# Patient Record
Sex: Male | Born: 1955 | Race: White | Hispanic: No | Marital: Married | Smoking: Former smoker
Health system: Southern US, Community
[De-identification: ages and names within clinical notes are randomized; demographics above are authoritative.]

## PROBLEM LIST (undated history)

## (undated) DIAGNOSIS — M7042 Prepatellar bursitis, left knee: Secondary | ICD-10-CM

## (undated) DIAGNOSIS — C801 Malignant (primary) neoplasm, unspecified: Secondary | ICD-10-CM

## (undated) HISTORY — PX: URETHRAL STRICTURE DILATATION: SHX477

## (undated) HISTORY — DX: Prepatellar bursitis, left knee: M70.42

---

## 1988-05-18 HISTORY — PX: INGUINAL HERNIA REPAIR: SHX194

## 2010-05-18 HISTORY — PX: OTHER SURGICAL HISTORY: SHX169

## 2011-02-12 LAB — HM COLONOSCOPY

## 2013-04-23 ENCOUNTER — Inpatient Hospital Stay: Payer: Self-pay | Admitting: Surgery

## 2013-04-23 LAB — COMPREHENSIVE METABOLIC PANEL
Albumin: 3.8 g/dL (ref 3.4–5.0)
Anion Gap: 10 (ref 7–16)
BUN: 18 mg/dL (ref 7–18)
Bilirubin,Total: 0.6 mg/dL (ref 0.2–1.0)
Co2: 22 mmol/L (ref 21–32)
EGFR (Non-African Amer.): >60
Glucose: 120 mg/dL — ABNORMAL HIGH (ref 65–99)
Osmolality: 277 (ref 275–301)
Potassium: 3.8 mmol/L (ref 3.5–5.1)
Total Protein: 7.5 g/dL (ref 6.4–8.2)

## 2013-04-23 LAB — CBC
HCT: 37.4 % — ABNORMAL LOW (ref 40.0–52.0)
HGB: 13.1 g/dL (ref 13.0–18.0)
MCH: 33.5 pg (ref 26.0–34.0)
RBC: 3.9 10*6/uL — ABNORMAL LOW (ref 4.40–5.90)
RDW: 12.6 % (ref 11.5–14.5)
WBC: 8 10*3/uL (ref 3.8–10.6)

## 2013-04-23 LAB — PRO B NATRIURETIC PEPTIDE: B-Type Natriuretic Peptide: 136 pg/mL — ABNORMAL HIGH (ref 0–125)

## 2013-05-03 ENCOUNTER — Ambulatory Visit: Payer: Self-pay | Admitting: Surgery

## 2014-09-07 NOTE — H&P (Signed)
PATIENT NAME:  James [name], James [name] MR#:  [id number] DATE OF BIRTH:  [birth year]  DATE OF ADMISSION:  04/23/2013  CHIEF COMPLAINT: A chest pain.   HISTORY OF PRESENT ILLNESS: This is a patient with a history of chest pain. He woke up with it on Thursday morning, three days ago. He was perfectly fine the day before. Had been moving some sheetrock the day before, but he does that typically and had no history of trauma on the work site that day before. He has had shortness of breath and especially right pain in the chest.   A chest tube was placed by Dr. Jasmine December Emergency Room. I was asked to see seven admit the patient for continued care. He has a well expanded lung on subsequent x-rays.   PAST MEDICAL HISTORY: None.   PAST SURGICAL HISTORY: Inguinal hernia repair with recurrence and laparoscopic approach and then in a urethral stricture repair.   ALLERGIES: None.   MEDICATIONS: Aspirin daily.   FAMILY HISTORY: Noncontributory.   SOCIAL HISTORY: The patient smokes on occasion. He drinks on occasion and works as a Geographical information systems officer, Animal nutritionist.   REVIEW OF SYSTEMS: A 10 system review is performed and negative with the exception of that mentioned in the history of present illness.   PHYSICAL EXAMINATION: GENERAL: Healthy, somewhat uncomfortable-appearing male patient. He has an obvious right chest tube in place.  VITAL SIGNS: Temperature of 97.7. Pulse of 80, respirations 18, blood pressure 149/94 97% room air sat. Pain scale of zero, is recorded, but he states that he is having considerably more pain than that.   HEENT: No scleral icterus.  NECK: No palpable neck nodes.  CHEST: Clear to auscultation. Bilateral breath sounds are noted.  CARDIAC: Regular rate and rhythm.  ABDOMEN: Soft and nontender.  EXTREMITIES: Without edema.  NEUROLOGIC: Grossly intact.  INTEGUMENT: No jaundice. A right chest tube is present with no obvious air leak.   Chest x-rays are reviewed  showing pigtail catheter on the right side, in good position with good expansion of the lung following obvious right pneumothorax. Electrolytes are within normal limits as is the hemogram.   ASSESSMENT AND PLAN: This is a patient with a right pneumothorax. He has no other medical problems. He is re-expanded with pigtail catheter placed by Dr. Jasmine December. He will be admitted to the hospital and kept on -20 cm of suction and an a chest x-ray will be ordered for the morning at which time his chest tube will likely be able to be removed and he will be discharged, at least anticipated discharge. This was discussed with the patient and his wife. They understood and agreed to proceed.     ____________________________ Jerrol Banana Burt Knack, MD rec:cc D: 04/23/2013 16:10:42 ET T: 04/23/2013 18:33:38 ET JOB#: [id number]  cc: Jerrol Banana. Burt Knack, MD, <Dictator> Florene Glen MD ELECTRONICALLY SIGNED 04/27/2013 12:39

## 2015-07-31 ENCOUNTER — Ambulatory Visit (INDEPENDENT_AMBULATORY_CARE_PROVIDER_SITE_OTHER): Payer: BC Managed Care – PPO | Admitting: Family Medicine

## 2015-07-31 ENCOUNTER — Encounter: Payer: Self-pay | Admitting: Family Medicine

## 2015-07-31 VITALS — BP 131/87 | HR 74 | Temp 96.9°F | Ht 67.7 in | Wt 157.0 lb

## 2015-07-31 DIAGNOSIS — D229 Melanocytic nevi, unspecified: Secondary | ICD-10-CM

## 2015-07-31 DIAGNOSIS — Z1159 Encounter for screening for other viral diseases: Secondary | ICD-10-CM

## 2015-07-31 DIAGNOSIS — Z20820 Contact with and (suspected) exposure to varicella: Secondary | ICD-10-CM

## 2015-07-31 DIAGNOSIS — Z0000 Encounter for general adult medical examination without abnormal findings: Secondary | ICD-10-CM | POA: Diagnosis not present

## 2015-07-31 LAB — UA/M W/RFLX CULTURE, ROUTINE
BILIRUBIN UA: NEGATIVE
GLUCOSE, UA: NEGATIVE
Ketones, UA: NEGATIVE
Leukocytes, UA: NEGATIVE
Nitrite, UA: NEGATIVE
PH UA: 6.5 (ref 5.0–7.5)
PROTEIN UA: NEGATIVE
RBC UA: NEGATIVE
SPEC GRAV UA: 1.02 (ref 1.005–1.030)
UUROB: 0.2 mg/dL (ref 0.2–1.0)

## 2015-07-31 NOTE — Progress Notes (Signed)
BP 131/87 mmHg  Pulse 74  Temp(Src) 96.9 F (36.1 C)  Ht 5' 7.7" (1.72 m)  Wt 157 lb (71.215 kg)  BMI 24.07 kg/m2  SpO2 98%   Subjective:    Patient ID: James [name], male    DOB: 1956-01-31, 60 y.o.   MRN: [id number]  HPI: James [name] is a 60 y.o. male presenting on 07/31/2015 for comprehensive medical examination. Current medical complaints include:  SKIN LESION- Has had a mole on the side of his scrotum for year, but it has been cracking and bleeding in the last month or so and giving a bad smell. Duration: 30 years Location: side of his scrotum Painful: yes Itching: no Onset: gradual Context: cracking and bleeding Associated signs and symptoms: none History of skin cancer: no History of precancerous skin lesions: no Family history of skin cancer: no  He currently lives with: Interim Problems from his last visit: no  Depression Screen done today and results listed below:  Depression screen [facility] 2/9 07/31/2015 07/31/2015  Decreased Interest 0 0  Down, Depressed, Hopeless 0 0  PHQ - 2 Score 0 0    The patient does not have a history of falls. I did not complete a risk assessment for falls. A plan of care for falls was not documented.   Past Medical History:  History reviewed. No pertinent past medical history.  Surgical History:  Past Surgical History  Procedure Laterality Date  . Hernia repair      X 2  . Urethral stricture dilatation      Medications:  No current outpatient prescriptions on file prior to visit.   No current facility-administered medications on file prior to visit.    Allergies:  No Known Allergies  Social History:  Social History   Social History  . Marital Status: Married    Spouse Name: N/A  . Number of Children: N/A  . Years of Education: N/A   Occupational History  . Not on file.   Social History Main Topics  . Smoking status: Former Smoker    Quit date: 03/18/2012  . Smokeless tobacco: Never Used  . Alcohol Use: Yes   Comment: Socially  . Drug Use: Yes    Special: Marijuana  . Sexual Activity: Not Currently   Other Topics Concern  . Not on file   Social History Narrative   History  Smoking status  . Former Smoker  . Quit date: 03/18/2012  Smokeless tobacco  . Never Used   History  Alcohol Use  . Yes    Comment: Socially    Family History:  History reviewed. No pertinent family history.  Past medical history, surgical history, medications, allergies, family history and social history reviewed with patient today and changes made to appropriate areas of the chart.   Review of Systems  Constitutional: Negative.   HENT: Negative.   Eyes: Negative.   Respiratory: Negative.   Cardiovascular: Negative.   Gastrointestinal: Negative.   Genitourinary: Negative.        Slightly weaker stream  Musculoskeletal: Negative.   Skin: Negative.   Neurological: Negative.   Endo/Heme/Allergies: Negative for environmental allergies and polydipsia. Bruises/bleeds easily.  Psychiatric/Behavioral: Negative.     All other ROS negative except what is listed above and in the HPI.      Objective:    BP 131/87 mmHg  Pulse 74  Temp(Src) 96.9 F (36.1 C)  Ht 5' 7.7" (1.72 m)  Wt 157 lb (71.215 kg)  BMI 24.07 kg/m2  SpO2  98%  Wt Readings from Last 3 Encounters:  07/31/15 157 lb (71.215 kg)  07/30/15 160 lb (72.576 kg)    Physical Exam  Constitutional: He is oriented to person, place, and time. He appears well-developed and well-nourished. No distress.  HENT:  Head: Normocephalic and atraumatic.  Right Ear: Hearing, tympanic membrane, external ear and ear canal normal.  Left Ear: Hearing, tympanic membrane, external ear and ear canal normal.  Nose: Nose normal.  Mouth/Throat: Uvula is midline, oropharynx is clear and moist and mucous membranes are normal. No oropharyngeal exudate.  Eyes: Conjunctivae, EOM and lids are normal. Pupils are equal, round, and reactive to light. Right eye exhibits no  discharge. Left eye exhibits no discharge. No scleral icterus.  Neck: Normal range of motion. Neck supple. No JVD present. No tracheal deviation present. No thyromegaly present.  Cardiovascular: Normal rate, regular rhythm, normal heart sounds and intact distal pulses.  Exam reveals no gallop and no friction rub.   No murmur heard. Pulmonary/Chest: Effort normal and breath sounds normal. No stridor. No respiratory distress. He has no wheezes. He has no rales. He exhibits no tenderness.  Abdominal: Soft. Bowel sounds are normal. He exhibits no distension and no mass. There is no tenderness. There is no rebound and no guarding. Hernia confirmed negative in the right inguinal area and confirmed negative in the left inguinal area.  Genitourinary: Testes normal and penis normal. Uncircumcised. No penile tenderness.  Rectal exam deferred  Musculoskeletal: Normal range of motion. He exhibits no edema or tenderness.  Lymphadenopathy:    He has no cervical adenopathy.       Right: No inguinal adenopathy present.       Left: No inguinal adenopathy present.  Neurological: He is alert and oriented to person, place, and time. He has normal reflexes. He displays normal reflexes. No cranial nerve deficit. He exhibits normal muscle tone. Coordination normal.  Skin: Skin is warm, dry and intact. No rash noted. He is not diaphoretic. No erythema. No pallor.     Psychiatric: He has a normal mood and affect. His speech is normal and behavior is normal. Judgment and thought content normal. Cognition and memory are normal.  Nursing note and vitals reviewed.   Results for orders placed or performed in visit on 07/31/15  HM COLONOSCOPY  Result Value Ref Range   HM Colonoscopy Repeat in 10 years- [place]:   Problem List Items Addressed This Visit    None    Visit Diagnoses    Routine general medical examination at a health care facility    -  Primary    Refuses vaccines.  Screening labs checked today. Continue diet and exercise. Continue to monitor.     Relevant Orders    CBC with Differential/Platelet    Comprehensive metabolic panel    Lipid Panel w/o Chol/HDL Ratio    PSA    TSH    UA/M w/rflx Culture, Routine    Varicella zoster antibody, IgG    Need for hepatitis C screening test        Screening labs checked today. Await results.    Relevant Orders    Hepatitis C Antibody    Exposure to varicella        Not sure if he ever had it. Will check titre and vaccinate if needed.     Relevant Orders    Varicella zoster antibody, IgG    Nevus  Large and starting to crack and bleed. Wants removal on the R side of his scrotum- referral to dermatology made today.    Relevant Orders    Ambulatory referral to Dermatology        LABORATORY TESTING:  Health maintenance labs ordered today as discussed above.   The natural history of prostate cancer and ongoing controversy regarding screening and potential treatment outcomes of prostate cancer has been discussed with the patient. The meaning of a false positive PSA and a false negative PSA has been discussed. He indicates understanding of the limitations of this screening test and wishes to proceed with screening PSA testing.   IMMUNIZATIONS:   - Tdap: Tetanus vaccination status reviewed: Refused. - Influenza: Refused - Pneumovax: Refused - Prevnar: Not applicable - Zostavax vaccine: Refused  SCREENING: - Colonoscopy: Up to date  Discussed with patient purpose of the colonoscopy is to detect colon cancer at curable precancerous or early stages   PATIENT COUNSELING:    Sexuality: Discussed sexually transmitted diseases, partner selection, use of condoms, avoidance of unintended pregnancy  and contraceptive alternatives.   Advised to avoid cigarette smoking.  I discussed with the patient that most people either abstain from alcohol or drink within safe limits (<=14/week and <=4 drinks/occasion  for males, <=7/weeks and <= 3 drinks/occasion for females) and that the risk for alcohol disorders and other health effects rises proportionally with the number of drinks per week and how often a drinker exceeds daily limits.  Discussed cessation/primary prevention of drug use and availability of treatment for abuse.   Diet: Encouraged to adjust caloric intake to maintain  or achieve ideal body weight, to reduce intake of dietary saturated fat and total fat, to limit sodium intake by avoiding high sodium foods and not adding table salt, and to maintain adequate dietary potassium and calcium preferably from fresh fruits, vegetables, and low-fat dairy products.    stressed the importance of regular exercise  Injury prevention: Discussed safety belts, safety helmets, smoke detector, smoking near bedding or upholstery.   Dental health: Discussed importance of regular tooth brushing, flossing, and dental visits.   Follow up plan: NEXT PREVENTATIVE PHYSICAL DUE IN 1 YEAR. Return in about 1 year (around 07/30/2016) for Physical.

## 2015-07-31 NOTE — Patient Instructions (Signed)

## 2015-08-01 ENCOUNTER — Encounter: Payer: Self-pay | Admitting: Family Medicine

## 2015-08-01 ENCOUNTER — Telehealth: Payer: Self-pay | Admitting: Family Medicine

## 2015-08-01 DIAGNOSIS — Z125 Encounter for screening for malignant neoplasm of prostate: Secondary | ICD-10-CM

## 2015-08-01 LAB — CBC WITH DIFFERENTIAL/PLATELET
Basophils Absolute: 0 10*3/uL (ref 0.0–0.2)
Basos: 0 %
EOS (ABSOLUTE): 0.3 10*3/uL (ref 0.0–0.4)
Eos: 5 %
Hematocrit: 37.9 % (ref 37.5–51.0)
Hemoglobin: 12.9 g/dL (ref 12.6–17.7)
Immature Grans (Abs): 0 10*3/uL (ref 0.0–0.1)
Immature Granulocytes: 0 %
Lymphocytes Absolute: 1.3 10*3/uL (ref 0.7–3.1)
Lymphs: 23 %
MCH: 32.8 pg (ref 26.6–33.0)
MCHC: 34 g/dL (ref 31.5–35.7)
MCV: 96 fL (ref 79–97)
Monocytes Absolute: 0.5 10*3/uL (ref 0.1–0.9)
Monocytes: 8 %
Neutrophils Absolute: 3.8 10*3/uL (ref 1.4–7.0)
Neutrophils: 64 %
Platelets: 256 10*3/uL (ref 150–379)
RBC: 3.93 x10E6/uL — ABNORMAL LOW (ref 4.14–5.80)
RDW: 13.6 % (ref 12.3–15.4)
WBC: 5.9 10*3/uL (ref 3.4–10.8)

## 2015-08-01 LAB — VARICELLA ZOSTER ANTIBODY, IGG

## 2015-08-01 LAB — COMPREHENSIVE METABOLIC PANEL
ALBUMIN: 4.3 g/dL (ref 3.6–4.8)
ALK PHOS: 62 IU/L (ref 39–117)
ALT: 12 IU/L (ref 0–44)
AST: 16 IU/L (ref 0–40)
Albumin/Globulin Ratio: 1.9 (ref 1.2–2.2)
BILIRUBIN TOTAL: 0.5 mg/dL (ref 0.0–1.2)
BUN/Creatinine Ratio: 14 (ref 10–22)
BUN: 15 mg/dL (ref 8–27)
CHLORIDE: 103 mmol/L (ref 96–106)
CO2: 24 mmol/L (ref 18–29)
CREATININE: 1.06 mg/dL (ref 0.76–1.27)
Calcium: 9.9 mg/dL (ref 8.6–10.2)
GFR calc Af Amer: 87 mL/min/{1.73_m2} (ref >59)
GFR calc non Af Amer: 75 mL/min/{1.73_m2} (ref >59)
GLUCOSE: 86 mg/dL (ref 65–99)
Globulin, Total: 2.3 g/dL (ref 1.5–4.5)
Potassium: 5.2 mmol/L (ref 3.5–5.2)
Sodium: 141 mmol/L (ref 134–144)
Total Protein: 6.6 g/dL (ref 6.0–8.5)

## 2015-08-01 LAB — LIPID PANEL W/O CHOL/HDL RATIO
Cholesterol, Total: 212 mg/dL — ABNORMAL HIGH (ref 100–199)
HDL: 61 mg/dL
LDL Calculated: 134 mg/dL — ABNORMAL HIGH (ref 0–99)
Triglycerides: 86 mg/dL (ref 0–149)
VLDL Cholesterol Cal: 17 mg/dL (ref 5–40)

## 2015-08-01 LAB — PSA: Prostate Specific Ag, Serum: 2.3 ng/mL (ref 0.0–4.0)

## 2015-08-01 LAB — TSH: TSH: 2.57 u[IU]/mL (ref 0.450–4.500)

## 2015-08-01 LAB — HEPATITIS C ANTIBODY: Hep C Virus Ab: <0.1 s/co ratio (ref 0.0–0.9)

## 2015-08-01 NOTE — Telephone Encounter (Signed)
Called to give James [name] the results of his blood work. Everything looks good, but his prostate marker went from 1.5 to 2.3 over the course of a year. It's still in the normal range, but because it jumped up so much I'd like to check it in 6 months rather and 12. He doesn't need an appointment with me, just a lab visit. He also HAS had chicken pox, so if he does want the shingles vaccine, he can get it, but he doesn't need to be vaccinated against chicken pox. Everything else looks good and I'm sending the results to him in a letter. OK to give him this message if he calls.

## 2015-08-02 NOTE — Telephone Encounter (Signed)
Appointment scheduled, patient notified.

## 2015-08-02 NOTE — Telephone Encounter (Signed)
Pt called in and would a call back regarding his chicken pox vaccine.

## 2015-08-02 NOTE — Telephone Encounter (Signed)
Please call patient

## 2015-08-02 NOTE — Telephone Encounter (Signed)
Called and spoke to patient. Will have follow up PSA in 6 months. Please book appointment and let patient know when it will be. Thanks!

## 2015-11-26 ENCOUNTER — Ambulatory Visit (INDEPENDENT_AMBULATORY_CARE_PROVIDER_SITE_OTHER): Payer: BC Managed Care – PPO | Admitting: Family Medicine

## 2015-11-26 ENCOUNTER — Encounter: Payer: Self-pay | Admitting: Family Medicine

## 2015-11-26 VITALS — BP 137/87 | HR 68 | Temp 98.0°F | Wt 155.0 lb

## 2015-11-26 DIAGNOSIS — T8579XA Infection and inflammatory reaction due to other internal prosthetic devices, implants and grafts, initial encounter: Secondary | ICD-10-CM | POA: Diagnosis not present

## 2015-11-26 DIAGNOSIS — IMO0001 Reserved for inherently not codable concepts without codable children: Secondary | ICD-10-CM

## 2015-11-26 NOTE — Progress Notes (Signed)
BP 137/87 mmHg  Pulse 68  Temp(Src) 98 F (36.7 C)  Wt 155 lb (70.308 kg)  SpO2 98%   Subjective:    Patient ID: James [name], male    DOB: [birth year], 60 y.o.   MRN: [id number]  HPI: James [name] is a 60 y.o. male  Chief Complaint  Patient presents with  . low abdominal pain    Patient states that he is having pain in his lower abdomen where he had his hernia surgery which has a mesh in place. This has had to be repaired alreay, he is unsure if there is another problem with the mesh   Lower Abdominal Pain- had laproscopic surgery in the 1990s, this was due to a failed previous hernia and he had it repaired Duration: couple of weeks Location: Left groin Painful: yes Discomfort: yes Bulge: no Quality:  stretching, sharp and aching Onset: sudden 2-3 weeks ago Severity: 6/10 when he moves a certain way Context: fluctuating Aggravating factors: movements  Relevant past medical, surgical, family and social history reviewed and updated as indicated. Interim medical history since our last visit reviewed. Allergies and medications reviewed and updated.  Review of Systems  Constitutional: Negative.   Respiratory: Negative.   Cardiovascular: Negative.   Gastrointestinal: Positive for abdominal pain. Negative for nausea, vomiting, diarrhea, constipation, blood in stool, abdominal distention, anal bleeding and rectal pain.  Genitourinary: Negative for dysuria, urgency, frequency, hematuria, flank pain, decreased urine volume, discharge, penile swelling, scrotal swelling, enuresis, difficulty urinating, genital sores, penile pain and testicular pain.    Per HPI unless specifically indicated above     Objective:    BP 137/87 mmHg  Pulse 68  Temp(Src) 98 F (36.7 C)  Wt 155 lb (70.308 kg)  SpO2 98%  Wt Readings from Last 3 Encounters:  11/26/15 155 lb (70.308 kg)  07/31/15 157 lb (71.215 kg)  07/30/15 160 lb (72.576 kg)    Physical Exam  Constitutional: He is oriented to  person, place, and time. He appears well-developed and well-nourished. No distress.  HENT:  Head: Normocephalic and atraumatic.  Right Ear: Hearing normal.  Left Ear: Hearing normal.  Nose: Nose normal.  Eyes: Conjunctivae and lids are normal. Right eye exhibits no discharge. Left eye exhibits no discharge. No scleral icterus.  Pulmonary/Chest: Effort normal. No respiratory distress.  Abdominal: Hernia confirmed negative in the right inguinal area and confirmed negative in the left inguinal area.  Genitourinary: Testes normal and penis normal. Cremasteric reflex is present. Right testis shows no mass, no swelling and no tenderness. Right testis is descended. Cremasteric reflex is not absent on the right side. Left testis shows no mass, no swelling and no tenderness. Left testis is descended. Cremasteric reflex is not absent on the left side. Uncircumcised.  Musculoskeletal: Normal range of motion.  Neurological: He is alert and oriented to person, place, and time.  Skin: Skin is warm, dry and intact. No rash noted. No erythema. No pallor.  Psychiatric: He has a normal mood and affect. His speech is normal and behavior is normal. Judgment and thought content normal. Cognition and memory are normal.  Nursing note and vitals reviewed.   Results for orders placed or performed in visit on 07/31/15  CBC with Differential/Platelet  Result Value Ref Range   WBC 5.9 3.4 - 10.8 x10E3/uL   RBC 3.93 (L) 4.14 - 5.80 x10E6/uL   Hemoglobin 12.9 12.6 - 17.7 g/dL   Hematocrit 37.9 37.5 - 51.0 %   MCV 96 79 - 97  fL   MCH 32.8 26.6 - 33.0 pg   MCHC 34.0 31.5 - 35.7 g/dL   RDW 13.6 12.3 - 15.4 %   Platelets 256 150 - 379 x10E3/uL   Neutrophils 64 %   Lymphs 23 %   Monocytes 8 %   Eos 5 %   Basos 0 %   Neutrophils Absolute 3.8 1.4 - 7.0 x10E3/uL   Lymphocytes Absolute 1.3 0.7 - 3.1 x10E3/uL   Monocytes Absolute 0.5 0.1 - 0.9 x10E3/uL   EOS (ABSOLUTE) 0.3 0.0 - 0.4 x10E3/uL   Basophils Absolute 0.0  0.0 - 0.2 x10E3/uL   Immature Granulocytes 0 %   Immature Grans (Abs) 0.0 0.0 - 0.1 x10E3/uL  Comprehensive metabolic panel  Result Value Ref Range   Glucose 86 65 - 99 mg/dL   BUN 15 8 - 27 mg/dL   Creatinine, Ser 1.06 0.76 - 1.27 mg/dL   GFR calc non Af Amer 75 >59 mL/min/1.73   GFR calc Af Amer 87 >59 mL/min/1.73   BUN/Creatinine Ratio 14 10 - 22   Sodium 141 134 - 144 mmol/L   Potassium 5.2 3.5 - 5.2 mmol/L   Chloride 103 96 - 106 mmol/L   CO2 24 18 - 29 mmol/L   Calcium 9.9 8.6 - 10.2 mg/dL   Total Protein 6.6 6.0 - 8.5 g/dL   Albumin 4.3 3.6 - 4.8 g/dL   Globulin, Total 2.3 1.5 - 4.5 g/dL   Albumin/Globulin Ratio 1.9 1.2 - 2.2   Bilirubin Total 0.5 0.0 - 1.2 mg/dL   Alkaline Phosphatase 62 39 - 117 IU/L   AST 16 0 - 40 IU/L   ALT 12 0 - 44 IU/L  Lipid Panel w/o Chol/HDL Ratio  Result Value Ref Range   Cholesterol, Total 212 (H) 100 - 199 mg/dL   Triglycerides 86 0 - 149 mg/dL   HDL 61 >39 mg/dL   VLDL Cholesterol Cal 17 5 - 40 mg/dL   LDL Calculated 134 (H) 0 - 99 mg/dL  PSA  Result Value Ref Range   Prostate Specific Ag, Serum 2.3 0.0 - 4.0 ng/mL  TSH  Result Value Ref Range   TSH 2.570 0.450 - 4.500 uIU/mL  UA/M w/rflx Culture, Routine  Result Value Ref Range   Specific Gravity, UA 1.020 1.005 - 1.030   pH, UA 6.5 5.0 - 7.5   Color, UA Yellow Yellow   Appearance Ur Clear Clear   Leukocytes, UA Negative Negative   Protein, UA Negative Negative/Trace   Glucose, UA Negative Negative   Ketones, UA Negative Negative   RBC, UA Negative Negative   Bilirubin, UA Negative Negative   Urobilinogen, Ur 0.2 0.2 - 1.0 mg/dL   Nitrite, UA Negative Negative  Hepatitis C Antibody  Result Value Ref Range   Hep C Virus Ab CANCELED s/co ratio  Varicella zoster antibody, IgG  Result Value Ref Range   Varicella zoster IgG CANCELED index  Hepatitis C antibody  Result Value Ref Range   Hep C Virus Ab <0.1 0.0 - 0.9 s/co ratio  Varicella zoster antibody, IgG  Result Value  Ref Range   Varicella zoster IgG >4000 Immune >165 index  HM COLONOSCOPY  Result Value Ref Range   HM Colonoscopy Repeat in 10 years- [facility]       Assessment & Plan:   Problem List Items Addressed This Visit    None    Visit Diagnoses    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 No sign of hernia. Will get him back in to see the surgeon. Call with any concerns.     Relevant Orders    Ambulatory referral to General Surgery        Follow up plan: Return 3-4 months.

## 2015-12-18 ENCOUNTER — Ambulatory Visit: Payer: BC Managed Care – PPO | Admitting: Family Medicine

## 2016-01-14 ENCOUNTER — Other Ambulatory Visit
Admission: RE | Admit: 2016-01-14 | Discharge: 2016-01-14 | Disposition: A | Discharge status: Home / Self Care | Payer: BC Managed Care – PPO | Source: Ambulatory Visit | Attending: Surgery | Admitting: Surgery

## 2016-01-14 ENCOUNTER — Ambulatory Visit (INDEPENDENT_AMBULATORY_CARE_PROVIDER_SITE_OTHER): Payer: BC Managed Care – PPO | Admitting: Surgery

## 2016-01-14 ENCOUNTER — Encounter: Payer: Self-pay | Admitting: Surgery

## 2016-01-14 VITALS — BP 145/88 | HR 68 | Temp 98.5°F | Ht 67.0 in | Wt 155.0 lb

## 2016-01-14 DIAGNOSIS — R1031 Right lower quadrant pain: Secondary | ICD-10-CM

## 2016-01-14 DIAGNOSIS — K4091 Unilateral inguinal hernia, without obstruction or gangrene, recurrent: Secondary | ICD-10-CM

## 2016-01-14 DIAGNOSIS — R1030 Lower abdominal pain, unspecified: Secondary | ICD-10-CM | POA: Diagnosis present

## 2016-01-14 LAB — CREATININE, SERUM: Creatinine, Ser: 1.04 mg/dL (ref 0.61–1.24)

## 2016-01-14 LAB — BUN: BUN: 19 mg/dL (ref 6–20)

## 2016-01-14 NOTE — Patient Instructions (Signed)
We have scheduled a CT scan on 01/28/16 '@9'$ :00 am. [address] Lorina Rabon [place] Please see your follow up appointment listed below.  We need you to go to the lab today. Please call our office if you have questions or concerns.

## 2016-01-14 NOTE — Progress Notes (Signed)
Surgical Consultation  01/14/2016  James [name] is an 60 y.o. male.   CC this patient with bilateral groin pain starting 6 months ago  HPI: This patient had bilateral laparoscopic hernias repaired back in the 90s at [facility] and had a prompt recurrence and he thinks that recurrence was on the left side requiring an open procedure mesh was utilized. Its to both sides stating that he has had pain for 6 months worsen with difficult bowel movements he is not frequently constipated he has had a colonoscopy within the last year and has no other problems no nausea vomiting fevers or chills and no weight loss.  History reviewed. No pertinent past medical history.  Past Surgical History:  Procedure Laterality Date  . collapsed lung  2012  . HERNIA REPAIR     X 2 with mesh  . INGUINAL HERNIA REPAIR  1990  . URETHRAL STRICTURE DILATATION      History reviewed. No pertinent family history.  Social History:  reports that he quit smoking about 3 years ago. He has never used smokeless tobacco. He reports that he drinks alcohol. He reports that he uses drugs, including Marijuana.  Allergies: No Known Allergies  Medications reviewed.   Review of Systems:   Review of Systems  Constitutional: Negative.   HENT: Negative.   Eyes: Negative.   Respiratory: Negative.   Cardiovascular: Negative.   Gastrointestinal: Positive for abdominal pain. Negative for blood in stool, constipation, diarrhea, heartburn, melena, nausea and vomiting.  Genitourinary: Negative for dysuria and urgency.  Musculoskeletal: Negative.   Skin: Negative.   Neurological: Negative.   Endo/Heme/Allergies: Negative.   Psychiatric/Behavioral: Negative.      Physical Exam:  BP (!) 145/88 (BP Location: Right Arm, Patient Position: Sitting, Cuff Size: Normal)   Pulse 68   Temp 98.5 F (36.9 C) (Oral)   Ht '5\' 7"'$  (1.702 m)   Wt 155 lb (70.3 kg)   BMI 24.28 kg/m   Physical Exam  Constitutional: He is oriented to  person, place, and time and well-developed, well-nourished, and in no distress. No distress.  HENT:  Head: Normocephalic and atraumatic.  Eyes: Right eye exhibits no discharge. Left eye exhibits no discharge. No scleral icterus.  Neck: Normal range of motion.  Cardiovascular: Normal rate, regular rhythm and normal heart sounds.   Pulmonary/Chest: Effort normal and breath sounds normal. No respiratory distress. He has no wheezes. He has no rales.  Abdominal: Soft. He exhibits no distension. There is no tenderness. There is no rebound and no guarding.  Genitourinary: Penis normal. No discharge found.  Genitourinary Comments: Patient examined standing supine and with Valsalva. There is a right-sided groin scar and laparoscopy scars this suggests that the right side was a recurrence that was repaired at [facility]. He clearly has a left side recurrence but not so clearly on the right side that has had 2 surgeries. Her normal  Musculoskeletal: Normal range of motion. He exhibits no edema or tenderness.  Lymphadenopathy:    He has no cervical adenopathy.  Neurological: He is alert and oriented to person, place, and time.  Skin: Skin is warm and dry. No rash noted. He is not diaphoretic. No erythema.  Psychiatric: Mood and affect normal.  Vitals reviewed.     No results found for this or any previous visit (from the past 48 hour(s)). No results found.  Assessment/Plan:  This patient is at a complex surgical history including laparoscopic bilateral inguinal hernias repaired followed by a prompt recurrence that  was repaired open. He thought it was on the left but there is clearly an open scar on the right.  By exam he has a clear and obvious recurrence on the left side and not so clear and obvious on the right but I suspect he does have a recurrence on the right as well.  My plan would be for a CT scan to assess the right side but he will require surgery and will likely require a transabdominal  laparoscopic repair. This will be discussed with him in a later date once we have the CT scan.  Florene Glen, MD, FACS

## 2016-01-16 ENCOUNTER — Telehealth: Payer: Self-pay

## 2016-01-16 NOTE — Telephone Encounter (Signed)
Notified patient of appointments listed below. Patient verbalized understanding.  Ct scan- 01/28/16 @ 9:00 am [facility]  [address]. Lorina Rabon [place]  Dr.Cooper on 02/17/16 @ 10:45 am

## 2016-01-22 ENCOUNTER — Ambulatory Visit: Payer: Self-pay

## 2016-01-28 ENCOUNTER — Ambulatory Visit
Admission: RE | Admit: 2016-01-28 | Discharge: 2016-01-28 | Disposition: A | Discharge status: Home / Self Care | Payer: BC Managed Care – PPO | Source: Ambulatory Visit | Attending: Surgery | Admitting: Surgery

## 2016-01-28 ENCOUNTER — Telehealth: Payer: Self-pay

## 2016-01-28 ENCOUNTER — Ambulatory Visit: Payer: Self-pay | Admitting: Surgery

## 2016-01-28 DIAGNOSIS — R1030 Lower abdominal pain, unspecified: Secondary | ICD-10-CM | POA: Insufficient documentation

## 2016-01-28 DIAGNOSIS — Q6589 Other specified congenital deformities of hip: Secondary | ICD-10-CM | POA: Diagnosis not present

## 2016-01-28 DIAGNOSIS — R1031 Right lower quadrant pain: Secondary | ICD-10-CM

## 2016-01-28 DIAGNOSIS — K4090 Unilateral inguinal hernia, without obstruction or gangrene, not specified as recurrent: Secondary | ICD-10-CM | POA: Diagnosis not present

## 2016-01-28 DIAGNOSIS — K5730 Diverticulosis of large intestine without perforation or abscess without bleeding: Secondary | ICD-10-CM | POA: Insufficient documentation

## 2016-01-28 DIAGNOSIS — I77811 Abdominal aortic ectasia: Secondary | ICD-10-CM | POA: Insufficient documentation

## 2016-01-28 DIAGNOSIS — N281 Cyst of kidney, acquired: Secondary | ICD-10-CM | POA: Insufficient documentation

## 2016-01-28 MED ORDER — IOPAMIDOL (ISOVUE-300) INJECTION 61%
100.0000 mL | Freq: Once | INTRAVENOUS | Status: AC | PRN
  Administered 2016-01-28: 100 mL via INTRAVENOUS

## 2016-01-28 NOTE — Telephone Encounter (Signed)
Spoke to patients wife at this time, her name is listed with Hippa and provided results of CT scan. She was asked for patient to keep follow up appointment with DR.Cooper on 02/17/16 @ 11:15 am.

## 2016-02-06 ENCOUNTER — Other Ambulatory Visit: Payer: Self-pay

## 2016-02-11 ENCOUNTER — Other Ambulatory Visit: Payer: Self-pay

## 2016-02-17 ENCOUNTER — Ambulatory Visit: Payer: Self-pay | Admitting: Surgery

## 2016-02-17 ENCOUNTER — Encounter: Payer: Self-pay | Admitting: Surgery

## 2016-02-17 ENCOUNTER — Ambulatory Visit (INDEPENDENT_AMBULATORY_CARE_PROVIDER_SITE_OTHER): Payer: BC Managed Care – PPO | Admitting: Surgery

## 2016-02-17 VITALS — BP 139/81 | HR 67 | Temp 97.7°F | Ht 67.0 in | Wt 158.0 lb

## 2016-02-17 DIAGNOSIS — K4091 Unilateral inguinal hernia, without obstruction or gangrene, recurrent: Secondary | ICD-10-CM

## 2016-02-17 NOTE — Patient Instructions (Signed)
Please call our office if you have questions or concerns.   

## 2016-02-17 NOTE — Progress Notes (Signed)
Outpatient Surgical Follow Up  02/17/2016  James [name] is an 60 y.o. male.   CC: Groin pain  HPI: This patient status post bilateral inguinal hernia repairs via laparoscopic approach probably preperitoneal. He also had a recurrence on the left which was repaired via an open procedure with mesh. This was performed elsewhere. He describes right groin pain. He has had a CT scan which shows bilateral recurrent inguinal hernias left greater than right.  No past medical history on file.  Past Surgical History:  Procedure Laterality Date  . collapsed lung  2012  . HERNIA REPAIR     X 2 with mesh  . INGUINAL HERNIA REPAIR  1990  . URETHRAL STRICTURE DILATATION      No family history on file.  Social History:  reports that he quit smoking about 3 years ago. He has never used smokeless tobacco. He reports that he drinks alcohol. He reports that he uses drugs, including Marijuana.  Allergies: No Known Allergies  Medications reviewed.   Review of Systems:   Review of Systems  Constitutional: Negative for chills, fever and weight loss.  HENT: Negative.   Eyes: Negative.   Respiratory: Negative.   Cardiovascular: Negative.   Gastrointestinal: Negative for abdominal pain, diarrhea, heartburn, nausea and vomiting.  Genitourinary: Negative.   Musculoskeletal: Negative.   Skin: Negative.   Neurological: Negative.   Endo/Heme/Allergies: Negative.   Psychiatric/Behavioral: Negative.      Physical Exam:  There were no vitals taken for this visit.  Physical Exam  Constitutional: He is oriented to person, place, and time and well-developed, well-nourished, and in no distress. No distress.  HENT:  Head: Normocephalic and atraumatic.  Eyes: Pupils are equal, round, and reactive to light. Right eye exhibits no discharge. Left eye exhibits no discharge. No scleral icterus.  Neck: Normal range of motion.  Cardiovascular: Normal rate, regular rhythm and normal heart sounds.    Pulmonary/Chest: Effort normal and breath sounds normal. No respiratory distress. He has no wheezes. He has no rales.  Abdominal: Soft. He exhibits no distension. There is no tenderness. There is no rebound.  There is a scar in the right groin suggesting the location of prior recurrent right inguinal hernia repair  Genitourinary: Penis normal. No discharge found.  Genitourinary Comments: Patient examined standing supine and with Valsalva He has bilateral inguinal hernia recurrences. There is a scar in the right groin suggesting prior recurrent inguinal hernia repair via open technique. Testicles are normal  Musculoskeletal: Normal range of motion. He exhibits no edema or tenderness.  Lymphadenopathy:    He has no cervical adenopathy.  Neurological: He is alert and oriented to person, place, and time.  Skin: Skin is warm and dry. No rash noted. He is not diaphoretic. No erythema.  Psychiatric: Mood and affect normal.  Vitals reviewed.     No results found for this or any previous visit (from the past 48 hour(s)). No results found.  Assessment/Plan:  This a patient status post laparoscopic preperitoneal repair of bilateral hernias and he has bilateral recurrences. The surgery was performed elsewhere. He had a recurrence on the left which was repaired via an open procedure. But on my exam his scar is actually on the right therefore I believe he had her to recurrences on the right as opposed to the left. Mesh was utilized. He now has right groin pain and thinks he has another recurrence. A CT scan was obtained and is been personally reviewed showing bilateral recurrences. Clinically the patient has  bilateral recurrent inguinal hernias.  I discussed with him the rationale for offering laparoscopic transabdominal repair of both inguinal hernias. He wishes to wait he states he has a lot going on with work and finances and would like to consider scheduling this after the first of the year. I  reminded him of some urgencies associated with incarcerated hernias and that he could return to see [place] at any time.  Florene Glen, MD, FACS

## 2016-03-06 ENCOUNTER — Encounter: Payer: Self-pay | Admitting: Emergency Medicine

## 2016-03-06 ENCOUNTER — Emergency Department: Payer: BC Managed Care – PPO

## 2016-03-06 ENCOUNTER — Emergency Department
Admission: EM | Admit: 2016-03-06 | Discharge: 2016-03-06 | Disposition: A | Discharge status: Home / Self Care | Payer: BC Managed Care – PPO | Attending: Emergency Medicine | Admitting: Emergency Medicine

## 2016-03-06 DIAGNOSIS — Y9389 Activity, other specified: Secondary | ICD-10-CM | POA: Insufficient documentation

## 2016-03-06 DIAGNOSIS — Z23 Encounter for immunization: Secondary | ICD-10-CM | POA: Diagnosis not present

## 2016-03-06 DIAGNOSIS — Y9289 Other specified places as the place of occurrence of the external cause: Secondary | ICD-10-CM | POA: Diagnosis not present

## 2016-03-06 DIAGNOSIS — Y990 Civilian activity done for income or pay: Secondary | ICD-10-CM | POA: Diagnosis not present

## 2016-03-06 DIAGNOSIS — W270XXA Contact with workbench tool, initial encounter: Secondary | ICD-10-CM | POA: Insufficient documentation

## 2016-03-06 DIAGNOSIS — Z87891 Personal history of nicotine dependence: Secondary | ICD-10-CM | POA: Diagnosis not present

## 2016-03-06 DIAGNOSIS — Z7982 Long term (current) use of aspirin: Secondary | ICD-10-CM | POA: Diagnosis not present

## 2016-03-06 DIAGNOSIS — S61211A Laceration without foreign body of left index finger without damage to nail, initial encounter: Secondary | ICD-10-CM | POA: Diagnosis present

## 2016-03-06 DIAGNOSIS — Z79899 Other long term (current) drug therapy: Secondary | ICD-10-CM | POA: Diagnosis not present

## 2016-03-06 MED ORDER — TETANUS-DIPHTH-ACELL PERTUSSIS 5-2.5-18.5 LF-MCG/0.5 IM SUSP
0.5000 mL | Freq: Once | INTRAMUSCULAR | Status: AC
  Administered 2016-03-06: 0.5 mL via INTRAMUSCULAR
  Filled 2016-03-06: qty 0.5

## 2016-03-06 MED ORDER — HYDROCODONE-ACETAMINOPHEN 5-325 MG PO TABS: 1.0000 | ORAL_TABLET | Freq: Four times a day (QID) | ORAL | 0 refills | Status: DC | PRN

## 2016-03-06 MED ORDER — CLINDAMYCIN HCL 300 MG PO CAPS: 300.0000 mg | ORAL_CAPSULE | Freq: Three times a day (TID) | ORAL | 0 refills | Status: DC

## 2016-03-06 NOTE — ED Triage Notes (Addendum)
Pt with laceration to left first finger. Pad of finger is missing. Happened using a skill saw. New dressing applied in triage.

## 2016-03-06 NOTE — ED Provider Notes (Signed)
[facility] Emergency Department Provider Note  ____________________________________________  Time seen: Approximately 12:21 PM  I have reviewed the triage vital signs and the nursing notes.   HISTORY  Chief Complaint Laceration    HPI James [name] is a 60 y.o. male, NAD, presents to the emergency room with 3 hour history of laceration to left index finger. 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 States that the "meat" was taken off the pad of his index finger. Had pain at the initial onset of a laceration which has dwindled. Denies numbness, weakness, tingling. Has had no fevers or chills. Has been able to bend the finger since the incident. He is uncertain of last tetanus vaccination but believes it has been within the last 10 years.     History reviewed. No pertinent past medical history.  There are no active problems to display for this patient.   Past Surgical History:  Procedure Laterality Date  . collapsed lung  2012  . HERNIA REPAIR     X 2 with mesh  . INGUINAL HERNIA REPAIR  1990  . URETHRAL STRICTURE DILATATION      Prior to Admission medications   Medication Sig Start Date End Date Taking? Authorizing Provider  aspirin 81 MG tablet Take 162 mg by mouth daily.    Historical Provider, MD  clindamycin (CLEOCIN) 300 MG capsule Take 1 capsule (300 mg total) by mouth 3 (three) times daily. 03/06/16   Nhan Qualley L Khayden Herzberg, PA-C  HYDROcodone-acetaminophen (NORCO) 5-325 MG tablet Take 1 tablet by mouth every 6 (six) hours as needed for severe pain. 03/06/16   Damico Partin L Jeovany Huitron, PA-C  Multiple Vitamin (MULTIVITAMIN) tablet Take 1 tablet by mouth daily.    Historical Provider, MD    Allergies Review of patient's allergies indicates no known allergies.  No family history on file.  Social History Social History  Substance Use Topics  . Smoking status:  Former Smoker    Quit date: 03/18/2012  . Smokeless tobacco: Never Used  . Alcohol use Yes     Comment: Socially     Review of Systems  Constitutional: No fever/chills Musculoskeletal:Positive pain in left index finger at laceration site. No decrease in ROM. No hand or wrist pain.  Skin: Positive laceration to the pad of the left index finger. No rash, redness, swelling Neurological: Negative for numbness, weakness, tingling.  ____________________________________________   PHYSICAL EXAM:  VITAL SIGNS: ED Triage Vitals  Enc Vitals Group     BP 03/06/16 1149 139/77     Pulse Rate 03/06/16 1149 68     Resp 03/06/16 1149 18     Temp 03/06/16 1149 98 F (36.7 C)     Temp Source 03/06/16 1149 Oral     SpO2 03/06/16 1149 98 %     Weight 03/06/16 1149 160 lb (72.6 kg)     Height 03/06/16 1149 '5\' 10"'$  (1.778 m)     Head Circumference --      Peak Flow --      Pain Score 03/06/16 1152 0     Pain Loc --      Pain Edu? --      Excl. in [place]? --      Constitutional: Alert and oriented. Well appearing and in no acute distress. Eyes: Conjunctivae are normal. Head: Atraumatic.  Cardiovascular:  Good peripheral circulation with 2+ pulses in the left  upper extremity. Capillary refills brisk in all distal left hand. Respiratory: Normal respiratory effort without tachypnea or retractions.  Musculoskeletal: FROM of the left index finger without pain or difficulty.  Neurologic:  Normal speech and language. No gross focal neurologic deficits are appreciated.  Skin:  Soft tissue defect laceration to the pad on the left index finger with active bleeding.  Psychiatric: Mood and affect are normal. Speech and behavior are normal. Patient exhibits appropriate insight and judgement.   ____________________________________________   LABS  None ____________________________________________  EKG  None ____________________________________________  RADIOLOGY I, Wagoner, personally viewed  and evaluated these images (plain radiographs) as part of my medical decision making, as well as reviewing the written report by the radiologist.  Dg Finger Index Left  Result Date: 03/06/2016 CLINICAL DATA:  Cut with circular saw earlier today EXAM: LEFT SECOND FINGER 2+V COMPARISON:  None. FINDINGS: Frontal, oblique, and lateral views were obtained. There is soft tissue injury to the distal aspect of the second digit with overlying bandage in this area. There is no acute fracture or dislocation. No radiopaque foreign body beyond the bandage. 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 IMPRESSION: Areas of osteoarthritic change. Soft tissue injury to the distal second digit with overlying bandage. No other radiopaque foreign body. No fracture or dislocation. Electronically Signed   By: Lowella Grip III M.D.   On: 03/06/2016 13:13    ____________________________________________    PROCEDURES  Procedure(s) performed: None   .Marland KitchenLaceration Repair Date/Time: 03/06/2016 1:32 PM Performed by: Natale Milch L Authorized by: Braxton Feathers   Consent:    Consent obtained:  Verbal   Consent given by:  Patient   Risks discussed:  Infection, need for additional repair, nerve damage, pain, poor cosmetic result, poor wound healing and vascular damage   Alternatives discussed:  No treatment Anesthesia (see MAR for exact dosages):    Anesthesia method:  None Laceration details:    Location:  Finger   Finger location:  L index finger   Length (cm):  2 (soft tissue defect) Repair type:    Repair type:  Simple Pre-procedure details:    Preparation:  Imaging obtained to evaluate for foreign bodies Exploration:    Hemostasis achieved with:  Direct pressure   Wound exploration: wound explored through full range of motion and entire depth of wound probed and visualized     Wound extent: fascia violated and vascular damage     Wound extent: no foreign  bodies/material noted, no muscle damage noted, no nerve damage noted, no tendon damage noted and no underlying fracture noted     Contaminated: no   Treatment:    Area cleansed with:  Saline   Amount of cleaning:  Standard   Foreign body removal: No foreign bodies.   Skin repair:    Repair method:  Tissue adhesive Post-procedure details:    Dressing:  Non-adherent dressing, splint for protection and bulky dressing   Patient tolerance of procedure:  Tolerated well, no immediate complications  .Splint Application Date/Time: 24/58/0998 2:18 PM Performed by: Natale Milch L Authorized by: Braxton Feathers   Consent:    Consent obtained:  Verbal   Consent given by:  Patient   Risks discussed:  Discoloration, numbness and pain   Alternatives discussed:  No treatment Pre-procedure details:    Sensation:  Normal   Skin color:  Normal Procedure details:    Laterality:  Left   Location:  Finger  Finger:  L index finger   Strapping: no     Cast type:  Finger   Splint type:  Finger   Supplies:  Aluminum splint Post-procedure details:    Pain:  Improved   Sensation:  Normal   Skin color:  Normal   Patient tolerance of procedure:  Tolerated well, no immediate complications     Medications  Tdap (BOOSTRIX) injection 0.5 mL (0.5 mLs Intramuscular Given 03/06/16 1307)     ____________________________________________   INITIAL IMPRESSION / ASSESSMENT AND PLAN / ED COURSE  Pertinent labs & imaging results that were available during my care of the patient were reviewed by me and considered in my medical decision making (see chart for details).  Clinical Course  Comment By Time  Hemostasis was achieved with application of pressure and raising the finger above heart level x 15 minutes. Tissue adhesive may now be applied to the laceration. Braxton Feathers, PA-C 10/20 1245  I spoke with Dr. Sabra Heck, on call for orthopedics, to discuss the patient's history, physical, imaging results. He  is in agreement with plan to cleanse the wound, obtain hemostasis, apply tissue adhesive, apply finger splint and the patient can follow up with Dr. Sabra Heck in his office next week.  Braxton Feathers, PA-C 10/20 1337    Patient's diagnosis is consistent with Laceration of left index finger without foreign body and without damage to nail and need for tetanus booster. Patient will be discharged home with prescriptions for clindamycin and Norco to take as directed. Patient's tetanus vaccine was updated today. Patient is to follow up with Dr. Sabra Heck in orthopedics next week for further evaluation and treatment. Patient is given ED precautions to return to the ED for any worsening or new symptoms.    ____________________________________________  FINAL CLINICAL IMPRESSION(S) / ED DIAGNOSES  Final diagnoses:  Laceration of left index finger w/o foreign body w/o damage to nail, initial encounter  Need for tetanus booster      NEW MEDICATIONS STARTED DURING THIS VISIT:  Discharge Medication List as of 03/06/2016  2:19 PM    START taking these medications   Details  clindamycin (CLEOCIN) 300 MG capsule Take 1 capsule (300 mg total) by mouth 3 (three) times daily., Starting Fri 03/06/2016, Print    HYDROcodone-acetaminophen (NORCO) 5-325 MG tablet Take 1 tablet by mouth every 6 (six) hours as needed for severe pain., Starting Fri 03/06/2016, Print            Judithe Modest Moca, PA-C 03/06/16 1445    Delman Kitten, MD 03/06/16 1635

## 2016-03-12 ENCOUNTER — Encounter: Payer: Self-pay | Admitting: Family Medicine

## 2016-03-12 ENCOUNTER — Ambulatory Visit (INDEPENDENT_AMBULATORY_CARE_PROVIDER_SITE_OTHER): Payer: BC Managed Care – PPO | Admitting: Family Medicine

## 2016-03-12 DIAGNOSIS — K4021 Bilateral inguinal hernia, without obstruction or gangrene, recurrent: Secondary | ICD-10-CM | POA: Diagnosis not present

## 2016-03-12 DIAGNOSIS — K4020 Bilateral inguinal hernia, without obstruction or gangrene, not specified as recurrent: Secondary | ICD-10-CM | POA: Insufficient documentation

## 2016-03-12 NOTE — Progress Notes (Signed)
BP 124/81 (BP Location: Left Arm, Patient Position: Sitting, Cuff Size: Normal)   Pulse 75   Temp 97.8 F (36.6 C)   Wt 157 lb (71.2 kg)   SpO2 100%   BMI 22.53 kg/m    Subjective:    Patient ID: James [name], male    DOB: March 25, 1956, 60 y.o.   MRN: [id number]  HPI: James [name] is a 60 y.o. male  Chief Complaint  Patient presents with  . Hernia   He is doing well today with no complaints. He does have bilateral hernias and is going to have surgery some time in December in order to have them fixed. Cut his finger pretty bad about a week ago and following up with Dr. Ammie Ferrier office today. He notes that he is not sure if he will need surgical clearance for his hernia repair. He has had surgery several times in the past. He has never had a problem with anesthesia. No one in his family has had a problem with anesthesia. He denies any issues with his breathing or CP. He is really feeling well. No other concerns or complaints at this time.   Relevant past medical, surgical, family and social history reviewed and updated as indicated. Interim medical history since our last visit reviewed. Allergies and medications reviewed and updated.  Review of Systems  Constitutional: Negative.   Respiratory: Negative.   Cardiovascular: Negative.   Psychiatric/Behavioral: Negative.     Per HPI unless specifically indicated above     Objective:    BP 124/81 (BP Location: Left Arm, Patient Position: Sitting, Cuff Size: Normal)   Pulse 75   Temp 97.8 F (36.6 C)   Wt 157 lb (71.2 kg)   SpO2 100%   BMI 22.53 kg/m   Wt Readings from Last 3 Encounters:  03/12/16 157 lb (71.2 kg)  03/06/16 160 lb (72.6 kg)  02/17/16 158 lb (71.7 kg)    Physical Exam  Constitutional: He is oriented to person, place, and time. He appears well-developed and well-nourished. No distress.  HENT:  Head: Normocephalic and atraumatic.  Right Ear: Hearing and external ear normal.  Left Ear: Hearing and external ear  normal.  Nose: Nose normal.  Mouth/Throat: Oropharynx is clear and moist. No oropharyngeal exudate.  Eyes: Conjunctivae, EOM and lids are normal. Pupils are equal, round, and reactive to light. Right eye exhibits no discharge. Left eye exhibits no discharge. No scleral icterus.  Neck: Normal range of motion. Neck supple. No JVD present. No tracheal deviation present. No thyromegaly present.  Cardiovascular: Normal rate, regular rhythm, normal heart sounds and intact distal pulses.  Exam reveals no gallop and no friction rub.   No murmur heard. Pulmonary/Chest: Effort normal and breath sounds normal. No stridor. No respiratory distress. He has no wheezes. He has no rales. He exhibits no tenderness.  Musculoskeletal: Normal range of motion.  Lymphadenopathy:    He has no cervical adenopathy.  Neurological: He is alert and oriented to person, place, and time.  Skin: Skin is warm, dry and intact. No rash noted. He is not diaphoretic. No erythema. No pallor.  Psychiatric: He has a normal mood and affect. His speech is normal and behavior is normal. Judgment and thought content normal. Cognition and memory are normal.  Nursing note and vitals reviewed.   Results for orders placed or performed during the hospital encounter of 01/14/16  BUN  Result Value Ref Range   BUN 19 6 - 20 mg/dL  Creatinine, serum  Result  Value Ref Range   Creatinine, Ser 1.04 0.61 - 1.24 mg/dL   GFR calc non Af Amer >60 >60 mL/min   GFR calc Af Amer >60 >60 mL/min      Assessment & Plan:   Problem List Items Addressed This Visit      Other   Bilateral inguinal hernia    Will find out if he needs clearance for surgery. This should not be an issues pending labs and EKG given no previous problems or issues within his family.        Other Visit Diagnoses   None.      Follow up plan: Return in about 6 months (around 09/10/2016) for Physical.

## 2016-03-12 NOTE — Assessment & Plan Note (Signed)
Will find out if he needs clearance for surgery. This should not be an issues pending labs and EKG given no previous problems or issues within his family.

## 2016-07-31 ENCOUNTER — Encounter: Payer: BC Managed Care – PPO | Admitting: Family Medicine

## 2016-08-10 ENCOUNTER — Encounter: Payer: Self-pay | Admitting: Family Medicine

## 2016-08-10 ENCOUNTER — Ambulatory Visit (INDEPENDENT_AMBULATORY_CARE_PROVIDER_SITE_OTHER): Payer: BC Managed Care – PPO | Admitting: Family Medicine

## 2016-08-10 VITALS — BP 124/62 | HR 72 | Temp 97.8°F | Resp 17 | Ht 68.2 in | Wt 153.0 lb

## 2016-08-10 DIAGNOSIS — Z0000 Encounter for general adult medical examination without abnormal findings: Secondary | ICD-10-CM | POA: Diagnosis not present

## 2016-08-10 DIAGNOSIS — Z125 Encounter for screening for malignant neoplasm of prostate: Secondary | ICD-10-CM | POA: Diagnosis not present

## 2016-08-10 LAB — UA/M W/RFLX CULTURE, ROUTINE
BILIRUBIN UA: NEGATIVE
GLUCOSE, UA: NEGATIVE
Ketones, UA: NEGATIVE
LEUKOCYTES UA: NEGATIVE
NITRITE UA: NEGATIVE
PROTEIN UA: NEGATIVE
RBC UA: NEGATIVE
SPEC GRAV UA: 1.025 (ref 1.005–1.030)
UUROB: 0.2 mg/dL (ref 0.2–1.0)
pH, UA: 5.5 (ref 5.0–7.5)

## 2016-08-10 LAB — MICROSCOPIC EXAMINATION
Bacteria, UA: NONE SEEN
RBC, UA: NONE SEEN /hpf (ref 0–?)
WBC, UA: NONE SEEN /hpf (ref 0–?)

## 2016-08-10 NOTE — Progress Notes (Signed)
BP 124/62   Pulse 72   Temp 97.8 F (36.6 C) (Oral)   Resp 17   Ht 5' 8.2" (1.732 m)   Wt 153 lb (69.4 kg)   SpO2 97%   BMI 23.13 kg/m    Subjective:    Patient ID: James [name], male    DOB: May 26, 1955, 61 y.o.   MRN: [id number]  HPI: James [name] is a 61 y.o. male presenting on 08/10/2016 for comprehensive medical examination. Current medical complaints include:none  He currently lives with: wife Interim Problems from his last visit: no  Depression Screen done today and results listed below:  Depression screen [facility] 2/9 08/10/2016 07/31/2015 07/31/2015  Decreased Interest 0 0 0  Down, Depressed, Hopeless 0 0 0  PHQ - 2 Score 0 0 0    Past Medical History:  No past medical history on file.  Surgical History:  Past Surgical History:  Procedure Laterality Date  . collapsed lung  2012  . HERNIA REPAIR     X 2 with mesh  . INGUINAL HERNIA REPAIR  1990  . URETHRAL STRICTURE DILATATION      Medications:  Current Outpatient Prescriptions on File Prior to Visit  Medication Sig  . aspirin 81 MG tablet Take 162 mg by mouth daily.  . Multiple Vitamin (MULTIVITAMIN) tablet Take 1 tablet by mouth daily.   No current facility-administered medications on file prior to visit.     Allergies:  No Known Allergies  Social History:  Social History   Social History  . Marital status: Married    Spouse name: N/A  . Number of children: N/A  . Years of education: N/A   Occupational History  . Not on file.   Social History Main Topics  . Smoking status: Former Smoker    Quit date: 03/18/2012  . Smokeless tobacco: Never Used  . Alcohol use Yes     Comment: Socially  . Drug use: Yes    Types: Marijuana  . Sexual activity: Not Currently   Other Topics Concern  . Not on file   Social History Narrative  . No narrative on file   History  Smoking Status  . Former Smoker  . Quit date: 03/18/2012  Smokeless Tobacco  . Never Used   History  Alcohol Use  . Yes   Comment: Socially    Family History:  No family history on file.  Past medical history, surgical history, medications, allergies, family history and social history reviewed with patient today and changes made to appropriate areas of the chart.   Review of Systems  Constitutional: Negative.   HENT: Negative.   Eyes: Negative.   Respiratory: Negative.   Cardiovascular: Negative.   Gastrointestinal: Negative.   Genitourinary: Negative.   Musculoskeletal: Negative.   Skin: Negative.   Neurological: Negative.   Endo/Heme/Allergies: Negative.   Psychiatric/Behavioral: Negative.     All other ROS negative except what is listed above and in the HPI.      Objective:    BP 124/62   Pulse 72   Temp 97.8 F (36.6 C) (Oral)   Resp 17   Ht 5' 8.2" (1.732 m)   Wt 153 lb (69.4 kg)   SpO2 97%   BMI 23.13 kg/m   Wt Readings from Last 3 Encounters:  08/10/16 153 lb (69.4 kg)  03/12/16 157 lb (71.2 kg)  03/06/16 160 lb (72.6 kg)    Physical Exam  Constitutional: He is oriented to person, place, and time. He appears  well-developed and well-nourished. No distress.  HENT:  Head: Normocephalic and atraumatic.  Right Ear: Hearing, tympanic membrane, external ear and ear canal normal.  Left Ear: Hearing, tympanic membrane, external ear and ear canal normal.  Nose: Nose normal.  Mouth/Throat: Uvula is midline, oropharynx is clear and moist and mucous membranes are normal. No oropharyngeal exudate.  Eyes: Conjunctivae, EOM and lids are normal. Pupils are equal, round, and reactive to light. Right eye exhibits no discharge. Left eye exhibits no discharge. No scleral icterus.  Neck: Normal range of motion. Neck supple. No JVD present. No tracheal deviation present. No thyromegaly present.  Cardiovascular: Normal rate, regular rhythm, normal heart sounds and intact distal pulses.  Exam reveals no gallop and no friction rub.   No murmur heard. Pulmonary/Chest: Effort normal and breath sounds  normal. No stridor. No respiratory distress. He has no wheezes. He has no rales. He exhibits no tenderness.  Abdominal: Soft. Bowel sounds are normal. He exhibits no distension and no mass. There is no tenderness. There is no rebound and no guarding. Hernia confirmed negative in the right inguinal area and confirmed negative in the left inguinal area.  Genitourinary: Testes normal and penis normal. Cremasteric reflex is present. Right testis shows no mass, no swelling and no tenderness. Right testis is descended. Cremasteric reflex is not absent on the right side. Left testis shows no mass, no swelling and no tenderness. Left testis is descended. Cremasteric reflex is not absent on the left side. Uncircumcised. No penile tenderness.  Genitourinary Comments: Prostate exam deferred at patient's request  Musculoskeletal: Normal range of motion. He exhibits no edema, tenderness or deformity.  Lymphadenopathy:    He has no cervical adenopathy.  Neurological: He is alert and oriented to person, place, and time. He has normal reflexes. He displays normal reflexes. No cranial nerve deficit. He exhibits normal muscle tone. Coordination normal.  Skin: Skin is warm, dry and intact. No rash noted. He is not diaphoretic. No erythema. No pallor.  Psychiatric: He has a normal mood and affect. His speech is normal and behavior is normal. Judgment and thought content normal. Cognition and memory are normal.  Nursing note and vitals reviewed.   Results for orders placed or performed during the hospital encounter of 01/14/16  BUN  Result Value Ref Range   BUN 19 6 - 20 mg/dL  Creatinine, serum  Result Value Ref Range   Creatinine, Ser 1.04 0.61 - 1.24 mg/dL   GFR calc non Af Amer >60 >60 mL/min   GFR calc Af Amer >60 >60 mL/min      Assessment & Plan:   Problem List Items Addressed This Visit    None    Visit Diagnoses    Routine general medical examination at a health care facility    -  Primary   Up to  date on vaccines. Screening labs checked today. Colonoscopy up to date. Continue diet and exercise. Call with any concerns.    Relevant Orders   CBC with Differential/Platelet   Comprehensive metabolic panel   Lipid Panel w/o Chol/HDL Ratio   PSA   TSH   UA/M w/rflx Culture, Routine   Screening for prostate cancer       Labs checked today. Await results.    Relevant Orders   PSA       Discussed aspirin prophylaxis for myocardial infarction prevention and decision was made to continue ASA  LABORATORY TESTING:  Health maintenance labs ordered today as discussed above.   The  natural history of prostate cancer and ongoing controversy regarding screening and potential treatment outcomes of prostate cancer has been discussed with the patient. The meaning of a false positive PSA and a false negative PSA has been discussed. He indicates understanding of the limitations of this screening test and wishes to proceed with screening PSA testing.   IMMUNIZATIONS:   - Tdap: Tetanus vaccination status reviewed: last tetanus booster within 10 years. - Influenza: Refused - Pneumovax: Refused  SCREENING: - Colonoscopy: Up to date  Discussed with patient purpose of the colonoscopy is to detect colon cancer at curable precancerous or early stages   PATIENT COUNSELING:    Sexuality: Discussed sexually transmitted diseases, partner selection, use of condoms, avoidance of unintended pregnancy  and contraceptive alternatives.   Advised to avoid cigarette smoking.  I discussed with the patient that most people either abstain from alcohol or drink within safe limits (<=14/week and <=4 drinks/occasion for males, <=7/weeks and <= 3 drinks/occasion for females) and that the risk for alcohol disorders and other health effects rises proportionally with the number of drinks per week and how often a drinker exceeds daily limits.  Discussed cessation/primary prevention of drug use and availability of treatment  for abuse.   Diet: Encouraged to adjust caloric intake to maintain  or achieve ideal body weight, to reduce intake of dietary saturated fat and total fat, to limit sodium intake by avoiding high sodium foods and not adding table salt, and to maintain adequate dietary potassium and calcium preferably from fresh fruits, vegetables, and low-fat dairy products.    stressed the importance of regular exercise  Injury prevention: Discussed safety belts, safety helmets, smoke detector, smoking near bedding or upholstery.   Dental health: Discussed importance of regular tooth brushing, flossing, and dental visits.   Follow up plan: NEXT PREVENTATIVE PHYSICAL DUE IN 1 YEAR. Return in about 1 year (around 08/10/2017) for Physical.

## 2016-08-10 NOTE — Patient Instructions (Addendum)
 Health Maintenance, Male A healthy lifestyle and preventive care is important for your health and wellness. Ask your health care provider about what schedule of regular examinations is right for you. What should I know about weight and diet?  Eat a Healthy Diet  Eat plenty of vegetables, fruits, whole grains, low-fat dairy products, and lean protein.  Do not eat a lot of foods high in solid fats, added sugars, or salt. Maintain a Healthy Weight  Regular exercise can help you achieve or maintain a healthy weight. You should:  Do at least 150 minutes of exercise each week. The exercise should increase your heart rate and make you sweat (moderate-intensity exercise).  Do strength-training exercises at least twice a week. Watch Your Levels of Cholesterol and Blood Lipids  Have your blood tested for lipids and cholesterol every 5 years starting at 63 years of age. If you are at high risk for heart disease, you should start having your blood tested when you are 63 years old. You may need to have your cholesterol levels checked more often if:  Your lipid or cholesterol levels are high.  You are older than 63 years of age.  You are at high risk for heart disease. What should I know about cancer screening? Many types of cancers can be detected early and may often be prevented. Lung Cancer  You should be screened every year for lung cancer if:  You are a current smoker who has smoked for at least 30 years.  You are a former smoker who has quit within the past 15 years.  Talk to your health care provider about your screening options, when you should start screening, and how often you should be screened. Colorectal Cancer  Routine colorectal cancer screening usually begins at 63 years of age and should be repeated every 5-10 years until you are 63 years old. You may need to be screened more often if early forms of precancerous polyps or small growths are found. Your health care provider  may recommend screening at an earlier age if you have risk factors for colon cancer.  Your health care provider may recommend using home test kits to check for hidden blood in the stool.  A small camera at the end of a tube can be used to examine your colon (sigmoidoscopy or colonoscopy). This checks for the earliest forms of colorectal cancer. Prostate and Testicular Cancer  Depending on your age and overall health, your health care provider may do certain tests to screen for prostate and testicular cancer.  Talk to your health care provider about any symptoms or concerns you have about testicular or prostate cancer. Skin Cancer  Check your skin from head to toe regularly.  Tell your health care provider about any new moles or changes in moles, especially if:  There is a change in a mole's size, shape, or color.  You have a mole that is larger than a pencil eraser.  Always use sunscreen. Apply sunscreen liberally and repeat throughout the day.  Protect yourself by wearing long sleeves, pants, a wide-brimmed hat, and sunglasses when outside. What should I know about heart disease, diabetes, and high blood pressure?  If you are 18-39 years of age, have your blood pressure checked every 3-5 years. If you are 40 years of age or older, have your blood pressure checked every year. You should have your blood pressure measured twice-once when you are at a hospital or clinic, and once when you are not at   a hospital or clinic. Record the average of the two measurements. To check your blood pressure when you are not at a hospital or clinic, you can use:  An automated blood pressure machine at a pharmacy.  A home blood pressure monitor.  Talk to your health care provider about your target blood pressure.  If you are between 45-79 years old, ask your health care provider if you should take aspirin to prevent heart disease.  Have regular diabetes screenings by checking your fasting blood sugar  level.  If you are at a normal weight and have a low risk for diabetes, have this test once every three years after the age of 45.  If you are overweight and have a high risk for diabetes, consider being tested at a younger age or more often.  A one-time screening for abdominal aortic aneurysm (AAA) by ultrasound is recommended for men aged 65-75 years who are current or former smokers. What should I know about preventing infection? Hepatitis B  If you have a higher risk for hepatitis B, you should be screened for this virus. Talk with your health care provider to find out if you are at risk for hepatitis B infection. Hepatitis C  Blood testing is recommended for:  Everyone born from 1945 through 1965.  Anyone with known risk factors for hepatitis C. Sexually Transmitted Diseases (STDs)  You should be screened each year for STDs including gonorrhea and chlamydia if:  You are sexually active and are younger than 63 years of age.  You are older than 63 years of age and your health care provider tells you that you are at risk for this type of infection.  Your sexual activity has changed since you were last screened and you are at an increased risk for chlamydia or gonorrhea. Ask your health care provider if you are at risk.  Talk with your health care provider about whether you are at high risk of being infected with HIV. Your health care provider may recommend a prescription medicine to help prevent HIV infection. What else can I do?  Schedule regular health, dental, and eye exams.  Stay current with your vaccines (immunizations).  Do not use any tobacco products, such as cigarettes, chewing tobacco, and e-cigarettes. If you need help quitting, ask your health care provider.  Limit alcohol intake to no more than 2 drinks per day. One drink equals 12 ounces of beer, 5 ounces of wine, or 1 ounces of hard liquor.  Do not use street drugs.  Do not share needles.  Ask your health  care provider for help if you need support or information about quitting drugs.  Tell your health care provider if you often feel depressed.  Tell your health care provider if you have ever been abused or do not feel safe at home. This information is not intended to replace advice given to you by your health care provider. Make sure you discuss any questions you have with your health care provider. Document Released: 10/31/2007 Document Revised: 01/01/2016 Document Reviewed: 02/05/2015 Elsevier Interactive Patient Education  2017 [employer]  

## 2016-08-11 ENCOUNTER — Encounter: Payer: Self-pay | Admitting: Family Medicine

## 2016-08-11 LAB — COMPREHENSIVE METABOLIC PANEL
A/G RATIO: 1.8 (ref 1.2–2.2)
ALT: 17 IU/L (ref 0–44)
AST: 20 IU/L (ref 0–40)
Albumin: 4.1 g/dL (ref 3.6–4.8)
Alkaline Phosphatase: 67 IU/L (ref 39–117)
BILIRUBIN TOTAL: 0.5 mg/dL (ref 0.0–1.2)
BUN/Creatinine Ratio: 17 (ref 10–24)
BUN: 18 mg/dL (ref 8–27)
CALCIUM: 9.5 mg/dL (ref 8.6–10.2)
CHLORIDE: 102 mmol/L (ref 96–106)
CO2: 26 mmol/L (ref 18–29)
Creatinine, Ser: 1.03 mg/dL (ref 0.76–1.27)
GFR calc Af Amer: 90 mL/min/{1.73_m2} (ref >59)
GFR calc non Af Amer: 77 mL/min/{1.73_m2} (ref >59)
GLUCOSE: 80 mg/dL (ref 65–99)
Globulin, Total: 2.3 g/dL (ref 1.5–4.5)
POTASSIUM: 4.6 mmol/L (ref 3.5–5.2)
Sodium: 141 mmol/L (ref 134–144)
TOTAL PROTEIN: 6.4 g/dL (ref 6.0–8.5)

## 2016-08-11 LAB — CBC WITH DIFFERENTIAL/PLATELET
BASOS ABS: 0 10*3/uL (ref 0.0–0.2)
BASOS: 1 %
EOS (ABSOLUTE): 0.4 10*3/uL (ref 0.0–0.4)
Eos: 7 %
Hematocrit: 36.8 % — ABNORMAL LOW (ref 37.5–51.0)
Hemoglobin: 13.1 g/dL (ref 13.0–17.7)
Immature Grans (Abs): 0 10*3/uL (ref 0.0–0.1)
Immature Granulocytes: 0 %
Lymphocytes Absolute: 1.3 10*3/uL (ref 0.7–3.1)
Lymphs: 23 %
MCH: 33.9 pg — ABNORMAL HIGH (ref 26.6–33.0)
MCHC: 35.6 g/dL (ref 31.5–35.7)
MCV: 95 fL (ref 79–97)
MONOS ABS: 0.4 10*3/uL (ref 0.1–0.9)
Monocytes: 7 %
Neutrophils Absolute: 3.5 10*3/uL (ref 1.4–7.0)
Neutrophils: 62 %
PLATELETS: 257 10*3/uL (ref 150–379)
RBC: 3.87 x10E6/uL — ABNORMAL LOW (ref 4.14–5.80)
RDW: 13.5 % (ref 12.3–15.4)
WBC: 5.6 10*3/uL (ref 3.4–10.8)

## 2016-08-11 LAB — PSA: Prostate Specific Ag, Serum: 2 ng/mL (ref 0.0–4.0)

## 2016-08-11 LAB — LIPID PANEL W/O CHOL/HDL RATIO
Cholesterol, Total: 226 mg/dL — ABNORMAL HIGH (ref 100–199)
HDL: 59 mg/dL (ref >39)
LDL Calculated: 140 mg/dL — ABNORMAL HIGH (ref 0–99)
TRIGLYCERIDES: 136 mg/dL (ref 0–149)
VLDL CHOLESTEROL CAL: 27 mg/dL (ref 5–40)

## 2016-08-11 LAB — TSH: TSH: 2.44 u[IU]/mL (ref 0.450–4.500)

## 2017-02-09 ENCOUNTER — Ambulatory Visit (INDEPENDENT_AMBULATORY_CARE_PROVIDER_SITE_OTHER): Payer: BC Managed Care – PPO | Admitting: Orthopaedic Surgery

## 2017-02-09 ENCOUNTER — Ambulatory Visit (INDEPENDENT_AMBULATORY_CARE_PROVIDER_SITE_OTHER): Payer: BC Managed Care – PPO

## 2017-02-09 DIAGNOSIS — M1611 Unilateral primary osteoarthritis, right hip: Secondary | ICD-10-CM

## 2017-02-09 DIAGNOSIS — M5442 Lumbago with sciatica, left side: Secondary | ICD-10-CM

## 2017-02-09 DIAGNOSIS — M25551 Pain in right hip: Secondary | ICD-10-CM | POA: Diagnosis not present

## 2017-02-09 DIAGNOSIS — G8929 Other chronic pain: Secondary | ICD-10-CM

## 2017-02-09 NOTE — Progress Notes (Signed)
Office Visit Note   Patient: James [name]           Date of Birth: [birth year]           MRN: [id number] Visit Date: 02/09/2017              Requested by: Valerie Roys, DO [place], [place] [address] PCP: Valerie Roys, DO   Assessment & Plan: Visit Diagnoses:  1. Pain in right hip   2. Chronic right-sided low back pain with left-sided sciatica   3. Unilateral primary osteoarthritis, right hip     Plan: Due to the severity of his arthritis now this is affected his activities daily living detrimentally as well as his quality of life and his mobility he does wish proceed with a total hip arthroplasty and I agree with this. This pain is been worsening for years now. We cannot even place an injection in the hip joint due to the fact that measures. He is already very thin individual is been working activity modification as well as using assistive device when he can and a home exercise program for strengthening. We had a long and thorough discussion about hip replacement surgery showing him his x-rays and a hip model expander in detail with the surgery involves including his intraoperative and postoperative course and the risk and benefits. He would like to have the surgery done sometime in November or December timeframe. All questions concerns were answered and addressed.  Follow-Up Instructions: Return for 2 weeks post-op.   Orders:  Orders Placed This Encounter  Procedures  . XR HIP UNILAT W OR W/O PELVIS 1V RIGHT  . XR Lumbar Spine 2-3 Views   No orders of the defined types were placed in this encounter.     Procedures: No procedures performed   Clinical Data: No additional findings.   Subjective: No chief complaint on file. The patient is very pleasant 61 year old woman comes in with right hip pain is been getting worse for over 5 years now. He denies any groin pain at first but when I told him he says noticed deathly significant groin pain. Radius all around the  hip down to his knee but does not go down to his foot. He also some low back pain in the right side is a pretty constant ache at this point wakes him up at night. He takes ibuprofen on occasion. His pain no can be 10 out of 10 at times and is detrimentally affecting his activities daily living as well as his job. His detrimentally affected his quality of life and his mobility. He walks with a significant limp as well.  HPI  Review of Systems He currently denies any chest pain, short of breath, fever, chills, nausea, vomiting.  Objective: Vital Signs: There were no vitals taken for this visit.  Physical Exam He is alert or 3 in no acute distress but he walks with a significant Trendelenburg gait. Ortho Exam On examination his right hip has essentially almost no motion with interaction rotation. He has severe pain in the groin with this is rating to the knee but his knee exam is normal right side. He has tight hamstrings and mentation of flexion extension of lumbar spine but does cause pain in his low back. His motor and sensory exam from his knee down to his foot and ankle are entirely normal. His left hip exam is normal. Specialty Comments:  No specialty comments available.  Imaging: Xr Hip Unilat W  Or W/o Pelvis 1v Right  Result Date: 02/09/2017 An AP pelvis and a lateral of his right hip shows profound end-stage arthritis. There is complete loss of the joint space. There severe and significant periarticular osteophytes. There is flattening of the femoral head. The femoral head is almost sublux. There is significant sclerotic changes on the femoral head and acetabulum as well as cystic changes.  Xr Lumbar Spine 2-3 Views  Result Date: 02/09/2017 2 views of the lumbar spine show no acute findings. There is degenerative disc disease at several levels between L3-L4, L4-L5, and L5-S1.    PMFS History: Patient Active Problem List   Diagnosis Date Noted  . Unilateral primary  osteoarthritis, right hip 02/09/2017  . Chronic right-sided low back pain with left-sided sciatica 02/09/2017  . Pain in right hip 02/09/2017   No past medical history on file.  No family history on file.  Past Surgical History:  Procedure Laterality Date  . collapsed lung  2012  . HERNIA REPAIR     X 2 with mesh  . INGUINAL HERNIA REPAIR  1990  . URETHRAL STRICTURE DILATATION     Social History   Occupational History  . Not on file.   Social History Main Topics  . Smoking status: Former Smoker    Quit date: 03/18/2012  . Smokeless tobacco: Never Used  . Alcohol use Yes     Comment: Socially  . Drug use: Yes    Types: Marijuana  . Sexual activity: Not Currently

## 2017-03-22 ENCOUNTER — Other Ambulatory Visit (INDEPENDENT_AMBULATORY_CARE_PROVIDER_SITE_OTHER): Payer: Self-pay

## 2017-03-29 NOTE — Progress Notes (Signed)
Please place orders in Epic as patient is being scheduled for a pre-op appointment! Thank you! 

## 2017-03-30 ENCOUNTER — Other Ambulatory Visit (INDEPENDENT_AMBULATORY_CARE_PROVIDER_SITE_OTHER): Payer: Self-pay | Admitting: Physician Assistant

## 2017-04-05 ENCOUNTER — Other Ambulatory Visit (HOSPITAL_COMMUNITY): Payer: Self-pay | Admitting: Emergency Medicine

## 2017-04-05 NOTE — Patient Instructions (Signed)
James [name]  04/05/2017   Your procedure is scheduled on: 04-09-17  Report to [facility] Main  Entrance     Report to admitting at 5:15AM   Call this number if you have problems the morning of surgery  [phone]   Remember: ONLY 1 PERSON MAY GO WITH YOU TO SHORT STAY TO GET  READY MORNING OF YOUR SURGERY.  Do not eat food or drink liquids :After Midnight.     Take these medicines the morning of surgery with A SIP OF WATER: NONE                                You may not have any metal on your body including hair pins and              piercings  Do not wear jewelry, make-up, lotions, powders or perfumes, deodorant                  Men may shave face and neck.   Do not bring valuables to the hospital. [place].  Contacts, dentures or bridgework may not be worn into surgery.  Leave suitcase in the car. After surgery it may be brought to your room.                  Please read over the following fact sheets you were given: _____________________________________________________________________             [facility] - Preparing for Surgery Before surgery, you can play an important role.  Because skin is not sterile, your skin needs to be as free of germs as possible.  You can reduce the number of germs on your skin by washing with CHG (chlorahexidine gluconate) soap before surgery.  CHG is an antiseptic cleaner which kills germs and bonds with the skin to continue killing germs even after washing. Please DO NOT use if you have an allergy to CHG or antibacterial soaps.  If your skin becomes reddened/irritated stop using the CHG and inform your nurse when you arrive at Short Stay. Do not shave (including legs and underarms) for at least 48 hours prior to the first CHG shower.  You may shave your face/neck. Please follow these instructions carefully:  1.  Shower with CHG Soap the night before surgery and  the  morning of Surgery.  2.  If you choose to wash your hair, wash your hair first as usual with your  normal  shampoo.  3.  After you shampoo, rinse your hair and body thoroughly to remove the  shampoo.                           4.  Use CHG as you would any other liquid soap.  You can apply chg directly  to the skin and wash                       Gently with a scrungie or clean washcloth.  5.  Apply the CHG Soap to your body ONLY FROM THE NECK DOWN.   Do not use on face/ open  Wound or open sores. Avoid contact with eyes, ears mouth and genitals (private parts).                       Wash face,  Genitals (private parts) with your normal soap.             6.  Wash thoroughly, paying special attention to the area where your surgery  will be performed.  7.  Thoroughly rinse your body with warm water from the neck down.  8.  DO NOT shower/wash with your normal soap after using and rinsing off  the CHG Soap.                9.  Pat yourself dry with a clean towel.            10.  Wear clean pajamas.            11.  Place clean sheets on your bed the night of your first shower and do not  sleep with pets. Day of Surgery : Do not apply any lotions/deodorants the morning of surgery.  Please wear clean clothes to the hospital/surgery center.  FAILURE TO FOLLOW THESE INSTRUCTIONS MAY RESULT IN THE CANCELLATION OF YOUR SURGERY PATIENT SIGNATURE_________________________________  NURSE SIGNATURE__________________________________  ________________________________________________________________________   James [name]  An incentive spirometer is a tool that can help keep your lungs clear and active. This tool measures how well you are filling your lungs with each breath. Taking long deep breaths may help reverse or decrease the chance of developing breathing (pulmonary) problems (especially infection) following:  A long period of time when you are unable to move or be  active. BEFORE THE PROCEDURE   If the spirometer includes an indicator to show your best effort, your nurse or respiratory therapist will set it to a desired goal.  If possible, sit up straight or lean slightly forward. Try not to slouch.  Hold the incentive spirometer in an upright position. INSTRUCTIONS FOR USE  1. Sit on the edge of your bed if possible, or sit up as far as you can in bed or on a chair. 2. Hold the incentive spirometer in an upright position. 3. Breathe out normally. 4. Place the mouthpiece in your mouth and seal your lips tightly around it. 5. Breathe in slowly and as deeply as possible, raising the piston or the ball toward the top of the column. 6. Hold your breath for 3-5 seconds or for as long as possible. Allow the piston or ball to fall to the bottom of the column. 7. Remove the mouthpiece from your mouth and breathe out normally. 8. Rest for a few seconds and repeat Steps 1 through 7 at least 10 times every 1-2 hours when you are awake. Take your time and take a few normal breaths between deep breaths. 9. The spirometer may include an indicator to show your best effort. Use the indicator as a goal to work toward during each repetition. 10. After each set of 10 deep breaths, practice coughing to be sure your lungs are clear. If you have an incision (the cut made at the time of surgery), support your incision when coughing by placing a pillow or rolled up towels firmly against it. Once you are able to get out of bed, walk around indoors and cough well. You may stop using the incentive spirometer when instructed by your caregiver.  RISKS AND COMPLICATIONS  Take your time so you do not get  dizzy or light-headed.  If you are in pain, you may need to take or ask for pain medication before doing incentive spirometry. It is harder to take a deep breath if you are having pain. AFTER USE  Rest and breathe slowly and easily.  It can be helpful to keep track of a log of  your progress. Your caregiver can provide you with a simple table to help with this. If you are using the spirometer at home, follow these instructions: [place] IF:   You are having difficultly using the spirometer.  You have trouble using the spirometer as often as instructed.  Your pain medication is not giving enough relief while using the spirometer.  You develop fever of 100.5 F (38.1 C) or higher. SEEK IMMEDIATE MEDICAL CARE IF:   You cough up bloody sputum that had not been present before.  You develop fever of 102 F (38.9 C) or greater.  You develop worsening pain at or near the incision site. MAKE SURE YOU:   Understand these instructions.  Will watch your condition.  Will get help right away if you are not doing well or get worse. Document Released: 09/14/2006 Document Revised: 07/27/2011 Document Reviewed: 11/15/2006 [facility] Patient Information 2014 [place], [place].   ________________________________________________________________________

## 2017-04-06 ENCOUNTER — Encounter (HOSPITAL_COMMUNITY): Payer: Self-pay

## 2017-04-06 ENCOUNTER — Other Ambulatory Visit: Payer: Self-pay

## 2017-04-06 ENCOUNTER — Encounter (HOSPITAL_COMMUNITY)
Admission: RE | Admit: 2017-04-06 | Discharge: 2017-04-06 | Disposition: A | Discharge status: Home / Self Care | Payer: BC Managed Care – PPO | Source: Ambulatory Visit | Attending: Orthopaedic Surgery | Admitting: Orthopaedic Surgery

## 2017-04-06 LAB — BASIC METABOLIC PANEL
ANION GAP: 7 (ref 5–15)
BUN: 15 mg/dL (ref 6–20)
CALCIUM: 9.4 mg/dL (ref 8.9–10.3)
CHLORIDE: 106 mmol/L (ref 101–111)
CO2: 28 mmol/L (ref 22–32)
Creatinine, Ser: 0.92 mg/dL (ref 0.61–1.24)
GFR calc non Af Amer: >60 mL/min (ref >60)
Glucose, Bld: 85 mg/dL (ref 65–99)
POTASSIUM: 4.1 mmol/L (ref 3.5–5.1)
Sodium: 141 mmol/L (ref 135–145)

## 2017-04-06 LAB — CBC
HEMATOCRIT: 35.8 % — AB (ref 39.0–52.0)
HEMOGLOBIN: 12.2 g/dL — AB (ref 13.0–17.0)
MCH: 33.2 pg (ref 26.0–34.0)
MCHC: 34.1 g/dL (ref 30.0–36.0)
MCV: 97.3 fL (ref 78.0–100.0)
Platelets: 232 10*3/uL (ref 150–400)
RBC: 3.68 MIL/uL — AB (ref 4.22–5.81)
RDW: 12.8 % (ref 11.5–15.5)
WBC: 7.5 10*3/uL (ref 4.0–10.5)

## 2017-04-06 LAB — SURGICAL PCR SCREEN
MRSA, PCR: NEGATIVE
Staphylococcus aureus: POSITIVE — AB

## 2017-04-06 LAB — ABO/RH: ABO/RH(D): O NEG

## 2017-04-07 NOTE — Anesthesia Preprocedure Evaluation (Addendum)
Anesthesia Evaluation  Patient identified by MRN, date of birth, ID band Patient awake    Reviewed: Allergy & Precautions, NPO status , Patient's Chart, lab work & pertinent test results  History of Anesthesia Complications Negative for: history of anesthetic complications  Airway Mallampati: II  TM Distance: >3 FB Neck ROM: Full    Dental no notable dental hx. (+) Dental Advisory Given   Pulmonary neg pulmonary ROS, former smoker,    Pulmonary exam normal        Cardiovascular negative cardio ROS Normal cardiovascular exam     Neuro/Psych negative neurological ROS     GI/Hepatic negative GI ROS, Neg liver ROS,   Endo/Other  negative endocrine ROS  Renal/GU negative Renal ROS     Musculoskeletal negative musculoskeletal ROS (+)   Abdominal   Peds  Hematology negative hematology ROS (+)   Anesthesia Other Findings Day of surgery medications reviewed with the patient.  Reproductive/Obstetrics                           Anesthesia Physical Anesthesia Plan  ASA: I  Anesthesia Plan: MAC and Spinal   Post-op Pain Management:    Induction:   PONV Risk Score and Plan: 2 and Ondansetron and Propofol infusion  Airway Management Planned: Natural Airway  Additional Equipment:   Intra-op Plan:   Post-operative Plan:   Informed Consent: I have reviewed the patients History and Physical, chart, labs and discussed the procedure including the risks, benefits and alternatives for the proposed anesthesia with the patient or authorized representative who has indicated his/her understanding and acceptance.   Dental advisory given  Plan Discussed with: CRNA, Anesthesiologist and Surgeon  Anesthesia Plan Comments:        Anesthesia Quick Evaluation

## 2017-04-09 ENCOUNTER — Encounter (HOSPITAL_COMMUNITY): Payer: Self-pay

## 2017-04-09 ENCOUNTER — Inpatient Hospital Stay (HOSPITAL_COMMUNITY): Payer: BC Managed Care – PPO

## 2017-04-09 ENCOUNTER — Inpatient Hospital Stay (HOSPITAL_COMMUNITY): Payer: BC Managed Care – PPO | Admitting: Anesthesiology

## 2017-04-09 ENCOUNTER — Encounter (HOSPITAL_COMMUNITY)
Admission: RE | Disposition: A | Discharge status: Home / Self Care | Payer: Self-pay | Source: Ambulatory Visit | Attending: Orthopaedic Surgery

## 2017-04-09 ENCOUNTER — Other Ambulatory Visit: Payer: Self-pay

## 2017-04-09 ENCOUNTER — Inpatient Hospital Stay (HOSPITAL_COMMUNITY)
Admission: RE | Admit: 2017-04-09 | Discharge: 2017-04-11 | DRG: 470 | Disposition: A | Discharge status: Home / Self Care | Payer: BC Managed Care – PPO | Source: Ambulatory Visit | Attending: Orthopaedic Surgery | Admitting: Orthopaedic Surgery

## 2017-04-09 DIAGNOSIS — M5442 Lumbago with sciatica, left side: Secondary | ICD-10-CM | POA: Diagnosis present

## 2017-04-09 DIAGNOSIS — Z87891 Personal history of nicotine dependence: Secondary | ICD-10-CM | POA: Diagnosis not present

## 2017-04-09 DIAGNOSIS — Z419 Encounter for procedure for purposes other than remedying health state, unspecified: Secondary | ICD-10-CM

## 2017-04-09 DIAGNOSIS — M1611 Unilateral primary osteoarthritis, right hip: Secondary | ICD-10-CM | POA: Diagnosis present

## 2017-04-09 DIAGNOSIS — Z79899 Other long term (current) drug therapy: Secondary | ICD-10-CM | POA: Diagnosis not present

## 2017-04-09 DIAGNOSIS — Z96641 Presence of right artificial hip joint: Secondary | ICD-10-CM

## 2017-04-09 HISTORY — PX: TOTAL HIP ARTHROPLASTY: SHX124

## 2017-04-09 LAB — TYPE AND SCREEN
ABO/RH(D): O NEG
Antibody Screen: NEGATIVE

## 2017-04-09 SURGERY — ARTHROPLASTY, HIP, TOTAL, ANTERIOR APPROACH
Anesthesia: Monitor Anesthesia Care | Site: Hip | Laterality: Right

## 2017-04-09 MED ORDER — SODIUM CHLORIDE 0.9 % IV SOLN
INTRAVENOUS | Status: DC
  Administered 2017-04-09 – 2017-04-10 (×2): via INTRAVENOUS

## 2017-04-09 MED ORDER — SODIUM CHLORIDE 0.9 % IR SOLN
Status: DC | PRN
  Administered 2017-04-09: 1000 mL

## 2017-04-09 MED ORDER — PROPOFOL 10 MG/ML IV BOLUS
INTRAVENOUS | Status: AC
  Filled 2017-04-09: qty 60

## 2017-04-09 MED ORDER — ONDANSETRON HCL 4 MG PO TABS: 4.0000 mg | ORAL_TABLET | Freq: Four times a day (QID) | ORAL | Status: DC | PRN

## 2017-04-09 MED ORDER — FENTANYL CITRATE (PF) 100 MCG/2ML IJ SOLN
INTRAMUSCULAR | Status: AC
  Filled 2017-04-09: qty 2

## 2017-04-09 MED ORDER — ADULT MULTIVITAMIN W/MINERALS CH
1.0000 | ORAL_TABLET | Freq: Every day | ORAL | Status: DC
  Administered 2017-04-10 – 2017-04-11 (×2): 1 via ORAL
  Filled 2017-04-09 (×3): qty 1

## 2017-04-09 MED ORDER — PROPOFOL 500 MG/50ML IV EMUL
INTRAVENOUS | Status: DC | PRN
  Administered 2017-04-09: 100 ug/kg/min via INTRAVENOUS

## 2017-04-09 MED ORDER — ONDANSETRON HCL 4 MG/2ML IJ SOLN
INTRAMUSCULAR | Status: DC | PRN
  Administered 2017-04-09: 4 mg via INTRAVENOUS

## 2017-04-09 MED ORDER — CEFAZOLIN SODIUM-DEXTROSE 2-4 GM/100ML-% IV SOLN
2.0000 g | INTRAVENOUS | Status: AC
  Administered 2017-04-09: 2 g via INTRAVENOUS

## 2017-04-09 MED ORDER — POVIDONE-IODINE 10 % EX SOLN
CUTANEOUS | Status: DC | PRN
Start: 2017-04-09 — End: 2017-04-09
  Administered 2017-04-09: 2 via TOPICAL
  Filled 2017-04-09: qty 118

## 2017-04-09 MED ORDER — STERILE WATER FOR IRRIGATION IR SOLN
Status: DC | PRN
  Administered 2017-04-09: 2000 mL

## 2017-04-09 MED ORDER — MIDAZOLAM HCL 5 MG/5ML IJ SOLN
INTRAMUSCULAR | Status: DC | PRN
  Administered 2017-04-09: 2 mg via INTRAVENOUS

## 2017-04-09 MED ORDER — OXYCODONE HCL 5 MG PO TABS
10.0000 mg | ORAL_TABLET | ORAL | Status: DC | PRN
  Administered 2017-04-09 – 2017-04-11 (×6): 10 mg via ORAL
  Filled 2017-04-09 (×6): qty 2

## 2017-04-09 MED ORDER — BUPIVACAINE HCL (PF) 0.75 % IJ SOLN
INTRAMUSCULAR | Status: DC | PRN
  Administered 2017-04-09: 2 mL via INTRATHECAL

## 2017-04-09 MED ORDER — ALUM & MAG HYDROXIDE-SIMETH 200-200-20 MG/5ML PO SUSP: 30.0000 mL | ORAL | Status: DC | PRN

## 2017-04-09 MED ORDER — LACTATED RINGERS IV SOLN
INTRAVENOUS | Status: DC
  Administered 2017-04-09 (×2): via INTRAVENOUS
  Administered 2017-04-09: 1000 mL via INTRAVENOUS

## 2017-04-09 MED ORDER — HYDROMORPHONE HCL 1 MG/ML IJ SOLN
1.0000 mg | INTRAMUSCULAR | Status: DC | PRN
  Administered 2017-04-09 – 2017-04-10 (×5): 1 mg via INTRAVENOUS
  Filled 2017-04-09 (×5): qty 1

## 2017-04-09 MED ORDER — TRANEXAMIC ACID 1000 MG/10ML IV SOLN
1000.0000 mg | INTRAVENOUS | Status: AC
  Administered 2017-04-09: 1000 mg via INTRAVENOUS
  Filled 2017-04-09: qty 1100

## 2017-04-09 MED ORDER — METOCLOPRAMIDE HCL 5 MG PO TABS: 5.0000 mg | ORAL_TABLET | Freq: Three times a day (TID) | ORAL | Status: DC | PRN

## 2017-04-09 MED ORDER — EPHEDRINE SULFATE-NACL 50-0.9 MG/10ML-% IV SOSY
PREFILLED_SYRINGE | INTRAVENOUS | Status: DC | PRN
  Administered 2017-04-09: 80 mg via INTRAVENOUS

## 2017-04-09 MED ORDER — PROMETHAZINE HCL 25 MG/ML IJ SOLN: 6.2500 mg | INTRAMUSCULAR | Status: DC | PRN

## 2017-04-09 MED ORDER — METOCLOPRAMIDE HCL 5 MG/ML IJ SOLN
5.0000 mg | Freq: Three times a day (TID) | INTRAMUSCULAR | Status: DC | PRN
  Administered 2017-04-09: 10 mg via INTRAVENOUS
  Filled 2017-04-09: qty 2

## 2017-04-09 MED ORDER — ACETAMINOPHEN 325 MG PO TABS
650.0000 mg | ORAL_TABLET | ORAL | Status: DC | PRN
  Administered 2017-04-10 – 2017-04-11 (×2): 650 mg via ORAL
  Filled 2017-04-09 (×2): qty 2

## 2017-04-09 MED ORDER — PHENYLEPHRINE 40 MCG/ML (10ML) SYRINGE FOR IV PUSH (FOR BLOOD PRESSURE SUPPORT)
PREFILLED_SYRINGE | INTRAVENOUS | Status: DC | PRN
  Administered 2017-04-09 (×3): 80 ug via INTRAVENOUS

## 2017-04-09 MED ORDER — ZOLPIDEM TARTRATE 5 MG PO TABS: 5.0000 mg | ORAL_TABLET | Freq: Every evening | ORAL | Status: DC | PRN

## 2017-04-09 MED ORDER — DOCUSATE SODIUM 100 MG PO CAPS
100.0000 mg | ORAL_CAPSULE | Freq: Two times a day (BID) | ORAL | Status: DC
  Administered 2017-04-09 – 2017-04-11 (×4): 100 mg via ORAL
  Filled 2017-04-09 (×4): qty 1

## 2017-04-09 MED ORDER — DIPHENHYDRAMINE HCL 12.5 MG/5ML PO ELIX: 12.5000 mg | ORAL_SOLUTION | ORAL | Status: DC | PRN

## 2017-04-09 MED ORDER — PHENOL 1.4 % MT LIQD: 1.0000 | OROMUCOSAL | Status: DC | PRN

## 2017-04-09 MED ORDER — ONDANSETRON HCL 4 MG/2ML IJ SOLN
INTRAMUSCULAR | Status: AC
  Filled 2017-04-09: qty 2

## 2017-04-09 MED ORDER — CEFAZOLIN SODIUM-DEXTROSE 1-4 GM/50ML-% IV SOLN
1.0000 g | Freq: Four times a day (QID) | INTRAVENOUS | Status: AC
  Administered 2017-04-09 (×2): 1 g via INTRAVENOUS
  Filled 2017-04-09 (×2): qty 50

## 2017-04-09 MED ORDER — MIDAZOLAM HCL 2 MG/2ML IJ SOLN
INTRAMUSCULAR | Status: AC
  Filled 2017-04-09: qty 2

## 2017-04-09 MED ORDER — METHOCARBAMOL 1000 MG/10ML IJ SOLN
500.0000 mg | Freq: Four times a day (QID) | INTRAVENOUS | Status: DC | PRN
  Administered 2017-04-09: 500 mg via INTRAVENOUS
  Filled 2017-04-09: qty 550

## 2017-04-09 MED ORDER — ACETAMINOPHEN 650 MG RE SUPP: 650.0000 mg | RECTAL | Status: DC | PRN

## 2017-04-09 MED ORDER — FENTANYL CITRATE (PF) 100 MCG/2ML IJ SOLN: 25.0000 ug | INTRAMUSCULAR | Status: DC | PRN

## 2017-04-09 MED ORDER — PHENYLEPHRINE 40 MCG/ML (10ML) SYRINGE FOR IV PUSH (FOR BLOOD PRESSURE SUPPORT)
PREFILLED_SYRINGE | INTRAVENOUS | Status: AC
  Filled 2017-04-09: qty 10

## 2017-04-09 MED ORDER — HYDROCODONE-ACETAMINOPHEN 5-325 MG PO TABS
1.0000 | ORAL_TABLET | ORAL | Status: DC | PRN
Start: 2017-04-09 — End: 2017-04-11
  Administered 2017-04-09 – 2017-04-10 (×4): 2 via ORAL
  Filled 2017-04-09 (×5): qty 2

## 2017-04-09 MED ORDER — PROMETHAZINE HCL 25 MG/ML IJ SOLN
12.5000 mg | Freq: Four times a day (QID) | INTRAMUSCULAR | Status: DC | PRN
  Administered 2017-04-09: 25 mg via INTRAVENOUS
  Filled 2017-04-09: qty 1

## 2017-04-09 MED ORDER — CHLORHEXIDINE GLUCONATE 4 % EX LIQD: 60.0000 mL | Freq: Once | CUTANEOUS | Status: DC

## 2017-04-09 MED ORDER — ONDANSETRON HCL 4 MG/2ML IJ SOLN
4.0000 mg | Freq: Four times a day (QID) | INTRAMUSCULAR | Status: DC | PRN
  Administered 2017-04-09: 12:00:00 4 mg via INTRAVENOUS
  Filled 2017-04-09: qty 2

## 2017-04-09 MED ORDER — METHOCARBAMOL 500 MG PO TABS
500.0000 mg | ORAL_TABLET | Freq: Four times a day (QID) | ORAL | Status: DC | PRN
  Administered 2017-04-09 – 2017-04-10 (×3): 500 mg via ORAL
  Filled 2017-04-09 (×3): qty 1

## 2017-04-09 MED ORDER — MENTHOL 3 MG MT LOZG: 1.0000 | LOZENGE | OROMUCOSAL | Status: DC | PRN

## 2017-04-09 MED ORDER — 0.9 % SODIUM CHLORIDE (POUR BTL) OPTIME
TOPICAL | Status: DC | PRN
Start: 2017-04-09 — End: 2017-04-09
  Administered 2017-04-09: 1000 mL

## 2017-04-09 MED ORDER — PROPOFOL 10 MG/ML IV BOLUS
INTRAVENOUS | Status: DC | PRN
  Administered 2017-04-09: 40 mg via INTRAVENOUS
  Administered 2017-04-09: 30 mg via INTRAVENOUS

## 2017-04-09 MED ORDER — ASPIRIN EC 325 MG PO TBEC
325.0000 mg | DELAYED_RELEASE_TABLET | Freq: Every day | ORAL | Status: DC
  Administered 2017-04-10 – 2017-04-11 (×2): 325 mg via ORAL
  Filled 2017-04-09 (×2): qty 1

## 2017-04-09 MED ORDER — FENTANYL CITRATE (PF) 100 MCG/2ML IJ SOLN
INTRAMUSCULAR | Status: DC | PRN
  Administered 2017-04-09 (×2): 50 ug via INTRAVENOUS

## 2017-04-09 MED ORDER — CEFAZOLIN SODIUM-DEXTROSE 2-4 GM/100ML-% IV SOLN
INTRAVENOUS | Status: AC
  Filled 2017-04-09: qty 100

## 2017-04-09 MED ORDER — TAMSULOSIN HCL 0.4 MG PO CAPS
0.4000 mg | ORAL_CAPSULE | Freq: Every day | ORAL | Status: DC
  Administered 2017-04-09 – 2017-04-11 (×3): 0.4 mg via ORAL
  Filled 2017-04-09 (×3): qty 1

## 2017-04-09 SURGICAL SUPPLY — 43 items
BAG ZIPLOCK 12X15 (MISCELLANEOUS) ×2 IMPLANT
BENZOIN TINCTURE PRP APPL 2/3 (GAUZE/BANDAGES/DRESSINGS) ×2 IMPLANT
BLADE SAW SGTL 18X1.27X75 (BLADE) ×2 IMPLANT
CAPT HIP TOTAL 2 ×2 IMPLANT
CATH COUDE 5CC RIBBED (CATHETERS) IMPLANT
CATH RIBBED COUDE 5CC (CATHETERS)
CELLS DAT CNTRL 66122 CELL SVR (MISCELLANEOUS) ×1 IMPLANT
COVER PERINEAL POST (MISCELLANEOUS) ×2 IMPLANT
COVER SURGICAL LIGHT HANDLE (MISCELLANEOUS) ×2 IMPLANT
DRAPE STERI IOBAN 125X83 (DRAPES) ×2 IMPLANT
DRAPE U-SHAPE 47X51 STRL (DRAPES) ×4 IMPLANT
DRESSING AQUACEL AG SP 3.5X10 (GAUZE/BANDAGES/DRESSINGS) ×1 IMPLANT
DRSG AQUACEL AG ADV 3.5X10 (GAUZE/BANDAGES/DRESSINGS) ×2 IMPLANT
DRSG AQUACEL AG SP 3.5X10 (GAUZE/BANDAGES/DRESSINGS) ×2
DURAPREP 26ML APPLICATOR (WOUND CARE) ×2 IMPLANT
ELECT REM PT RETURN 15FT ADLT (MISCELLANEOUS) ×2 IMPLANT
GAUZE XEROFORM 1X8 LF (GAUZE/BANDAGES/DRESSINGS) IMPLANT
GLOVE BIO SURGEON STRL SZ7.5 (GLOVE) ×2 IMPLANT
GLOVE BIOGEL PI IND STRL 7.0 (GLOVE) ×2 IMPLANT
GLOVE BIOGEL PI IND STRL 7.5 (GLOVE) ×3 IMPLANT
GLOVE BIOGEL PI IND STRL 8 (GLOVE) ×3 IMPLANT
GLOVE BIOGEL PI INDICATOR 7.0 (GLOVE) ×2
GLOVE BIOGEL PI INDICATOR 7.5 (GLOVE) ×3
GLOVE BIOGEL PI INDICATOR 8 (GLOVE) ×3
GLOVE ECLIPSE 8.0 STRL XLNG CF (GLOVE) ×2 IMPLANT
GLOVE SURG SS PI 7.5 STRL IVOR (GLOVE) ×2 IMPLANT
GOWN SPEC L3 XXLG W/TWL (GOWN DISPOSABLE) ×2 IMPLANT
GOWN STRL REUS W/TWL XL LVL3 (GOWN DISPOSABLE) ×8 IMPLANT
HANDPIECE INTERPULSE COAX TIP (DISPOSABLE) ×1
HOLDER FOLEY CATH W/STRAP (MISCELLANEOUS) ×2 IMPLANT
PACK ANTERIOR HIP CUSTOM (KITS) ×2 IMPLANT
RTRCTR WOUND ALEXIS 18CM MED (MISCELLANEOUS) ×2
SET HNDPC FAN SPRY TIP SCT (DISPOSABLE) ×1 IMPLANT
STAPLER VISISTAT 35W (STAPLE) IMPLANT
STRIP CLOSURE SKIN 1/2X4 (GAUZE/BANDAGES/DRESSINGS) ×2 IMPLANT
SUT ETHIBOND NAB CT1 #1 30IN (SUTURE) ×2 IMPLANT
SUT MNCRL AB 4-0 PS2 18 (SUTURE) IMPLANT
SUT VIC AB 0 CT1 36 (SUTURE) ×2 IMPLANT
SUT VIC AB 1 CT1 36 (SUTURE) ×2 IMPLANT
SUT VIC AB 2-0 CT1 27 (SUTURE) ×2
SUT VIC AB 2-0 CT1 TAPERPNT 27 (SUTURE) ×2 IMPLANT
TRAY FOLEY W/METER SILVER 16FR (SET/KITS/TRAYS/PACK) IMPLANT
YANKAUER SUCT BULB TIP 10FT TU (MISCELLANEOUS) ×2 IMPLANT

## 2017-04-09 NOTE — Brief Op Note (Signed)
04/09/2017  8:36 AM  PATIENT:  James [name]  61 y.o. male  PRE-OPERATIVE DIAGNOSIS:  severe osteoarthritis right hip  POST-OPERATIVE DIAGNOSIS:  severe osteoarthritis right hip  PROCEDURE:  Procedure(s): RIGHT TOTAL HIP ARTHROPLASTY ANTERIOR APPROACH (Right)  SURGEON:  Surgeon(s) and Role:    Mcarthur Rossetti, MD - Primary  PHYSICIAN ASSISTANT: Benita Stabile, PA-C  ANESTHESIA:   spinal  EBL:  500 mL   COUNTS:  YES  DICTATION: .Other Dictation: Dictation Number [id number]  PLAN OF CARE: Admit to inpatient   PATIENT DISPOSITION:  PACU - hemodynamically stable.   Delay start of Pharmacological VTE agent (>24hrs) due to surgical blood loss or risk of bleeding: no

## 2017-04-09 NOTE — Transfer of Care (Signed)
Immediate Anesthesia Transfer of Care Note  Patient: James [name]  Procedure(s) Performed: RIGHT TOTAL HIP ARTHROPLASTY ANTERIOR APPROACH (Right Hip)  Patient Location: PACU  Anesthesia Type:Spinal  Level of Consciousness: awake, alert , oriented and patient cooperative  Airway & Oxygen Therapy: Patient Spontanous Breathing and Patient connected to face mask  Post-op Assessment: Report given to RN and Post -op Vital signs reviewed and stable  Post vital signs: Reviewed and stable  Last Vitals: There were no vitals filed for this visit.  Last Pain:  Vitals:   04/09/17 0551  PainSc: 3       Patients Stated Pain Goal: 3 (35/67/01 4103)  Complications: No apparent anesthesia complications

## 2017-04-09 NOTE — H&P (Signed)
TOTAL HIP ADMISSION H&P  Patient is admitted for right total hip arthroplasty.  Subjective:  Chief Complaint: right hip pain  HPI: James [name], 61 y.o. male, has a history of pain and functional disability in the right hip(s) due to arthritis and patient has failed non-surgical conservative treatments for greater than 12 weeks to include NSAID's and/or analgesics, corticosteriod injections, flexibility and strengthening excercises, use of assistive devices and activity modification.  Onset of symptoms was gradual starting 5 years ago with gradually worsening course since that time.The patient noted no past surgery on the right hip(s).  Patient currently rates pain in the right hip at 10 out of 10 with activity. Patient has night pain, worsening of pain with activity and weight bearing, trendelenberg gait, pain that interfers with activities of daily living and pain with passive range of motion. Patient has evidence of subchondral cysts, subchondral sclerosis, periarticular osteophytes and joint space narrowing by imaging studies. This condition presents safety issues increasing the risk of falls.  There is no current active infection.  Patient Active Problem List   Diagnosis Date Noted  . Unilateral primary osteoarthritis, right hip 02/09/2017  . Chronic right-sided low back pain with left-sided sciatica 02/09/2017  . Pain in right hip 02/09/2017   History reviewed. No pertinent past medical history.  Past Surgical History:  Procedure Laterality Date  . collapsed lung  2012   "had to reinflate" " i carried large piece of sheet rock up stairs by myself "   . [place]   x2 , second was with mesh   . URETHRAL STRICTURE DILATATION      Current Facility-Administered Medications  Medication Dose Route Frequency Provider Last Rate Last Dose  . ceFAZolin (ANCEF) 2-4 GM/100ML-% IVPB           . ceFAZolin (ANCEF) IVPB 2g/100 mL premix  2 g Intravenous On Call to OR Pete Pelt, PA-C      . chlorhexidine (HIBICLENS) 4 % liquid 4 application  60 mL Topical Once Pete Pelt, PA-C      . lactated ringers infusion   Intravenous Continuous Duane Boston, MD 100 mL/hr at 04/09/17 0610 1,000 mL at 04/09/17 0610  . povidone-iodine (BETADINE) 10 % external solution   Topical PRN Mcarthur Rossetti, MD   2 application at 60/63/01 [phone]  . tranexamic acid (CYKLOKAPRON) 1,000 mg in sodium chloride 0.9 % 100 mL IVPB  1,000 mg Intravenous To OR Pete Pelt, PA-C       No Known Allergies  Social History   Tobacco Use  . Smoking status: Former Smoker    Types: Cigarettes    Last attempt to quit: 03/18/2012    Years since quitting: 5.0  . Smokeless tobacco: Never Used  . Tobacco comment: 5 years   Substance Use Topics  . Alcohol use: Yes    Comment: Socially    History reviewed. No pertinent family history.   Review of Systems  Musculoskeletal: Positive for joint pain.  All other systems reviewed and are negative.   Objective:  Physical Exam  Constitutional: He is oriented to person, place, and time. He appears well-developed and well-nourished.  HENT:  Head: Normocephalic and atraumatic.  Eyes: EOM are normal. Pupils are equal, round, and reactive to light.  Neck: Normal range of motion. Neck supple.  Cardiovascular: Normal rate and regular rhythm.  Respiratory: Effort normal and breath sounds normal.  GI: Soft. Bowel sounds are normal.  Musculoskeletal:  Right hip: He exhibits decreased range of motion, decreased strength, tenderness and bony tenderness.  Neurological: He is alert and oriented to person, place, and time.  Skin: Skin is warm and dry.  Psychiatric: He has a normal mood and affect.    Vital signs in last 24 hours: Weight:  [146 lb (66.2 kg)] 146 lb (66.2 kg) (11/23 0551)  Labs:   Estimated body mass index is 22.2 kg/m as calculated from the following:   Height as of this encounter: 5\' 8"  (1.727 m).   Weight as  of this encounter: 146 lb (66.2 kg).   Imaging Review Plain radiographs demonstrate severe degenerative joint disease of the right hip(s). The bone quality appears to be good for age and reported activity level.  Assessment/Plan:  End stage arthritis, right hip(s)  The patient history, physical examination, clinical judgement of the provider and imaging studies are consistent with end stage degenerative joint disease of the right hip(s) and total hip arthroplasty is deemed medically necessary. The treatment options including medical management, injection therapy, arthroscopy and arthroplasty were discussed at length. The risks and benefits of total hip arthroplasty were presented and reviewed. The risks due to aseptic loosening, infection, stiffness, dislocation/subluxation,  thromboembolic complications and other imponderables were discussed.  The patient acknowledged the explanation, agreed to proceed with the plan and consent was signed. Patient is being admitted for inpatient treatment for surgery, pain control, PT, OT, prophylactic antibiotics, VTE prophylaxis, progressive ambulation and ADL's and discharge planning.The patient is planning to be discharged home with home health services

## 2017-04-09 NOTE — OR Nursing (Signed)
04/09/17, 07:25, foley catheter & coude insertion was unsuccessful on both attempts.  Marquette Old, RN

## 2017-04-09 NOTE — Anesthesia Procedure Notes (Signed)
Spinal  Start time: 04/09/2017 7:16 AM End time: 04/09/2017 7:21 AM Staffing Anesthesiologist: Duane Boston, MD Resident/CRNA: Claudia Desanctis, CRNA Performed: resident/CRNA  Preanesthetic Checklist Completed: patient identified, site marked, surgical consent, pre-op evaluation, timeout performed, IV checked, risks and benefits discussed and monitors and equipment checked Spinal Block Patient position: sitting Prep: DuraPrep Patient monitoring: continuous pulse ox, blood pressure and heart rate Approach: right paramedian Location: L3-4 Injection technique: single-shot Needle Needle type: Pencan  Needle gauge: 24 G Needle length: 9 cm Assessment Sensory level: T6 Additional Notes Zero heme, positive csf before and after injection

## 2017-04-09 NOTE — Progress Notes (Signed)
Called Dr Ninfa Linden concerning OR being unable to insert a foley catheter due to known urinary strictures.  Dr Ninfa Linden verbalized waiting for patient's spinal to resolve instead of urology consult.  Patient has slight movement of upper thighs in PACU.  Dr Ninfa Linden notified.  We will keep Dr Ninfa Linden aware of spinal resolution in order that patient would be able to pass his urine afterwards.  Dr Ninfa Linden agreed.

## 2017-04-09 NOTE — Anesthesia Postprocedure Evaluation (Signed)
Anesthesia Post Note  Patient: James [name]  Procedure(s) Performed: RIGHT TOTAL HIP ARTHROPLASTY ANTERIOR APPROACH (Right Hip)     Patient location during evaluation: PACU Anesthesia Type: MAC and Spinal Level of consciousness: awake and alert Pain management: pain level controlled Vital Signs Assessment: post-procedure vital signs reviewed and stable Respiratory status: spontaneous breathing and respiratory function stable Cardiovascular status: blood pressure returned to baseline and stable Postop Assessment: spinal receding Anesthetic complications: no    Last Vitals:  Vitals:   04/09/17 1000 04/09/17 1015  BP: 118/69 101/75  Pulse: (!) 55 (!) 58  Resp: 12 12  Temp:    SpO2: 100% 100%    Last Pain:  Vitals:   04/09/17 1015  PainSc: 0-No pain                 James [name],James [name]

## 2017-04-10 LAB — BASIC METABOLIC PANEL
Anion gap: 5 (ref 5–15)
BUN: 11 mg/dL (ref 6–20)
CHLORIDE: 102 mmol/L (ref 101–111)
CO2: 26 mmol/L (ref 22–32)
CREATININE: 0.92 mg/dL (ref 0.61–1.24)
Calcium: 8.4 mg/dL — ABNORMAL LOW (ref 8.9–10.3)
Glucose, Bld: 112 mg/dL — ABNORMAL HIGH (ref 65–99)
Potassium: 3.9 mmol/L (ref 3.5–5.1)
SODIUM: 133 mmol/L — AB (ref 135–145)

## 2017-04-10 LAB — CBC
HCT: 28.8 % — ABNORMAL LOW (ref 39.0–52.0)
HEMOGLOBIN: 9.8 g/dL — AB (ref 13.0–17.0)
MCH: 32.9 pg (ref 26.0–34.0)
MCHC: 34 g/dL (ref 30.0–36.0)
MCV: 96.6 fL (ref 78.0–100.0)
PLATELETS: 165 10*3/uL (ref 150–400)
RBC: 2.98 MIL/uL — AB (ref 4.22–5.81)
RDW: 12.7 % (ref 11.5–15.5)
WBC: 6 10*3/uL (ref 4.0–10.5)

## 2017-04-10 MED ORDER — METHOCARBAMOL 500 MG PO TABS: 500.0000 mg | ORAL_TABLET | Freq: Four times a day (QID) | ORAL | 0 refills | Status: DC | PRN

## 2017-04-10 MED ORDER — OXYCODONE-ACETAMINOPHEN 5-325 MG PO TABS: 1.0000 | ORAL_TABLET | ORAL | 0 refills | Status: DC | PRN

## 2017-04-10 MED ORDER — ASPIRIN 325 MG PO TBEC: 325.0000 mg | DELAYED_RELEASE_TABLET | Freq: Every day | ORAL | 0 refills | Status: DC

## 2017-04-10 NOTE — Evaluation (Signed)
Occupational Therapy Evaluation Patient Details Name: James [name] MRN: [id number] DOB: 07/25/1955 Today's Date: 04/10/2017    History of Present Illness Pt s/p R THR    Clinical Impression   This 61 year old man was admitted for the above sx.  Wife will assist with adls at home. She would like OT to return once more so that she can practice assisting pt with bathroom transfers    Follow Up Recommendations  Supervision/Assistance - 24 hour    Equipment Recommendations  3 in 1 bedside commode    Recommendations for Other Services       Precautions / Restrictions Precautions Precautions: Fall Restrictions Weight Bearing Restrictions: No Other Position/Activity Restrictions: WBAT      Mobility Bed Mobility Overal bed mobility: Needs Assistance Bed Mobility: Supine to Sit     Supine to sit: Min assist Sit to supine: Min assist   General bed mobility comments: asist for RLE  Transfers Overall transfer level: Needs assistance Equipment used: Rolling walker (2 wheeled) Transfers: Sit to/from Stand Sit to Stand: Min guard         General transfer comment: cues for UE/LE placement    Balance Overall balance assessment: No apparent balance deficits (not formally assessed)                                         ADL either performed or assessed with clinical judgement   ADL Overall ADL's : Needs assistance/impaired Eating/Feeding: Independent   Grooming: Supervision/safety;Standing   Upper Body Bathing: Set up;Sitting   Lower Body Bathing: Minimal assistance;Sit to/from stand   Upper Body Dressing : Set up;Sitting   Lower Body Dressing: Maximal assistance;Sit to/from stand   Toilet Transfer: Minimal assistance;Ambulation;BSC;RW   Toileting- Water quality scientist and Hygiene: Min guard;Sit to/from stand   Tub/ Shower Transfer: Walk-in shower;Min guard;3 in 1;Ambulation     General ADL Comments: ambulated to bathroom and practiced  toilet and shower transfers:  cues for safety with sidestepping through tight spaces and also cued for sequence with ambulating/shower transfer.  Wife will assist with adls     Vision         Perception     Praxis      Pertinent Vitals/Pain Pain Assessment: 0-10 Pain Score: 5  Pain Location: R hip Pain Descriptors / Indicators: Aching;Sore Pain Intervention(s): Limited activity within patient's tolerance;Monitored during session;Premedicated before session;Repositioned;Ice applied     Hand Dominance     Extremity/Trunk Assessment Upper Extremity Assessment Upper Extremity Assessment: Overall WFL for tasks assessed      Cervical / Trunk Assessment Cervical / Trunk Assessment: Normal   Communication Communication Communication: No difficulties   Cognition Arousal/Alertness: Awake/alert Behavior During Therapy: WFL for tasks assessed/performed Overall Cognitive Status: Within Functional Limits for tasks assessed                                     General Comments       Exercises    Shoulder Instructions      Home Living Family/patient expects to be discharged to:: Private residence Living Arrangements: Spouse/significant other Available Help at Discharge: Family Type of Home: House Home Access: Stairs to enter Technical brewer of Steps: 3 Entrance Stairs-Rails: None Home Layout: One level     Bathroom Shower/Tub: Occupational psychologist:  Standard                Prior Functioning/Environment Level of Independence: Independent        Comments: worked as Patent examiner Problem List: Decreased safety awareness;Pain      OT Treatment/Interventions: Self-care/ADL training;DME and/or AE instruction;Patient/family education    OT Goals(Current goals can be found in the care plan section) Acute Rehab OT Goals Patient Stated Goal: Regain IND 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 OT Goal Formulation: With  patient/family Time For Goal Achievement: 04/14/17 Potential to Achieve Goals: Good ADL Goals Pt Will Transfer to Toilet: with supervision;ambulating;bedside commode Pt Will Perform Tub/Shower Transfer: Shower transfer;with caregiver independent in assisting  OT Frequency: Min 2X/week   Barriers to D/C:            Co-evaluation              AM-PAC PT "6 Clicks" Daily Activity     Outcome Measure Help from another person eating meals?: None Help from another person taking care of personal grooming?: A Little Help from another person toileting, which includes using toliet, bedpan, or urinal?: A Little Help from another person bathing (including washing, rinsing, drying)?: A Little Help from another person to put on and taking off regular upper body clothing?: A Little Help from another person to put on and taking off regular lower body clothing?: A Lot 6 Click Score: 18   End of Session    Activity Tolerance: Patient tolerated treatment well Patient left: in bed;with call bell/phone within reach;with bed alarm set;with family/visitor present  OT Visit Diagnosis: Pain Pain - Right/Left: Right Pain - part of body: Hip                Time: 9407-6808 OT Time Calculation (min): 20 min Charges:  OT General Charges $OT Visit: 1 Visit OT Evaluation $OT Eval Low Complexity: 1 Low G-Codes:     Lesle Chris, OTR/L [phone] 04/10/2017  Rakeb Kibble 04/10/2017, 12:46 PM

## 2017-04-10 NOTE — Plan of Care (Signed)
Plan of care discussed with patient.  Questions answered.  Denies any further questions at this time.

## 2017-04-10 NOTE — Progress Notes (Signed)
Physical Therapy Treatment Patient Details Name: James [name] MRN: [id number] DOB: 02-02-1956 Today's Date: 04/10/2017    History of Present Illness Pt s/p R THR , anterior    PT Comments    The patient reports feeling chilled. Temp 101.5. RN notified. Continue PT.   Follow Up Recommendations  Home health PT     Equipment Recommendations  Rolling walker with 5" wheels    Recommendations for Other Services       Precautions / Restrictions Precautions Precautions: Fall Restrictions Other Position/Activity Restrictions: WBAT    Mobility  Bed Mobility Overal bed mobility: Needs Assistance Bed Mobility: Supine to Sit;Sit to Supine     Supine to sit: Min assist Sit to supine: Min assist   General bed mobility comments: assist for RLE  Transfers Overall transfer level: Needs assistance Equipment used: Rolling walker (2 wheeled) Transfers: Sit to/from Stand Sit to Stand: Min guard         General transfer comment: cues for UE/LE placement  Ambulation/Gait Ambulation/Gait assistance: Min assist       Gait velocity: decr   General Gait Details: cues for posture, position from RW and initial sequence, cues for safety, did not completely turn when approached bed and  faltered , steady assist  provided.   Stairs            Wheelchair Mobility    Modified Rankin (Stroke Patients Only)       Balance                                            Cognition Arousal/Alertness: Awake/alert Behavior During Therapy: WFL for tasks assessed/performed Overall Cognitive Status: Within Functional Limits for tasks assessed                                        Exercises      General Comments        Pertinent Vitals/Pain Pain Score: 4  Pain Location: R hip Pain Descriptors / Indicators: Aching;Sore Pain Intervention(s): Monitored during session;Premedicated before session;Repositioned;Ice applied    Home Living  Family/patient expects to be discharged to:: Private residence Living Arrangements: Spouse/significant other Available Help at Discharge: Family                Prior Function Level of Independence: Independent      Comments: worked as Futures trader (current goals can now be found in the care plan section) Acute Rehab PT Goals Patient Stated Goal: 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 : Unable Difficulty sitting down on and standing up from a chair with arms (e.g., wheelchair, bedside commode, etc,.)?: Unable Help needed moving to and from a bed to chair (including a wheelchair)?: A Little Help needed walking in hospital room?: A Little Help needed climbing 3-5 steps with  a railing? : A Little 6 Click Score: 12    End of Session   Activity Tolerance: Patient tolerated treatment well Patient left: with call bell/phone within reach;with family/visitor present;in bed Nurse Communication: Mobility status PT Visit Diagnosis: Difficulty in walking, not elsewhere classified (R26.2)     Time: 1435-1500 PT Time Calculation (min) (ACUTE ONLY): 25 min  Charges:  $Gait Training: 23-37 mins                    G Codes:          Claretha Cooper 04/10/2017, 3:49 PM

## 2017-04-10 NOTE — Care Management Note (Signed)
Case Management Note  Patient Details  Name: Wasil Wolke MRN: [id number] Date of Birth: 1956/02/06  Subjective/Objective:    Right THA                Action/Plan: Discharge Planning: NCM spoke to pt and wife at bedside. Offered choice for [facility]. Pt agreeable to [facility], (preoperatively arranged with [facility]. Contacted [facility] DME rep for RW and 3n1 bedside commode to be delivered to room prior to dc.   PCP Valerie Roys MD  Expected Discharge Date:                Expected Discharge Plan:  [place]  In-House Referral:  NA  Discharge planning Services  CM Consult  Post Acute Care Choice:  Home Health Choice offered to:  Patient, Spouse  DME Arranged:  3-N-1, Walker rolling DME Agency:  [place]:  PT [place]:  [facility])  Status of Service:  Completed, signed off  If discussed at [employer] of Stay Meetings, dates discussed:    Additional Comments:  Erenest Rasher, RN 04/10/2017, 12:33 PM

## 2017-04-10 NOTE — Progress Notes (Signed)
Patient ID: James [name], male   DOB: 12/08/1955, 61 y.o.   MRN: [id number] Patient is postoperative day 1 right total hip arthroplasty.  He has been up with therapy and has done well.  Anticipate discharge to home tomorrow Sunday.

## 2017-04-10 NOTE — Discharge Instructions (Signed)

## 2017-04-10 NOTE — Evaluation (Signed)
Physical Therapy Evaluation Patient Details Name: James [name] MRN: [id number] DOB: June 29, 1955 Today's Date: 04/10/2017   History of Present Illness  Pt s/p R THR   Clinical Impression  Pt s/p R THR and presents with decreased R LE strength/ROM and post op pain limiting functional mobility.  Pt should progress to dc home with family assist/    Follow Up Recommendations Home health PT    Equipment Recommendations  Rolling walker with 5" wheels    Recommendations for Other Services OT consult     Precautions / Restrictions Precautions Precautions: Fall Restrictions Weight Bearing Restrictions: No Other Position/Activity Restrictions: WBAT      Mobility  Bed Mobility Overal bed mobility: Needs Assistance Bed Mobility: Supine to Sit     Supine to sit: Min assist     General bed mobility comments: cues for sequence and use of L LE to self assist  Transfers Overall transfer level: Needs assistance Equipment used: Rolling walker (2 wheeled) Transfers: Sit to/from Stand Sit to Stand: Min assist         General transfer comment: cues for LE management and use of UEs to self assist  Ambulation/Gait Ambulation/Gait assistance: Min assist Ambulation Distance (Feet): 111 Feet Assistive device: Rolling walker (2 wheeled) Gait Pattern/deviations: Step-to pattern;Step-through pattern;Decreased step length - right;Decreased step length - left;Shuffle;Trunk flexed Gait velocity: decr Gait velocity interpretation: Below normal speed for age/gender General Gait Details: cues for posture, position from RW and initial sequence  Stairs            Wheelchair Mobility    Modified Rankin (Stroke Patients Only)       Balance Overall balance assessment: No apparent balance deficits (not formally assessed)                                           Pertinent Vitals/Pain Pain Assessment: 0-10 Pain Score: 5  Pain Location: R hip Pain Descriptors /  Indicators: Aching;Sore Pain Intervention(s): Limited activity within patient's tolerance;Monitored during session;Premedicated before session;Ice applied    Home Living Family/patient expects to be discharged to:: Private residence Living Arrangements: Spouse/significant other Available Help at Discharge: Family Type of Home: House Home Access: Stairs to enter Entrance Stairs-Rails: None Technical brewer of Steps: 3 Home Layout: One level        Prior Function Level of Independence: Independent         Comments: worked as Immunologist        Extremity/Trunk Assessment   Upper Extremity Assessment Upper Extremity Assessment: Overall WFL for tasks assessed    Lower Extremity Assessment Lower Extremity Assessment: RLE deficits/detail RLE Deficits / Details: Strength at hip 2/3 with AAROM at hip to 80 flex and 20 abd    Cervical / Trunk Assessment Cervical / Trunk Assessment: Normal  Communication   Communication: No difficulties  Cognition Arousal/Alertness: Awake/alert Behavior During Therapy: WFL for tasks assessed/performed Overall Cognitive Status: Within Functional Limits for tasks assessed                                        General Comments      Exercises Total Joint Exercises Ankle Circles/Pumps: AROM;Both;15 reps;Supine Quad Sets: AROM;Both;10 reps;Supine Heel Slides: AAROM;Right;20 reps;Supine Hip ABduction/ADduction: AAROM;Right;15 reps;Supine   Assessment/Plan  PT Assessment Patient needs continued PT services  PT Problem List Decreased strength;Decreased range of motion;Decreased activity tolerance;Decreased mobility;Decreased knowledge of use of DME;Pain       PT Treatment Interventions DME instruction;Gait training;Stair training;Functional mobility training;Therapeutic activities;Therapeutic exercise;Patient/family education    PT Goals (Current goals can be found in the Care Plan section)   Acute Rehab PT Goals Patient Stated Goal: 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 : Unable Difficulty sitting down on and standing up from a chair with arms (e.g., wheelchair, bedside commode, etc,.)?: Unable Help needed moving to and from a bed to chair (including a wheelchair)?: A Little Help needed walking in hospital room?: A Little Help needed climbing 3-5 steps with a railing? : A Little 6 Click Score: 12    End of Session Equipment Utilized During Treatment: Gait belt Activity Tolerance: Patient tolerated treatment well Patient left: in chair;with call bell/phone within reach;with family/visitor present;with chair alarm set Nurse Communication: Mobility status PT Visit Diagnosis: Difficulty in walking, not elsewhere classified (R26.2)    Time: 4462-8638 PT Time Calculation (min) (ACUTE ONLY): 31 min   Charges:   PT Evaluation $PT Eval Low Complexity: 1 Low PT Treatments $Therapeutic Exercise: 8-22 mins   PT G Codes:        Pg [phone]   James [name] 04/10/2017, 12:05 PM

## 2017-04-11 LAB — CBC
HCT: 28.6 % — ABNORMAL LOW (ref 39.0–52.0)
HEMOGLOBIN: 9.8 g/dL — AB (ref 13.0–17.0)
MCH: 33.1 pg (ref 26.0–34.0)
MCHC: 34.3 g/dL (ref 30.0–36.0)
MCV: 96.6 fL (ref 78.0–100.0)
Platelets: 157 10*3/uL (ref 150–400)
RBC: 2.96 MIL/uL — ABNORMAL LOW (ref 4.22–5.81)
RDW: 12.7 % (ref 11.5–15.5)
WBC: 7.2 10*3/uL (ref 4.0–10.5)

## 2017-04-11 NOTE — Progress Notes (Signed)
Occupational Therapy Treatment Patient Details Name: James [name] MRN: [id number] DOB: 10-08-1955 Today's Date: 04/11/2017    History of present illness Pt s/p R THR , direct anterior   OT comments  Wife will A as needed  Follow Up Recommendations  Supervision/Assistance - 24 hour    Equipment Recommendations  3 in 1 bedside commode    Recommendations for Other Services      Precautions / Restrictions Precautions Precautions: Fall Restrictions Weight Bearing Restrictions: No Other Position/Activity Restrictions: WBAT       Mobility Bed Mobility Overal bed mobility: Modified Independent Bed Mobility: Supine to Sit;Sit to Supine     Supine to sit: Modified independent (Device/Increase time)        Transfers Overall transfer level: Needs assistance Equipment used: Rolling walker (2 wheeled) Transfers: Sit to/from [facility] Sit to Stand: Supervision         General transfer comment: cues for UE/LE placement    Balance Overall balance assessment: Modified Independent                                         ADL either performed or assessed with clinical judgement   ADL Overall ADL's : Needs assistance/impaired Eating/Feeding: Independent   Grooming: Supervision/safety;Standing   Upper Body Bathing: Set up;Sitting   Lower Body Bathing: Minimal assistance;Sit to/from stand;With caregiver independent assisting   Upper Body Dressing : Set up;Sitting   Lower Body Dressing: Sit to/from stand;Minimal assistance;With caregiver independent assisting   Toilet Transfer: Ambulation;BSC;RW;Supervision/safety   Toileting- Clothing Manipulation and Hygiene: Sit to/from stand;Supervision/safety   Tub/ Shower Transfer: Walk-in shower;Min guard;3 in 1;Ambulation   Functional mobility during ADLs: Min guard General ADL Comments: wife will A as needed               Cognition Arousal/Alertness: Awake/alert Behavior During  Therapy: WFL for tasks assessed/performed Overall Cognitive Status: Within Functional Limits for tasks assessed                                                General Comments      Pertinent Vitals/ Pain       Pain Score: 3  Pain Location: R hip Pain Descriptors / Indicators: Aching;Sore Pain Intervention(s): Monitored during session     Prior Functioning/Environment              Frequency  Min 2X/week        Progress Toward Goals  OT Goals(current goals can now be found in the care plan section)  Progress towards OT goals: Progressing toward goals  Acute Rehab OT Goals Patient Stated Goal: Regain IND and back to work as Merchandiser, retail                 AM-PAC PT "6 Clicks" Daily Activity     Outcome Measure   Help from another person eating meals?: None Help from another person taking care of personal grooming?: None Help from another person toileting, which includes using toliet, bedpan, or urinal?: A Little Help from another person bathing (including washing, rinsing, drying)?: A Little Help from another person to put on and taking off regular upper body clothing?: None Help from another person to put on  and taking off regular lower body clothing?: A Little 6 Click Score: 21    End of Session    OT Visit Diagnosis: Pain;Unsteadiness on feet (R26.81) Pain - Right/Left: Right Pain - part of body: Hip   Activity Tolerance Patient tolerated treatment well   Patient Left in bed;with call bell/phone within reach;with bed alarm set;with family/visitor present   Nurse Communication          Time: 8250-0370 OT Time Calculation (min): 13 min  Charges: OT General Charges $OT Visit: 1 Visit OT Treatments $Self Care/Home Management : 8-22 mins  [place]   Payton Mccallum D 04/11/2017, 11:50 AM

## 2017-04-11 NOTE — Discharge Summary (Signed)
Discharge Diagnoses:  Principal Problem:   Unilateral primary osteoarthritis, right hip Active Problems:   Status post total replacement of right hip   Surgeries: Procedure(s): RIGHT TOTAL HIP ARTHROPLASTY ANTERIOR APPROACH on 04/09/2017    Consultants:   Discharged Condition: Improved  Hospital Course: James [name] is an 61 y.o. male who was admitted 04/09/2017 with a chief complaint of osteoarthritis right hip, with a final diagnosis of severe osteoarthritis right hip.  Patient was brought to the operating room on 04/09/2017 and underwent Procedure(s): RIGHT TOTAL HIP ARTHROPLASTY ANTERIOR APPROACH.    Patient was given perioperative antibiotics:  Anti-infectives (From admission, onward)   Start     Dose/Rate Route Frequency Ordered Stop   04/09/17 1400  ceFAZolin (ANCEF) IVPB 1 g/50 mL premix     1 g 100 mL/hr over 30 Minutes Intravenous Every 6 hours 04/09/17 1103 04/09/17 2128   04/09/17 0527  ceFAZolin (ANCEF) 2-4 GM/100ML-% IVPB    Comments:  [place] Session   : cabinet override      04/09/17 0527 04/09/17 0723   04/09/17 0523  ceFAZolin (ANCEF) IVPB 2g/100 mL premix     2 g 200 mL/hr over 30 Minutes Intravenous On call to O.R. 04/09/17 5809 04/09/17 0733    .  Patient was given sequential compression devices, early ambulation, and aspirin for DVT prophylaxis.  Recent vital signs:  Patient Vitals for the past 24 hrs:  BP Temp Temp src Pulse Resp SpO2  04/11/17 0521 112/72 98.6 F (37 C) Oral 100 17 98 %  04/10/17 2331 - 99.1 F (37.3 C) Oral - - -  04/10/17 2102 130/76 100.1 F (37.8 C) Oral (!) 109 18 94 %  04/10/17 1718 - 98.9 F (37.2 C) Oral - - -  04/10/17 1406 (!) 144/75 99.3 F (37.4 C) Oral 98 16 97 %  04/10/17 1100 130/67 99.2 F (37.3 C) Oral (!) 102 18 94 %  .  Recent laboratory studies: Dg Pelvis Portable  Result Date: 04/09/2017 CLINICAL DATA:  Right total hip arthroplasty. EXAM: PORTABLE PELVIS 1-2 VIEWS COMPARISON:  02/09/2017 radiographs  FINDINGS: Right total hip arthroplasty identified without complicating features. No fracture or dislocation noted. IMPRESSION: Right total hip arthroplasty without complicating features. Electronically Signed   By: Margarette Canada M.D.   On: 04/09/2017 09:34    Discharge Medications:   Allergies as of 04/11/2017   No Known Allergies     Medication List    STOP taking these medications   aspirin 81 MG tablet Replaced by:  aspirin 325 MG EC tablet   multivitamin tablet     TAKE these medications   aspirin 325 MG EC tablet Take 1 tablet (325 mg total) by mouth daily with breakfast. Replaces:  aspirin 81 MG tablet   methocarbamol 500 MG tablet Commonly known as:  ROBAXIN Take 1 tablet (500 mg total) by mouth every 6 (six) hours as needed for muscle spasms.   oxyCODONE-acetaminophen 5-325 MG tablet Commonly known as:  ROXICET Take 1-2 tablets by mouth every 4 (four) hours as needed.            Durable Medical Equipment  (From admission, onward)        Start     Ordered   04/09/17 1104  DME 3 n 1  Once     04/09/17 1103   04/09/17 1104  DME Walker rolling  Once    Question:  Patient needs a walker to treat with the following condition  Answer:  Status  post total replacement of right hip   04/09/17 1103      Diagnostic Studies: Dg Pelvis Portable  Result Date: 04/09/2017 CLINICAL DATA:  Right total hip arthroplasty. EXAM: PORTABLE PELVIS 1-2 VIEWS COMPARISON:  02/09/2017 radiographs FINDINGS: Right total hip arthroplasty identified without complicating features. No fracture or dislocation noted. IMPRESSION: Right total hip arthroplasty without complicating features. Electronically Signed   By: Margarette Canada M.D.   On: 04/09/2017 09:34   Dg C-arm 1-60 Min-no Report  Result Date: 04/09/2017 Fluoroscopy was utilized by the requesting physician.  No radiographic interpretation.   Dg Hip Operative Unilat W Or W/o Pelvis Right  Result Date: 04/09/2017 CLINICAL DATA:  Right  hip replacement EXAM: OPERATIVE RIGHT HIP WITH PELVIS COMPARISON:  None. FLUOROSCOPY TIME:  Radiation Exposure Index (as provided by the fluoroscopic device): Not available If the device does not provide the exposure index: Fluoroscopy Time:  54 seconds Number of Acquired Images:  4 FINDINGS: Initial images demonstrate severe degenerative changes of the right hip joint with remodeling of the acetabulum. Subsequent images demonstrate right hip replacement in satisfactory position. IMPRESSION: Right hip replacement Electronically Signed   By: Inez Catalina M.D.   On: 04/09/2017 08:50    Patient benefited maximally from their hospital stay and there were no complications.     Disposition: 01-Home or Self Care Discharge Instructions    Call MD / Call 911   Complete by:  As directed    If you experience chest pain or shortness of breath, CALL 911 and be transported to the hospital emergency room.  If you develope a fever above 101 F, pus (white drainage) or increased drainage or redness at the wound, or calf pain, call your surgeon's office.   Constipation Prevention   Complete by:  As directed    Drink plenty of fluids.  Prune juice may be helpful.  You may use a stool softener, such as Colace (over the counter) 100 mg twice a day.  Use MiraLax (over the counter) for constipation as needed.   Diet - low sodium heart healthy   Complete by:  As directed    Increase activity slowly as tolerated   Complete by:  As directed      Follow-up Information    [facility] up.   Specialty:  [facility] Why:  Home Health Physical Therapy- agency will call with initial appointment Contact information: [place] [address] [phone]        Mcarthur Rossetti, MD Follow up in 2 week(s).   Specialty:  Orthopedic Surgery Contact information: [place] [address] [phone]            Signed: Newt Minion 04/11/2017, 9:19 AM

## 2017-04-11 NOTE — Progress Notes (Signed)
Physical Therapy Treatment Patient Details Name: James [name] MRN: [id number] DOB: Nov 01, 1955 Today's Date: 04/11/2017    History of Present Illness Pt s/p R THR , direct anterior    PT Comments    Pt has met PT goals and is ready to DC home from PT standpoint. Stair training completed, reviewed HEP, pt ambulated 150' with RW without loss of balance. Wife present for stair training and review of HEP.    Follow Up Recommendations  Home health PT     Equipment Recommendations  Rolling walker with 5" wheels    Recommendations for Other Services       Precautions / Restrictions Precautions Precautions: Fall Restrictions Weight Bearing Restrictions: No Other Position/Activity Restrictions: WBAT    Mobility  Bed Mobility Overal bed mobility: Modified Independent Bed Mobility: Supine to Sit;Sit to Supine     Supine to sit: Modified independent (Device/Increase time)        Transfers Overall transfer level: Needs assistance Equipment used: Rolling walker (2 wheeled) Transfers: Sit to/from Stand Sit to Stand: Supervision         General transfer comment: cues for UE/LE placement  Ambulation/Gait Ambulation/Gait assistance: Supervision Ambulation Distance (Feet): 150 Feet Assistive device: Rolling walker (2 wheeled) Gait Pattern/deviations: Step-to pattern Gait velocity: decr   General Gait Details: steady with RW, good sequencing, no LOB   Stairs Stairs: Yes   Stair Management: No rails;Backwards;With walker Number of Stairs: 6(3 x 2 trials) General stair comments: wife present and assisted with management of RW, min A for RW, VCs sequencing  Wheelchair Mobility    Modified Rankin (Stroke Patients Only)       Balance Overall balance assessment: Modified Independent                                          Cognition Arousal/Alertness: Awake/alert Behavior During Therapy: WFL for tasks assessed/performed Overall Cognitive  Status: Within Functional Limits for tasks assessed                                        Exercises Total Joint Exercises Ankle Circles/Pumps: AROM;Both;15 reps;Supine Quad Sets: AROM;Both;10 reps;Supine Short Arc Quad: AROM;Right;10 reps;Supine Heel Slides: AAROM;Right;20 reps;Supine Hip ABduction/ADduction: AAROM;Right;Supine;Standing;10 reps(10 each in supine and standing) Long Arc Quad: AROM;10 reps;Right;Seated Knee Flexion: AROM;Right;10 reps;Standing Marching in Standing: AROM;Right;10 reps;Standing Standing Hip Extension: AROM;10 reps;Right;Standing    General Comments        Pertinent Vitals/Pain Pain Score: 4  Pain Location: R hip Pain Descriptors / Indicators: Aching;Sore Pain Intervention(s): Monitored during session;Premedicated before session;Ice applied    Home Living                      Prior Function            PT Goals (current goals can now be found in the care plan section) Acute Rehab PT Goals Patient Stated Goal: 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 : A Little Difficulty sitting down on and standing up from a chair with arms (e.g., wheelchair, bedside commode, etc,.)?: A Little Help needed moving to and from a bed to chair (including a wheelchair)?: A Little Help needed walking in hospital room?: A Little Help needed climbing 3-5 steps with a railing? : A Little 6 Click Score: 18    End of Session Equipment Utilized  During Treatment: Gait belt Activity Tolerance: Patient tolerated treatment well Patient left: with family/visitor present;in chair;with call bell/phone within reach Nurse Communication: Mobility status PT Visit Diagnosis: Difficulty in walking, not elsewhere classified (R26.2)     Time: 4584-8350 PT Time Calculation (min) (ACUTE ONLY): 19 min  Charges:  $Gait Training: 8-22 mins                    G Codes:          Philomena Doheny 04/11/2017, 9:48 AM [phone]

## 2017-04-12 NOTE — Op Note (Signed)
NAME:  James [name], James [name]                      ACCOUNT NO.:  MEDICAL RECORD NO.:  [id number]  LOCATION:                                 FACILITY:  PHYSICIAN:  Lind Guest. Ninfa Linden, M.D.DATE OF BIRTH:  DATE OF PROCEDURE:  04/09/2017 DATE OF DISCHARGE:                              OPERATIVE REPORT   PREOPERATIVE DIAGNOSIS:  Primary osteoarthritis and degenerative joint disease, right hip.  POSTOPERATIVE DIAGNOSIS:  Primary osteoarthritis and degenerative joint disease, right hip.  PROCEDURE:  Right total hip arthroplasty through direct anterior approach.  IMPLANTS:  DePuy Sector Gription acetabular component size 54 with a single screw, size 36 +4 polyethylene liner, size 15 Corail femoral component with varus offset, size 36 +5 ceramic hip ball.  SURGEON:  Lind Guest. Ninfa Linden, MD.  ASSISTANT:  Erskine Emery, PA-C.  ANESTHESIA:  Spinal.  ANTIBIOTICS:  IV Ancef 2 g.  BLOOD LOSS:  500 mL.  COMPLICATIONS:  None.  INDICATIONS:  Mr. Faucett is a 61 year old gentleman with debilitating arthritis involving his right hip.  His right leg is certainly shortened, the left leg as well.  His x-ray showed complete loss of the joint space.  This has been worsening over several years now.  He walks with a Trendelenburg gait.  His pain is daily and it is 10/10.  At this point, it has detrimentally affected his activities of daily living, his quality of life, and his mobility.  He does wish to proceed with a total hip arthroplasty at this point given the failure of conservative treatment.  He understands the risk of acute blood loss anemia, nerve and vessel injury, fracture, infection, dislocation, DVT.  He understands our goals are to decrease pain, improve mobility, and overall improved quality of life.  PROCEDURE DESCRIPTION:  After informed consent was obtained, appropriate right hip was marked.  He was brought to the operating room, where spinal anesthesia was obtained while he  was on a stretcher.  He was then laid in a supine position on the stretcher.  They attempted to place a catheter, but was difficult to place a Foley catheter, so we left it out.  We did measure his leg length and all of his preoperative films standing showed that his leg may be longer.  It is actually clinically shorter on the right operative side than the left.  We took that and note with positioning on the table and our preoperative x-rays and intraoperative x-rays as well.  We placed him supine on the Hana fracture table, the perineal post in place and both legs in InLine skeletal traction devices, but no traction applied.  His right operative hip was prepped and draped with DuraPrep and sterile drapes.  A time-out was called, and he was identified as correct patient and correct right hip.  I then made an incision just inferior and posterior to the anterior superior iliac spine and carried this obliquely down the leg. We dissected down the tensor fascia lata muscle.  Tensor fascia was then divided longitudinally to proceed with a direct anterior approach to the hip.  We identified and cauterized circumflex vessels and identified the hip capsule.  I opened up  the hip capsule in an L-type format, finding a moderate joint effusion and significant arthritis throughout his right hip.  We placed Cobra retractor on the medial and lateral femoral neck and then made our femoral neck cut with an oscillating saw just proximal to lesser trochanter and completed this on osteotome.  We placed a corkscrew guide in the femoral head and removed the femoral head in its entirety and found it to be completely devoid of cartilage.  We then placed a bent Hohmann over the medial acetabular rim and removed remnants of acetabular labrum and periarticular osteophytes.  We then began reaming from a size 43 reamer in stepwise increments up to a size 54 with all reamers under direct visualization, the last reamer  under direct fluoroscopy as well so we could obtain our depth of reaming, our inclination, and anteversion.  Once we were pleased with this, we placed the real DePuy Sector Gription acetabular component size 54 and a 36 +4 neutral polyethylene liner for that size acetabular component. Attention was then turned to the femur.  With the leg externally rotated to 120 degrees extended and adducted, we were able to place a Mueller retractor medially and a Hohmann retractor behind the greater trochanter.  We released the lateral joint capsule and used a box cutting osteotome in the inner femoral canal and a rongeur to lateralize.  We then began broaching from a size 8 broach using the Corail broaching system going up to a size 15.  With a 15 in place, based off his anatomy, we trialed a varus offset femoral neck and then a 36 +5 hip ball anticipating his leg length discrepancy reduced this in the acetabulum.  We were pleased with leg length, offset, range of motion, and stability.  We then dislocated the hip and removed the trial components.  We were able to place the real Corail femoral component with varus offset, size 15, and the real 36 +5 ceramic hip ball.  Again, we reduced this in the acetabulum.  We were pleased with leg length, stability offset, and positioning under direct fluoroscopy and physical clinical exam.  We then irrigated the soft tissue with normal saline solution.  We were able to close the joint capsule with interrupted #1 Ethibond suture, followed by running #1 Vicryl in the tensor fascia, 0 Vicryl in the deep tissue, 2-0 Vicryl in the subcutaneous tissue, 4-0 Monocryl subcuticular stitch and Steri-Strips on the skin.  An Aquacel dressing was applied.  He was taken off the Hana table, taken to the recovery room in stable condition.  All final counts were correct. There were no complications noted.     Lind Guest. Ninfa Linden, M.D.     CYB/MEDQ  D:  04/09/2017  T:   04/09/2017  Job:  [id number]

## 2017-04-22 ENCOUNTER — Encounter (INDEPENDENT_AMBULATORY_CARE_PROVIDER_SITE_OTHER): Payer: Self-pay | Admitting: Orthopaedic Surgery

## 2017-04-22 ENCOUNTER — Ambulatory Visit (INDEPENDENT_AMBULATORY_CARE_PROVIDER_SITE_OTHER): Payer: BC Managed Care – PPO | Admitting: Orthopaedic Surgery

## 2017-04-22 DIAGNOSIS — Z96641 Presence of right artificial hip joint: Secondary | ICD-10-CM

## 2017-04-22 MED ORDER — HYDROCODONE-ACETAMINOPHEN 5-325 MG PO TABS: 1.0000 | ORAL_TABLET | Freq: Four times a day (QID) | ORAL | 0 refills | Status: DC | PRN

## 2017-04-22 NOTE — Progress Notes (Signed)
The patient is now 2 weeks tomorrow status post right total hip arthroplasty through direct anterior approach.  He is walking without assistive device and doing well.  He said he is ready back to his once daily 81 mg aspirin which I agree with.  He is ready to drive as well.  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  He has had no other significant issues.  He actually is ready to wean from his pain medication to a less strong pain medication.  On examination his leg lengths are equal.  He tells me putting his right hip to range of motion.  His incision looks really good and there is no seroma.  I removed the old Steri-Strips in place a new ones.  At this point I did refill his pain medication but went down to hydrocodone.  We will see him back in 4 to see how is doing overall but no x-rays are needed.

## 2017-05-20 ENCOUNTER — Ambulatory Visit (INDEPENDENT_AMBULATORY_CARE_PROVIDER_SITE_OTHER): Payer: BC Managed Care – PPO | Admitting: Orthopaedic Surgery

## 2017-05-20 ENCOUNTER — Encounter (INDEPENDENT_AMBULATORY_CARE_PROVIDER_SITE_OTHER): Payer: Self-pay | Admitting: Orthopaedic Surgery

## 2017-05-20 DIAGNOSIS — Z96641 Presence of right artificial hip joint: Secondary | ICD-10-CM

## 2017-05-20 NOTE — Progress Notes (Signed)
The patient is now 6-7 weeks status post a right total hip arthroplasty through direct anterior approach.  He 62 years old.  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  He is walking without assistive device.  He stopped pain medications back in December.  He says he has no issues.  On exam I can easily put his right hip the range of motion with no difficulty at all.  His leg lengths are equal.  Is not walking with a limp.  At this point I do not need to see him back until 10 months from now.  At that visit I like a low AP pelvis and lateral of his right operative hip.  All questions concerns were answered and addressed.

## 2017-07-06 ENCOUNTER — Ambulatory Visit (INDEPENDENT_AMBULATORY_CARE_PROVIDER_SITE_OTHER): Payer: BC Managed Care – PPO | Admitting: Family Medicine

## 2017-07-06 ENCOUNTER — Encounter: Payer: Self-pay | Admitting: Family Medicine

## 2017-07-06 VITALS — BP 134/85 | HR 71 | Temp 97.5°F | Wt 149.4 lb

## 2017-07-06 DIAGNOSIS — J029 Acute pharyngitis, unspecified: Secondary | ICD-10-CM | POA: Diagnosis not present

## 2017-07-06 NOTE — Progress Notes (Signed)
BP 134/85 (BP Location: Left Arm, Patient Position: Sitting, Cuff Size: Normal)   Pulse 71   Temp (!) 97.5 F (36.4 C) (Oral)   Wt 149 lb 6 oz (67.8 kg)   SpO2 98%   BMI 22.71 kg/m    Subjective:    Patient ID: James [name], male    DOB: 08-20-1955, 62 y.o.   MRN: [id number]  HPI: James [name] is a 62 y.o. male  Chief Complaint  Patient presents with  . Sore Throat    pain in throat  . Cough    productive, clear   Had hip replacement in November and started with gravely voice, sore throat and cough around that time. He has had a clogged and congested nose. He has not had any fevers. Started feeling worse a few days ago. Has been feeling a lump in his throat for the past few days. No trouble swallowing.  UPPER RESPIRATORY TRACT INFECTION Worst symptom: Fever: no Cough: yes- worse at night when laying down Shortness of breath: yes Wheezing: no Chest pain: no Chest tightness: yes Chest congestion: yes Nasal congestion: yes Runny nose: yes Post nasal drip: yes Sneezing: no Sore throat: yes Swollen glands: no Sinus pressure: no Headache: no Face pain: no Toothache: no Ear pain: no  Ear pressure: no  Eyes red/itching:no Eye drainage/crusting: no  Vomiting: no Rash: no Fatigue: no Sick contacts: no Strep contacts: no  Context: worse Recurrent sinusitis: no Relief with OTC cold/cough medications: no  Treatments attempted: none    Relevant past medical, surgical, family and social history reviewed and updated as indicated. Interim medical history since our last visit reviewed. Allergies and medications reviewed and updated.  Review of Systems  Constitutional: Negative.   HENT: Positive for congestion, postnasal drip, rhinorrhea, sore throat and voice change. Negative for dental problem, drooling, ear discharge, ear pain, facial swelling, hearing loss, mouth sores, nosebleeds, sinus pressure, sinus pain, sneezing, tinnitus and trouble swallowing.   Eyes:  Negative.   Respiratory: Positive for shortness of breath. Negative for apnea, cough, choking, chest tightness, wheezing and stridor.   Cardiovascular: Negative.   Psychiatric/Behavioral: Negative.     Per HPI unless specifically indicated above     Objective:    BP 134/85 (BP Location: Left Arm, Patient Position: Sitting, Cuff Size: Normal)   Pulse 71   Temp (!) 97.5 F (36.4 C) (Oral)   Wt 149 lb 6 oz (67.8 kg)   SpO2 98%   BMI 22.71 kg/m   Wt Readings from Last 3 Encounters:  07/06/17 149 lb 6 oz (67.8 kg)  04/09/17 145 lb 16 oz (66.2 kg)  04/06/17 146 lb (66.2 kg)    Physical Exam  Constitutional: He is oriented to person, place, and time. He appears well-developed and well-nourished. No distress.  HENT:  Head: Normocephalic and atraumatic.  Right Ear: Hearing, external ear and ear canal normal. A middle ear effusion is present.  Left Ear: Hearing, external ear and ear canal normal. A middle ear effusion is present.  Nose: Nose normal.  Mouth/Throat: Oropharynx is clear and moist. No oropharyngeal exudate.  Large tonsil stone on the R tonsil  Eyes: Conjunctivae, EOM and lids are normal. Pupils are equal, round, and reactive to light. Right eye exhibits no discharge. Left eye exhibits no discharge. No scleral icterus.  Neck: Normal range of motion. Neck supple. No JVD present. No tracheal deviation present. No thyromegaly present.  Cardiovascular: Normal rate, regular rhythm, normal heart sounds and intact distal pulses.  Exam reveals no gallop and no friction rub.  No murmur heard. Pulmonary/Chest: Effort normal and breath sounds normal. No stridor. No respiratory distress. He has no wheezes. He has no rales. He exhibits no tenderness.  Musculoskeletal: Normal range of motion.  Lymphadenopathy:    He has cervical adenopathy.  Neurological: He is alert and oriented to person, place, and time.  Skin: Skin is warm, dry and intact. No rash noted. He is not diaphoretic. No  erythema. No pallor.  Psychiatric: He has a normal mood and affect. His speech is normal and behavior is normal. Judgment and thought content normal. Cognition and memory are normal.  Nursing note and vitals reviewed.   Results for orders placed or performed during the hospital encounter of 04/09/17  CBC  Result Value Ref Range   WBC 6.0 4.0 - 10.5 K/uL   RBC 2.98 (L) 4.22 - 5.81 MIL/uL   Hemoglobin 9.8 (L) 13.0 - 17.0 g/dL   HCT 28.8 (L) 39.0 - 52.0 %   MCV 96.6 78.0 - 100.0 fL   MCH 32.9 26.0 - 34.0 pg   MCHC 34.0 30.0 - 36.0 g/dL   RDW 12.7 11.5 - 15.5 %   Platelets 165 150 - 400 K/uL  Basic metabolic panel  Result Value Ref Range   Sodium 133 (L) 135 - 145 mmol/L   Potassium 3.9 3.5 - 5.1 mmol/L   Chloride 102 101 - 111 mmol/L   CO2 26 22 - 32 mmol/L   Glucose, Bld 112 (H) 65 - 99 mg/dL   BUN 11 6 - 20 mg/dL   Creatinine, Ser 0.92 0.61 - 1.24 mg/dL   Calcium 8.4 (L) 8.9 - 10.3 mg/dL   GFR calc non Af Amer >60 >60 mL/min   GFR calc Af Amer >60 >60 mL/min   Anion gap 5 5 - 15  CBC  Result Value Ref Range   WBC 7.2 4.0 - 10.5 K/uL   RBC 2.96 (L) 4.22 - 5.81 MIL/uL   Hemoglobin 9.8 (L) 13.0 - 17.0 g/dL   HCT 28.6 (L) 39.0 - 52.0 %   MCV 96.6 78.0 - 100.0 fL   MCH 33.1 26.0 - 34.0 pg   MCHC 34.3 30.0 - 36.0 g/dL   RDW 12.7 11.5 - 15.5 %   Platelets 157 150 - 400 K/uL      Assessment & Plan:   Problem List Items Addressed This Visit    None    Visit Diagnoses    Sore throat    -  Primary   Likely due to tonsil stone. Will treat with hot water and salt and diluted peroxide. Call if not getting better or getting worse. Negative Strep.   Relevant Orders   Rapid Strep Screen (Not at [facility])       Follow up plan: Return if symptoms worsen or fail to improve.

## 2017-07-06 NOTE — Patient Instructions (Addendum)
2 tbs salt in 1 glass of water 2tbs peroxide in 1 glass of water- DON'T Swallow  Tonsillitis Tonsillitis is an infection of the throat that causes the tonsils to become red, tender, and swollen. Tonsils are collections of lymphoid tissue at the back of the throat. Each tonsil has crevices (crypts). Tonsils help fight nose and throat infections and keep infection from spreading to other parts of the body for the first 18 months of life. What are the causes? Sudden (acute) tonsillitis is usually caused by infection with streptococcal bacteria. Long-lasting (chronic) tonsillitis occurs when the crypts of the tonsils become filled with pieces of food and bacteria, which makes it easy for the tonsils to become repeatedly infected. What are the signs or symptoms? Symptoms of tonsillitis include:  A sore throat, with possible difficulty swallowing.  White patches on the tonsils.  Fever.  Tiredness.  New episodes of snoring during sleep, when you did not snore before.  Small, foul-smelling, yellowish-white pieces of material (tonsilloliths) that you occasionally cough up or spit out. The tonsilloliths can also cause you to have bad breath.  How is this diagnosed? Tonsillitis can be diagnosed through a physical exam. Diagnosis can be confirmed with the results of lab tests, including a throat culture. How is this treated? The goals of tonsillitis treatment include the reduction of the severity and duration of symptoms and prevention of associated conditions. Symptoms of tonsillitis can be improved with the use of steroids to reduce the swelling. Tonsillitis caused by bacteria can be treated with antibiotic medicines. Usually, treatment with antibiotic medicines is started before the cause of the tonsillitis is known. However, if it is determined that the cause is not bacterial, antibiotic medicines will not treat the tonsillitis. If attacks of tonsillitis are severe and frequent, your health care  provider may recommend surgery to remove the tonsils (tonsillectomy). Follow these instructions at home:  Rest as much as possible and get plenty of sleep.  Drink plenty of fluids. While the throat is very sore, eat soft foods or liquids, such as sherbet, soups, or instant breakfast drinks.  Eat frozen ice pops.  Gargle with a warm or cold liquid to help soothe the throat. Mix 1/4 teaspoon of salt and 1/4 teaspoon of baking soda in 8 oz of water. Contact a health care provider if:  Large, tender lumps develop in your neck.  A rash develops.  A green, yellow-brown, or bloody substance is coughed up.  You are unable to swallow liquids or food for 24 hours.  You notice that only one of the tonsils is swollen. Get help right away if:  You develop any new symptoms such as vomiting, severe headache, stiff neck, chest pain, or trouble breathing or swallowing.  You have severe throat pain along with drooling or voice changes.  You have severe pain, unrelieved with recommended medications.  You are unable to fully open the mouth.  You develop redness, swelling, or severe pain anywhere in the neck.  You have a fever. This information is not intended to replace advice given to you by your health care provider. Make sure you discuss any questions you have with your health care provider. Document Released: 02/11/2005 Document Revised: 10/10/2015 Document Reviewed: 10/21/2012 Elsevier Interactive Patient Education  2017 [employer].

## 2017-07-09 LAB — CULTURE, GROUP A STREP: STREP A CULTURE: NEGATIVE

## 2017-07-09 LAB — RAPID STREP SCREEN (MED CTR MEBANE ONLY): Strep Gp A Ag, IA W/Reflex: NEGATIVE

## 2017-08-12 ENCOUNTER — Encounter: Payer: BC Managed Care – PPO | Admitting: Family Medicine

## 2017-08-24 ENCOUNTER — Ambulatory Visit (INDEPENDENT_AMBULATORY_CARE_PROVIDER_SITE_OTHER): Payer: BC Managed Care – PPO | Admitting: Family Medicine

## 2017-08-24 ENCOUNTER — Encounter: Payer: Self-pay | Admitting: Family Medicine

## 2017-08-24 VITALS — BP 119/80 | HR 93 | Temp 97.9°F | Wt 147.6 lb

## 2017-08-24 DIAGNOSIS — R05 Cough: Secondary | ICD-10-CM | POA: Diagnosis not present

## 2017-08-24 DIAGNOSIS — R059 Cough, unspecified: Secondary | ICD-10-CM

## 2017-08-24 MED ORDER — AZITHROMYCIN 250 MG PO TABS
ORAL_TABLET | ORAL | 0 refills | Status: DC
Start: 2017-08-24 — End: 2017-11-08

## 2017-08-24 MED ORDER — PREDNISONE 10 MG PO TABS: ORAL_TABLET | ORAL | 0 refills | Status: DC

## 2017-08-24 NOTE — Progress Notes (Signed)
BP 119/80 (BP Location: Left Arm, Patient Position: Sitting, Cuff Size: Normal)   Pulse 93   Temp 97.9 F (36.6 C) (Oral)   Wt 147 lb 9 oz (66.9 kg)   SpO2 98%   BMI 22.44 kg/m    Subjective:    Patient ID: James [name], male    DOB: 1956/03/13, 62 y.o.   MRN: [id number]  HPI: James [name] is a 62 y.o. male  Chief Complaint  Patient presents with  . Cough    clear and productive.  Has been occuring since hip replacement.  Changes in breathing.   COUGH Duration: 4-5 months Circumstances of initial development of cough: Had his hip replaced, and everything was fine, but ever since then, feels like he is really coughy and phlegmy Cough severity: moderate in the AM, OK to during the day Cough description: productive, hacking and wet Aggravating factors:  worse in the AM Alleviating factors: nothing Status:  stable Treatments attempted: cold/sinus and mucinex, anti-histamine Wheezing: yes Shortness of breath: no Chest pain: no Chest tightness:no Nasal congestion: yes Runny nose: no Postnasal drip: no Frequent throat clearing or swallowing: no Hemoptysis: no Fevers: no Night sweats: no Weight loss: yes- has been fluctating Heartburn: no Recent foreign travel: no Tuberculosis contacts: no  Relevant past medical, surgical, family and social history reviewed and updated as indicated. Interim medical history since our last visit reviewed. Allergies and medications reviewed and updated.  Review of Systems  Constitutional: Negative.   HENT: Positive for congestion and postnasal drip. Negative for dental problem, drooling, ear discharge, ear pain, facial swelling, hearing loss, mouth sores, nosebleeds, rhinorrhea, sinus pressure, sinus pain, sneezing, sore throat, tinnitus, trouble swallowing and voice change.   Eyes: Negative.   Respiratory: Positive for cough and wheezing. Negative for apnea, choking, chest tightness, shortness of breath and stridor.   Cardiovascular:  Negative.   Psychiatric/Behavioral: Negative.     Per HPI unless specifically indicated above     Objective:    BP 119/80 (BP Location: Left Arm, Patient Position: Sitting, Cuff Size: Normal)   Pulse 93   Temp 97.9 F (36.6 C) (Oral)   Wt 147 lb 9 oz (66.9 kg)   SpO2 98%   BMI 22.44 kg/m   Wt Readings from Last 3 Encounters:  08/24/17 147 lb 9 oz (66.9 kg)  07/06/17 149 lb 6 oz (67.8 kg)  04/09/17 146 lb (66.2 kg)    Physical Exam  Constitutional: He is oriented to person, place, and time. He appears well-developed. He appears cachectic. No distress.  HENT:  Head: Normocephalic and atraumatic.  Right Ear: Hearing and external ear normal.  Left Ear: Hearing and external ear normal.  Nose: Nose normal.  Mouth/Throat: Oropharynx is clear and moist. No oropharyngeal exudate.  Eyes: Pupils are equal, round, and reactive to light. Conjunctivae, EOM and lids are normal. Right eye exhibits no discharge. Left eye exhibits no discharge. No scleral icterus.  Neck: Normal range of motion. Neck supple. No JVD present. No tracheal deviation present. No thyromegaly present.  Cardiovascular: Normal rate, regular rhythm, normal heart sounds and intact distal pulses. Exam reveals no gallop and no friction rub.  No murmur heard. Pulmonary/Chest: Effort normal. No stridor. No respiratory distress. He has decreased breath sounds in the right upper field, the right middle field, the right lower field, the left upper field, the left middle field and the left lower field. He has no wheezes. He has no rales. He exhibits no tenderness.  Coarse breath  sounds throughout  Musculoskeletal: Normal range of motion.  Lymphadenopathy:    He has cervical adenopathy.  Neurological: He is alert and oriented to person, place, and time.  Skin: Skin is warm, dry and intact. Capillary refill takes less than 2 seconds. No rash noted. He is not diaphoretic. No erythema. No pallor.  Psychiatric: He has a normal mood  and affect. His speech is normal and behavior is normal. Judgment and thought content normal. Cognition and memory are normal.  Nursing note and vitals reviewed.   Results for orders placed or performed in visit on 07/06/17  Rapid Strep Screen (Not at [facility])  Result Value Ref Range   Strep Gp A Ag, IA W/Reflex Negative Negative  Culture, Group A Strep  Result Value Ref Range   Strep A Culture Negative       Assessment & Plan:   Problem List Items Addressed This Visit    None    Visit Diagnoses    Cough    -  Primary   Will check labs and CXR. Will treat for bronchitis with prednisone and azithromycin. Call with concerns. Recheck lungs 2 weeks, spiro if not better.    Relevant Orders   Comprehensive metabolic panel   CBC with Differential/Platelet   QuantiFERON-TB Gold Plus   DG Chest 2 View       Follow up plan: Return in about 2 weeks (around 09/07/2017) for Lung recheck.

## 2017-08-25 ENCOUNTER — Encounter: Payer: Self-pay | Admitting: Family Medicine

## 2017-08-25 LAB — CBC WITH DIFFERENTIAL/PLATELET
BASOS ABS: 0 10*3/uL (ref 0.0–0.2)
Basos: 1 %
EOS (ABSOLUTE): 0.3 10*3/uL (ref 0.0–0.4)
Eos: 5 %
HEMOGLOBIN: 13.3 g/dL (ref 13.0–17.7)
Hematocrit: 40.5 % (ref 37.5–51.0)
IMMATURE GRANS (ABS): 0 10*3/uL (ref 0.0–0.1)
Immature Granulocytes: 0 %
LYMPHS: 25 %
Lymphocytes Absolute: 1.5 10*3/uL (ref 0.7–3.1)
MCH: 32.5 pg (ref 26.6–33.0)
MCHC: 32.8 g/dL (ref 31.5–35.7)
MCV: 99 fL — ABNORMAL HIGH (ref 79–97)
MONOCYTES: 7 %
Monocytes Absolute: 0.4 10*3/uL (ref 0.1–0.9)
Neutrophils Absolute: 3.8 10*3/uL (ref 1.4–7.0)
Neutrophils: 62 %
PLATELETS: 268 10*3/uL (ref 150–379)
RBC: 4.09 x10E6/uL — AB (ref 4.14–5.80)
RDW: 13.7 % (ref 12.3–15.4)
WBC: 6 10*3/uL (ref 3.4–10.8)

## 2017-08-25 LAB — COMPREHENSIVE METABOLIC PANEL
ALK PHOS: 57 IU/L (ref 39–117)
ALT: 12 IU/L (ref 0–44)
AST: 18 IU/L (ref 0–40)
Albumin/Globulin Ratio: 1.6 (ref 1.2–2.2)
Albumin: 4.2 g/dL (ref 3.6–4.8)
BILIRUBIN TOTAL: 0.2 mg/dL (ref 0.0–1.2)
BUN/Creatinine Ratio: 17 (ref 10–24)
BUN: 18 mg/dL (ref 8–27)
CHLORIDE: 100 mmol/L (ref 96–106)
CO2: 23 mmol/L (ref 20–29)
Calcium: 9.4 mg/dL (ref 8.6–10.2)
Creatinine, Ser: 1.06 mg/dL (ref 0.76–1.27)
GFR calc Af Amer: 86 mL/min/{1.73_m2} (ref >59)
GFR calc non Af Amer: 74 mL/min/{1.73_m2} (ref >59)
GLUCOSE: 85 mg/dL (ref 65–99)
Globulin, Total: 2.6 g/dL (ref 1.5–4.5)
Potassium: 5 mmol/L (ref 3.5–5.2)
Sodium: 138 mmol/L (ref 134–144)
TOTAL PROTEIN: 6.8 g/dL (ref 6.0–8.5)

## 2017-08-27 ENCOUNTER — Telehealth: Payer: Self-pay | Admitting: Family Medicine

## 2017-08-27 LAB — QUANTIFERON-TB GOLD PLUS
QUANTIFERON NIL VALUE: 0.01 [IU]/mL
QUANTIFERON TB1 AG VALUE: 0.01 [IU]/mL
QUANTIFERON TB2 AG VALUE: 0.01 [IU]/mL
QuantiFERON Mitogen Value: 7.02 IU/mL
QuantiFERON-TB Gold Plus: NEGATIVE

## 2017-08-27 NOTE — Telephone Encounter (Signed)
Called to let him know that his TB test was negative. LMOM for him to call back. CRM generated. OK to give him this message if he calls back.

## 2017-08-31 NOTE — Telephone Encounter (Signed)
Called and left patient a VM asking for him to please return my call.  

## 2017-08-31 NOTE — Telephone Encounter (Signed)
Can we try him again?

## 2017-09-01 NOTE — Telephone Encounter (Signed)
Called and left a message asking patient to return my call.  

## 2017-09-01 NOTE — Telephone Encounter (Signed)
Patient notified, patient states that he has finish all the antibiotics and does not feel that it is necessary to have a chest xray done.

## 2017-09-01 NOTE — Telephone Encounter (Signed)
Noted  

## 2017-09-07 ENCOUNTER — Ambulatory Visit: Payer: BC Managed Care – PPO | Admitting: Family Medicine

## 2017-11-01 ENCOUNTER — Telehealth: Payer: Self-pay | Admitting: Family Medicine

## 2017-11-01 ENCOUNTER — Ambulatory Visit
Admission: RE | Admit: 2017-11-01 | Discharge: 2017-11-01 | Disposition: A | Discharge status: Home / Self Care | Payer: BC Managed Care – PPO | Source: Ambulatory Visit | Attending: Family Medicine | Admitting: Family Medicine

## 2017-11-01 DIAGNOSIS — R918 Other nonspecific abnormal finding of lung field: Secondary | ICD-10-CM | POA: Insufficient documentation

## 2017-11-01 DIAGNOSIS — R05 Cough: Secondary | ICD-10-CM | POA: Insufficient documentation

## 2017-11-01 DIAGNOSIS — R059 Cough, unspecified: Secondary | ICD-10-CM

## 2017-11-01 DIAGNOSIS — J984 Other disorders of lung: Secondary | ICD-10-CM | POA: Insufficient documentation

## 2017-11-01 DIAGNOSIS — R9389 Abnormal findings on diagnostic imaging of other specified body structures: Secondary | ICD-10-CM

## 2017-11-01 NOTE — Telephone Encounter (Signed)
Patient notified

## 2017-11-01 NOTE — Telephone Encounter (Signed)
He would like a call to discuss the results.

## 2017-11-01 NOTE — Telephone Encounter (Signed)
See other telephone encounter from earlier in the day

## 2017-11-01 NOTE — Telephone Encounter (Signed)
[facility] [place]  Friday June 21,2019 at 11:00 arrive at 10:45am.

## 2017-11-01 NOTE — Telephone Encounter (Signed)
Called and spoke to patient about the results. He would like to get the CT scan this week if possible as he is pretty stressed about it. Please see about getting CT scheduled.

## 2017-11-01 NOTE — Telephone Encounter (Signed)
Please let him know that his x-ray was abnormal, so we need to get a CT scan to find out what's going on. I'd like him to get it sooner rather than later. Order placed.

## 2017-11-01 NOTE — Telephone Encounter (Signed)
Copied from [place] [id number]. Topic: General - Other >> Nov 01, 2017 12:15 PM Mcneil, Ja-Kwan wrote: Reason for CRM: Pt states he had an x-ray and would like to know what was found. Pt requests a return call. Cb# [phone]

## 2017-11-05 ENCOUNTER — Telehealth: Payer: Self-pay | Admitting: Family Medicine

## 2017-11-05 ENCOUNTER — Ambulatory Visit
Admission: RE | Admit: 2017-11-05 | Discharge: 2017-11-05 | Disposition: A | Discharge status: Home / Self Care | Payer: BC Managed Care – PPO | Source: Ambulatory Visit | Attending: Family Medicine | Admitting: Family Medicine

## 2017-11-05 DIAGNOSIS — R9389 Abnormal findings on diagnostic imaging of other specified body structures: Secondary | ICD-10-CM | POA: Diagnosis not present

## 2017-11-05 DIAGNOSIS — R918 Other nonspecific abnormal finding of lung field: Secondary | ICD-10-CM

## 2017-11-05 DIAGNOSIS — C3490 Malignant neoplasm of unspecified part of unspecified bronchus or lung: Secondary | ICD-10-CM | POA: Diagnosis not present

## 2017-11-05 DIAGNOSIS — I2510 Atherosclerotic heart disease of native coronary artery without angina pectoris: Secondary | ICD-10-CM | POA: Diagnosis not present

## 2017-11-05 DIAGNOSIS — I700 Atherosclerosis of aorta: Secondary | ICD-10-CM | POA: Diagnosis not present

## 2017-11-05 NOTE — Telephone Encounter (Signed)
James [name]:  Verbal CT scan: calling to make sure scan can be viewed patient has some positive results

## 2017-11-05 NOTE — Telephone Encounter (Signed)
Please see about getting him an appointment ASAP. I will call him and give him the results

## 2017-11-05 NOTE — Telephone Encounter (Signed)
Please advise thank you

## 2017-11-05 NOTE — Telephone Encounter (Signed)
I've got it. Thanks!

## 2017-11-08 ENCOUNTER — Ambulatory Visit (INDEPENDENT_AMBULATORY_CARE_PROVIDER_SITE_OTHER): Payer: BC Managed Care – PPO | Admitting: Family Medicine

## 2017-11-08 ENCOUNTER — Encounter: Payer: Self-pay | Admitting: Family Medicine

## 2017-11-08 VITALS — BP 141/84 | HR 84 | Temp 98.5°F | Wt 142.5 lb

## 2017-11-08 DIAGNOSIS — R918 Other nonspecific abnormal finding of lung field: Secondary | ICD-10-CM | POA: Diagnosis not present

## 2017-11-08 NOTE — Assessment & Plan Note (Signed)
Discussed results with patient today. Will get him into cancer center ASAP- referral has already been generated. Working on trying to get him an appointment. Will get him into see pulmonology for evaluation ASAP. Referral to them generated today. Questions answered today, but patient is anxious and would like to see specialists ASAP.

## 2017-11-08 NOTE — Progress Notes (Signed)
BP (!) 141/84   Pulse 84   Temp 98.5 F (36.9 C) (Oral)   Wt 142 lb 8 oz (64.6 kg)   SpO2 98%   BMI 21.67 kg/m    Subjective:    Patient ID: James [name], male    DOB: [birth year], 62 y.o.   MRN: [id number]  HPI: James [name] is a 62 y.o. male  Chief Complaint  Patient presents with  . Results   Patient here today to discuss results of CT scan done on Friday, which showed mass concerning for bronchogenic carcinoma. He is anxious as he received a call from the cancer center before his appointment today, but did not schedule an appointment as he didn't know why they were calling. Besides that he is feeling relatively well. He has continued to lose weight and is down another 5lbs in the past 2.5 months.   Relevant past medical, surgical, family and social history reviewed and updated as indicated. Interim medical history since our last visit reviewed. Allergies and medications reviewed and updated.  Review of Systems  Constitutional: Negative.   Respiratory: Negative.   Cardiovascular: Negative.   Gastrointestinal: Negative.   Neurological: Negative.   Psychiatric/Behavioral: Negative.     Per HPI unless specifically indicated above     Objective:    BP (!) 141/84   Pulse 84   Temp 98.5 F (36.9 C) (Oral)   Wt 142 lb 8 oz (64.6 kg)   SpO2 98%   BMI 21.67 kg/m   Wt Readings from Last 3 Encounters:  11/08/17 142 lb 8 oz (64.6 kg)  08/24/17 147 lb 9 oz (66.9 kg)  07/06/17 149 lb 6 oz (67.8 kg)    Physical Exam  Constitutional: He is oriented to person, place, and time. He appears well-developed and well-nourished. No distress.  HENT:  Head: Normocephalic and atraumatic.  Right Ear: Hearing normal.  Left Ear: Hearing normal.  Nose: Nose normal.  Eyes: Conjunctivae and lids are normal. Right eye exhibits no discharge. Left eye exhibits no discharge. No scleral icterus.  Cardiovascular: Normal rate, regular rhythm, normal heart sounds and intact distal pulses. Exam  reveals no gallop and no friction rub.  No murmur heard. Pulmonary/Chest: Effort normal. No stridor. No respiratory distress. He has decreased breath sounds in the right upper field and the right middle field. He has no wheezes. He has no rales. He exhibits no tenderness.  Musculoskeletal: Normal range of motion.  Neurological: He is alert and oriented to person, place, and time.  Skin: Skin is intact. No rash noted. He is not diaphoretic.  Psychiatric: He has a normal mood and affect. His speech is normal and behavior is normal. Judgment and thought content normal. Cognition and memory are normal.   CLINICAL DATA:  Evaluate lung mass.  EXAM: CT CHEST WITHOUT CONTRAST  TECHNIQUE: Multidetector CT imaging of the chest was performed following the standard protocol without IV contrast.  COMPARISON:  Chest radiograph 11/01/2017  FINDINGS: Cardiovascular: Normal heart size. No pericardial effusion. Aortic atherosclerosis. Calcification in the LAD and left circumflex coronary arteries noted.  Mediastinum/Nodes: Normal appearance of the thyroid gland. The trachea appears patent and is midline. Normal appearance of the esophagus.  Enlarged left hilar lymph node measures 1.8 cm, image 71/2. Subcarinal lymph node measures 1.6 cm, image 89/2.  Lungs/Pleura: The hilar regions are suboptimally evaluated due to lack of IV contrast material. There is increased soft tissue involving the right upper lobe bronchi worrisome for tumor, image 68/5. The margins  of which are difficult to define. I suspect this lesion measures approximately 4.0 by 3.0 cm, image 67/2. There are several nodules identified within the right upper lobe which appears solid. Index nodule measures 1 cm, image 41/3. More posteriorly there is a 2.1 by 1.4 cm nodule, image 41/3. Round solid nodule within the superior segment of right lower lobe measures 6 mm, image 65/3. Other scattered small solid nodules are identified  in both lungs. There are also patchy areas of airspace consolidation and ground-glass attenuation which is nonspecific in may be inflammatory or infectious in etiology. Metastatic disease not excluded.  Upper Abdomen: There is a low-attenuation mass within the posterior right lobe of liver measuring 2.5 cm. This is new when compared with 01/28/2016.  Musculoskeletal: Well defined sclerotic lesion within the upper thoracic spine measures 9 mm, image 100/7. This has a benign nonaggressive appearance. No aggressive lytic or sclerotic bone lesions identified.  IMPRESSION: 1. There is asymmetric increased soft tissue surrounding the right upper lobe bronchi which is suspicious for bronchogenic carcinoma. Further evaluation with PET-CT and tissue sampling is recommended. 2. Scattered solid round nodules are identified within both lungs, which in the setting of bronchogenic carcinoma would be suspicious for metastatic disease. 3. Enlarged left paratracheal and subcarinal lymph nodes, which in the setting of lung cancer would be suspicious for metastatic adenopathy. 4. Low-attenuation lesion within posterior right lobe of liver is new compared with 01/28/2016 and is suspicious for an area of metastatic disease. 5. Multifocal areas of ground-glass attenuation and irregular nodularity are noted throughout both lungs. Findings are nonspecific and may be inflammatory or infectious in etiology. Metastatic disease not excluded. 6. Aortic atherosclerosis with LAD and left circumflex coronary artery atherosclerotic calcifications. Aortic Atherosclerosis (ICD10-I70.0). 7. These results will be called to the ordering clinician or representative by the Radiologist Assistant, and communication documented in the PACS or zVision Dashboard.  Results for orders placed or performed in visit on 08/24/17  Comprehensive metabolic panel  Result Value Ref Range   Glucose 85 65 - 99 mg/dL   BUN 18 8 -  27 mg/dL   Creatinine, Ser 1.06 0.76 - 1.27 mg/dL   GFR calc non Af Amer 74 >59 mL/min/1.73   GFR calc Af Amer 86 >59 mL/min/1.73   BUN/Creatinine Ratio 17 10 - 24   Sodium 138 134 - 144 mmol/L   Potassium 5.0 3.5 - 5.2 mmol/L   Chloride 100 96 - 106 mmol/L   CO2 23 20 - 29 mmol/L   Calcium 9.4 8.6 - 10.2 mg/dL   Total Protein 6.8 6.0 - 8.5 g/dL   Albumin 4.2 3.6 - 4.8 g/dL   Globulin, Total 2.6 1.5 - 4.5 g/dL   Albumin/Globulin Ratio 1.6 1.2 - 2.2   Bilirubin Total 0.2 0.0 - 1.2 mg/dL   Alkaline Phosphatase 57 39 - 117 IU/L   AST 18 0 - 40 IU/L   ALT 12 0 - 44 IU/L  CBC with Differential/Platelet  Result Value Ref Range   WBC 6.0 3.4 - 10.8 x10E3/uL   RBC 4.09 (L) 4.14 - 5.80 x10E6/uL   Hemoglobin 13.3 13.0 - 17.7 g/dL   Hematocrit 40.5 37.5 - 51.0 %   MCV 99 (H) 79 - 97 fL   MCH 32.5 26.6 - 33.0 pg   MCHC 32.8 31.5 - 35.7 g/dL   RDW 13.7 12.3 - 15.4 %   Platelets 268 150 - 379 x10E3/uL   Neutrophils 62 Not Estab. %   Lymphs 25 Not  Estab. %   Monocytes 7 Not Estab. %   Eos 5 Not Estab. %   Basos 1 Not Estab. %   Neutrophils Absolute 3.8 1.4 - 7.0 x10E3/uL   Lymphocytes Absolute 1.5 0.7 - 3.1 x10E3/uL   Monocytes Absolute 0.4 0.1 - 0.9 x10E3/uL   EOS (ABSOLUTE) 0.3 0.0 - 0.4 x10E3/uL   Basophils Absolute 0.0 0.0 - 0.2 x10E3/uL   Immature Granulocytes 0 Not Estab. %   Immature Grans (Abs) 0.0 0.0 - 0.1 x10E3/uL  QuantiFERON-TB Gold Plus  Result Value Ref Range   QuantiFERON Incubation Incubation performed.    QuantiFERON Criteria Comment    QuantiFERON TB1 Ag Value 0.01 IU/mL   QuantiFERON TB2 Ag Value 0.01 IU/mL   QuantiFERON Nil Value 0.01 IU/mL   QuantiFERON Mitogen Value 7.02 IU/mL   QuantiFERON-TB Gold Plus Negative Negative      Assessment & Plan:   Problem List Items Addressed This Visit      Other   Lung mass - Primary    Discussed results with patient today. Will get him into cancer center ASAP- referral has already been generated. Working on trying  to get him an appointment. Will get him into see pulmonology for evaluation ASAP. Referral to them generated today. Questions answered today, but patient is anxious and would like to see specialists ASAP.      Relevant Orders   Ambulatory referral to Pulmonology       Follow up plan: Return Pending appointments with the specialists.

## 2017-11-08 NOTE — Patient Instructions (Signed)
Appointment with Dr. Ashby Dawes at [facility] in [facility] Wednesday 11/10/17 at 11:00. Please arrive by 10:45 to check in. Their address is [address], [address]. Phone number- [phone].

## 2017-11-09 ENCOUNTER — Inpatient Hospital Stay: Payer: BC Managed Care – PPO

## 2017-11-09 ENCOUNTER — Inpatient Hospital Stay: Payer: BC Managed Care – PPO | Attending: Internal Medicine | Admitting: Internal Medicine

## 2017-11-09 ENCOUNTER — Other Ambulatory Visit: Payer: Self-pay

## 2017-11-09 ENCOUNTER — Encounter: Payer: Self-pay | Admitting: Internal Medicine

## 2017-11-09 VITALS — BP 143/90 | HR 75 | Temp 97.3°F | Resp 16 | Wt 140.7 lb

## 2017-11-09 DIAGNOSIS — R918 Other nonspecific abnormal finding of lung field: Secondary | ICD-10-CM

## 2017-11-09 DIAGNOSIS — J449 Chronic obstructive pulmonary disease, unspecified: Secondary | ICD-10-CM | POA: Diagnosis not present

## 2017-11-09 DIAGNOSIS — R519 Headache, unspecified: Secondary | ICD-10-CM

## 2017-11-09 DIAGNOSIS — R51 Headache: Secondary | ICD-10-CM | POA: Insufficient documentation

## 2017-11-09 DIAGNOSIS — K769 Liver disease, unspecified: Secondary | ICD-10-CM | POA: Insufficient documentation

## 2017-11-09 LAB — CBC WITH DIFFERENTIAL/PLATELET
BASOS ABS: 0 10*3/uL (ref 0–0.1)
BASOS PCT: 1 %
EOS PCT: 4 %
Eosinophils Absolute: 0.2 10*3/uL (ref 0–0.7)
HEMATOCRIT: 40.9 % (ref 40.0–52.0)
Hemoglobin: 14 g/dL (ref 13.0–18.0)
Lymphocytes Relative: 20 %
Lymphs Abs: 1.2 10*3/uL (ref 1.0–3.6)
MCH: 33.4 pg (ref 26.0–34.0)
MCHC: 34.3 g/dL (ref 32.0–36.0)
MCV: 97.4 fL (ref 80.0–100.0)
MONO ABS: 0.6 10*3/uL (ref 0.2–1.0)
MONOS PCT: 10 %
NEUTROS ABS: 3.9 10*3/uL (ref 1.4–6.5)
Neutrophils Relative %: 65 %
PLATELETS: 228 10*3/uL (ref 150–440)
RBC: 4.19 MIL/uL — ABNORMAL LOW (ref 4.40–5.90)
RDW: 13.5 % (ref 11.5–14.5)
WBC: 5.9 10*3/uL (ref 3.8–10.6)

## 2017-11-09 LAB — COMPREHENSIVE METABOLIC PANEL
ALBUMIN: 3.9 g/dL (ref 3.5–5.0)
ALT: 17 U/L (ref 0–44)
AST: 27 U/L (ref 15–41)
Alkaline Phosphatase: 63 U/L (ref 38–126)
Anion gap: 11 (ref 5–15)
BILIRUBIN TOTAL: 0.5 mg/dL (ref 0.3–1.2)
BUN: 17 mg/dL (ref 8–23)
CALCIUM: 9.4 mg/dL (ref 8.9–10.3)
CO2: 26 mmol/L (ref 22–32)
CREATININE: 0.99 mg/dL (ref 0.61–1.24)
Chloride: 102 mmol/L (ref 98–111)
GFR calc Af Amer: >60 mL/min (ref >60)
GLUCOSE: 115 mg/dL — AB (ref 70–99)
POTASSIUM: 5 mmol/L (ref 3.5–5.1)
Sodium: 139 mmol/L (ref 135–145)
TOTAL PROTEIN: 7.2 g/dL (ref 6.5–8.1)

## 2017-11-09 LAB — LACTATE DEHYDROGENASE: LDH: 141 U/L (ref 98–192)

## 2017-11-09 NOTE — Patient Instructions (Signed)
#  Stop aspirin/no NSAIDs-in anticipation of biopsy-we will call you regarding the biopsy planning after I speak to the radiologist.

## 2017-11-09 NOTE — Progress Notes (Signed)
[facility] CONSULT NOTE  Patient Care Team: Valerie Roys, DO as PCP - General (Family Medicine)  CHIEF COMPLAINTS/PURPOSE OF CONSULTATION:  Lung mass  #Right upper lobe lung mass approximately 3 x 4 cm; multiple right upper lobe lung nodules up to 1 to 2 cm in size; right lobe of liver 2.5 cm lesion;   #History of smoking quit/Hx of spontaneous pneumothorax.   No history exists.    HISTORY OF PRESENTING ILLNESS:  James [name] 62 y.o.  male with prior history of smoking quit few years ago has been referred to [place] for further evaluation recommendations for ongoing cough.  Patient states that he had a hip replacement surgery November 2018.  He states that he was slow to recover from the surgery.  However in February 2019 patient noted to have multiple episodes of cough and phlegm-which is treated with antibiotics/prednisone.  Symptoms did not resolve completely; which led to a chest x-ray last week; and then a CT scan because of abnormal findings on the chest x-ray.  Apart from the cough patient does not have any other symptoms of chest pain.  Denies any worsening shortness of breath.  Does have symptoms of intermittent headache. Review of Systems  Constitutional: Positive for weight loss (20 pounds/ 2 years). Negative for chills, diaphoresis, fever and malaise/fatigue.  HENT: Negative for nosebleeds and sore throat.   Eyes: Positive for blurred vision. Negative for double vision.  Respiratory: Positive for cough, sputum production and shortness of breath. Negative for hemoptysis and wheezing.   Cardiovascular: Negative for chest pain, palpitations, orthopnea and leg swelling.  Gastrointestinal: Negative for abdominal pain, blood in stool, constipation, diarrhea, heartburn, melena, nausea and vomiting.  Genitourinary: Negative for dysuria, frequency and urgency.  Musculoskeletal: Negative for back pain and joint pain.  Skin: Negative.  Negative for itching and rash.   Neurological: Positive for headaches. Negative for dizziness, tingling, focal weakness and weakness.  Endo/Heme/Allergies: Does not bruise/bleed easily.  Psychiatric/Behavioral: Negative for depression. The patient is not nervous/anxious and does not have insomnia.      MEDICAL HISTORY:  Past Medical History:  Diagnosis Date  . Prepatellar bursitis of left knee     SURGICAL HISTORY: Past Surgical History:  Procedure Laterality Date  . collapsed lung  2012   "had to reinflate" " i carried large piece of sheet rock up stairs by myself "   . [place]ar   x2 , second was with mesh   . TOTAL HIP ARTHROPLASTY Right 04/09/2017   Procedure: RIGHT TOTAL HIP ARTHROPLASTY ANTERIOR APPROACH;  Surgeon: Mcarthur Rossetti, MD;  Location: [facility];  Service: Orthopedics;  Laterality: Right;  . URETHRAL STRICTURE DILATATION      SOCIAL HISTORY: works in Architect; lives in [place]; quit smoking- 2012 [spont pneumothorax]; started 57s- socially; light drinker.  Social History   Socioeconomic History  . Marital status: Married    Spouse name: Not on file  . Number of children: Not on file  . Years of education: Not on file  . Highest education level: Not on file  Occupational History  . Not on file  Social Needs  . Financial resource strain: Not on file  . Food insecurity:    Worry: Not on file    Inability: Not on file  . Transportation needs:    Medical: Not on file    Non-medical: Not on file  Tobacco Use  . Smoking status: Former Smoker    Types:  Cigarettes    Last attempt to quit: 03/18/2012    Years since quitting: 5.6  . Smokeless tobacco: Never Used  . Tobacco comment: 5 years   Substance and Sexual Activity  . Alcohol use: Yes    Comment: Socially  . Drug use: Yes    Types: Marijuana  . Sexual activity: Not Currently  Lifestyle  . Physical activity:    Days per week: Not on file    Minutes per session: Not on file  . Stress: Not on file   Relationships  . Social connections:    Talks on phone: Not on file    Gets together: Not on file    Attends religious service: Not on file    Active member of club or organization: Not on file    Attends meetings of clubs or organizations: Not on file    Relationship status: Not on file  . Intimate partner violence:    Fear of current or ex partner: Not on file    Emotionally abused: Not on file    Physically abused: Not on file    Forced sexual activity: Not on file  Other Topics Concern  . Not on file  Social History Narrative  . Not on file    FAMILY HISTORY: mom-bleeding ulcer in stomach; unknown dad side of family.  History reviewed. No pertinent family history.  ALLERGIES:  has No Known Allergies.  MEDICATIONS:  Current Outpatient Medications  Medication Sig Dispense Refill  . aspirin EC 325 MG EC tablet Take 1 tablet (325 mg total) by mouth daily with breakfast. 30 tablet 0  . Multiple Vitamin (MULTIVITAMIN WITH MINERALS) TABS tablet Take 1 tablet by mouth daily.    Marland Kitchen tiotropium (SPIRIVA HANDIHALER) 18 MCG inhalation capsule Place 1 capsule (18 mcg total) into inhaler and inhale daily. 30 capsule 2   No current facility-administered medications for this visit.       Marland Kitchen  PHYSICAL EXAMINATION: ECOG PERFORMANCE STATUS: 1 - Symptomatic but completely ambulatory  Vitals:   11/09/17 1114  BP: (!) 143/90  Pulse: 75  Resp: 16  Temp: (!) 97.3 F (36.3 C)   Filed Weights   11/09/17 1105 11/09/17 1114  Weight: 140 lb 10.5 oz (63.8 kg) 140 lb 10.5 oz (63.8 kg)    Physical Exam  Constitutional: He is oriented to person, place, and time and well-developed, well-nourished, and in no distress.  HENT:  Head: Normocephalic and atraumatic.  Mouth/Throat: Oropharynx is clear and moist. No oropharyngeal exudate.  Eyes: Pupils are equal, round, and reactive to light.  Neck: Normal range of motion. Neck supple.  Cardiovascular: Normal rate and regular rhythm.   Pulmonary/Chest: No respiratory distress. He has no wheezes.  Abdominal: Soft. Bowel sounds are normal. He exhibits no distension and no mass. There is no tenderness. There is no rebound and no guarding.  Musculoskeletal: Normal range of motion. He exhibits no edema or tenderness.  Neurological: He is alert and oriented to person, place, and time.  Skin: Skin is warm.  Psychiatric: Affect normal.     LABORATORY DATA:  I have reviewed the data as listed Lab Results  Component Value Date   WBC 5.9 11/09/2017   HGB 14.0 11/09/2017   HCT 40.9 11/09/2017   MCV 97.4 11/09/2017   PLT 228 11/09/2017   Recent Labs    04/10/17 0603 08/24/17 1651 11/09/17 1222  NA 133* 138 139  K 3.9 5.0 5.0  CL 102 100 102  CO2 26 23  26  GLUCOSE 112* 85 115*  BUN 11 18 17   CREATININE 0.92 1.06 0.99  CALCIUM 8.4* 9.4 9.4  GFRNONAA >60 74 >60  GFRAA >60 86 >60  PROT  --  6.8 7.2  ALBUMIN  --  4.2 3.9  AST  --  18 27  ALT  --  12 17  ALKPHOS  --  57 63  BILITOT  --  0.2 0.5    RADIOGRAPHIC STUDIES: I have personally reviewed the radiological images as listed and agreed with the findings in the report. Dg Chest 2 View  Result Date: 11/01/2017 CLINICAL DATA:  Cough and shortness of breath.  Weight loss. EXAM: CHEST - 2 VIEW COMPARISON:  May 03, 2013 FINDINGS: There is scarring in the right upper lobe, not present previously. Within the right upper lobe, there is a somewhat irregular nodular opacity measuring 1.4 x 1.0 x 1.0 cm. No other nodular opacities are evident. There is no frank edema or consolidation. Heart size and pulmonary vascularity are normal. No adenopathy. No bone lesions. IMPRESSION: Nodular opacity posterior segment right upper lobe, slightly irregular. Advise chest CT, ideally with intravenous contrast, to further assess. Scarring right upper lobe. No edema or consolidation. No evident adenopathy by radiography. Heart size normal. These results will be called to the ordering  clinician or representative by the Radiologist Assistant, and communication documented in the PACS or zVision Dashboard. Electronically Signed   By: Lowella Grip III M.D.   On: 11/01/2017 08:23   Ct Chest Wo Contrast  Result Date: 11/05/2017 CLINICAL DATA:  Evaluate lung mass. EXAM: CT CHEST WITHOUT CONTRAST TECHNIQUE: Multidetector CT imaging of the chest was performed following the standard protocol without IV contrast. COMPARISON:  Chest radiograph 11/01/2017 FINDINGS: Cardiovascular: Normal heart size. No pericardial effusion. Aortic atherosclerosis. Calcification in the LAD and left circumflex coronary arteries noted. Mediastinum/Nodes: Normal appearance of the thyroid gland. The trachea appears patent and is midline. Normal appearance of the esophagus. Enlarged left hilar lymph node measures 1.8 cm, image 71/2. Subcarinal lymph node measures 1.6 cm, image 89/2. Lungs/Pleura: The hilar regions are suboptimally evaluated due to lack of IV contrast material. There is increased soft tissue involving the right upper lobe bronchi worrisome for tumor, image 68/5. The margins of which are difficult to define. I suspect this lesion measures approximately 4.0 by 3.0 cm, image 67/2. There are several nodules identified within the right upper lobe which appears solid. Index nodule measures 1 cm, image 41/3. More posteriorly there is a 2.1 by 1.4 cm nodule, image 41/3. Round solid nodule within the superior segment of right lower lobe measures 6 mm, image 65/3. Other scattered small solid nodules are identified in both lungs. There are also patchy areas of airspace consolidation and ground-glass attenuation which is nonspecific in may be inflammatory or infectious in etiology. Metastatic disease not excluded. Upper Abdomen: There is a low-attenuation mass within the posterior right lobe of liver measuring 2.5 cm. This is new when compared with 01/28/2016. Musculoskeletal: Well defined sclerotic lesion within the  upper thoracic spine measures 9 mm, image 100/7. This has a benign nonaggressive appearance. No aggressive lytic or sclerotic bone lesions identified. IMPRESSION: 1. There is asymmetric increased soft tissue surrounding the right upper lobe bronchi which is suspicious for bronchogenic carcinoma. Further evaluation with PET-CT and tissue sampling is recommended. 2. Scattered solid round nodules are identified within both lungs, which in the setting of bronchogenic carcinoma would be suspicious for metastatic disease. 3. Enlarged left paratracheal and  subcarinal lymph nodes, which in the setting of lung cancer would be suspicious for metastatic adenopathy. 4. Low-attenuation lesion within posterior right lobe of liver is new compared with 01/28/2016 and is suspicious for an area of metastatic disease. 5. Multifocal areas of ground-glass attenuation and irregular nodularity are noted throughout both lungs. Findings are nonspecific and may be inflammatory or infectious in etiology. Metastatic disease not excluded. 6. Aortic atherosclerosis with LAD and left circumflex coronary artery atherosclerotic calcifications. Aortic Atherosclerosis (ICD10-I70.0). 7. These results will be called to the ordering clinician or representative by the Radiologist Assistant, and communication documented in the PACS or zVision Dashboard. Electronically Signed   By: Kerby Moors M.D.   On: 11/05/2017 15:05    ASSESSMENT & PLAN:   Mass of upper lobe of right lung #Predominant right upper lobe lung mass; with contralateral nodules pleural-based; and also approximately 2.5 cm liver lesion [new].  The pattern noted on imaging is highly concerning for malignancy-primarily lung.  #Recommend biopsy of the liver as this seems to be most accessible.  Discussed with IR; patient is awaiting evaluation with pulmonary tomorrow.  #Intermittent headaches-in the context of the above imaging-recommend MRI of the brain to rule out any metastatic  lesions to the brain.  #COPD/fairly stable await pulmonary evaluation tomorrow.  #Check CBC CMP LDH CEA.  Plan PET scan based upon insurance approval.  # I reviewed the blood work- with the patient in detail; also reviewed the imaging independently [as summarized above]; and with the patient in detail.  Patient will be contacted by lung navigator.  We will also review the imaging at the tumor conference this week.  Patient to follow-up with me after the biopsy.  Thank you Dr.Johnston for allowing me to participate in the care of your pleasant patient. Please do not hesitate to contact me with questions or concerns in the interim.  Addendum: Imaging reviewed the tumor conference; recommend biopsy of the liver lesion.   All questions were answered. The patient knows to call the clinic with any problems, questions or concerns.  # 60 minutes face-to-face with the patient discussing the above plan of care; more than 50% of time spent on prognosis/ natural history; counseling and coordination.     Cammie Sickle, MD 11/14/2017 4:00 PM

## 2017-11-09 NOTE — Progress Notes (Signed)
[facility] Consultation      Assessment and Plan:  Lung mass with hilar/mediastinal lymphadenopathy. Liver lesion suspicious for metastatic disease. Severe bullous emphysema, with history of spontaneous pneumothorax of the right lung. Dyspnea on exertion, secondary to COPD/emphysema.  - Discussed CT findings with patient and wife, the lesion appears accessible by EBUS bronchoscopy.  However given his bullous emphysema and history of previous right spontaneous pneumothorax he would be at high risk of complications.  Would therefore prefer that we begin with biopsy of the liver lesion if this appears suspicious and is accessible via needle biopsy.  Will await results of upcoming PET scan and decide in conjunction with oncology and radiology. - If liver biopsy is negative or not possible, will plan for EBUS bronchoscopy. - We will start the patient on Spiriva for COPD.  Will check pulmonary function test. - We will send blood work for TB QuantiFERON and ACE level.  Meds ordered this encounter  Medications  . tiotropium (SPIRIVA HANDIHALER) 18 MCG inhalation capsule    Sig: Place 1 capsule (18 mcg total) into inhaler and inhale daily.    Dispense:  30 capsule    Refill:  2   Orders Placed This Encounter  Procedures  . Angiotensin converting enzyme  . QuantiFERON-TB Gold Plus  . Pulmonary Function Test [facility] Only    Return in about 3 months (around 02/10/2018).     Date: 11/10/2017  MRN# [id number] James [name] [birth year]   James [name] is a 62 y.o. old male seen in consultation for chief complaint of:    Chief Complaint  Patient presents with  . Consult    Referred by Dr. Rogue Bussing:   . Lung Lesion  . Cough    clear mucus since Dec-Jan    HPI:  He has been coughing for about the past 6 months, he saw his PCP, treated with claritin, prednisone, abx at various times which helped, but then the cough came back. Then went for a CXR which was abnormal and led  to a CT chest.  He feels that his breathing has been well over the past year, but developed the breathing issues after his  Right hip surgery last November.  He gets dyspnea if he runs his push mower too fast. He has never been on an inhaler. He had a spontaneous lung collapse 3  Years ago when carrying sheet rock, went to medical care 3 days later, then received a chest tube.  He has no pets home. No travel other than [place].  He quit smoking in 2012, vaped up until a few weeks ago. He smoked sporadically when he did.  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  He still coughs occasionally, but better than before.  He has lost about 20 pounds in past year.   **CT chest 11/05/2017>> apical bullous emphysema, bilateral upper lobe scarring/nodular changes, which are greatest in the right upper lobe; bibasilar infiltrates with areas of bronchiectasis.  There is left paratracheal lymphadenopathy, right hilar lymphadenopathy, and right infrahilar lymphadenopathy medial to the right bronchus intermedius **Absolute eosinophil count 08/24/2017>> 300   PMHX:   Past Medical History:  Diagnosis Date  . Prepatellar bursitis of left knee    Surgical Hx:  Past Surgical History:  Procedure Laterality Date  . collapsed lung  2012   "had to reinflate" " i carried large piece of sheet rock up stairs by myself "   . [place]   x2 , second  was with mesh   . TOTAL HIP ARTHROPLASTY Right 04/09/2017   Procedure: RIGHT TOTAL HIP ARTHROPLASTY ANTERIOR APPROACH;  Surgeon: Mcarthur Rossetti, MD;  Location: [facility];  Service: Orthopedics;  Laterality: Right;  . URETHRAL STRICTURE DILATATION     Family Hx:  History reviewed. No pertinent family history. Social Hx:   Social History   Tobacco Use  . Smoking status: Former Smoker    Types: Cigarettes    Last attempt to quit: 03/18/2012    Years since quitting: 5.6  . Smokeless tobacco: Never Used  . Tobacco comment: 5 years   Substance  Use Topics  . Alcohol use: Yes    Comment: Socially  . Drug use: Yes    Types: Marijuana   Medication:    Current Outpatient Medications:  .  aspirin EC 325 MG EC tablet, Take 1 tablet (325 mg total) by mouth daily with breakfast., Disp: 30 tablet, Rfl: 0 .  Multiple Vitamin (MULTIVITAMIN WITH MINERALS) TABS tablet, Take 1 tablet by mouth daily., Disp: , Rfl:    Allergies:  Patient has no known allergies.  Review of Systems: Gen:  Denies  fever, sweats, chills HEENT: Denies blurred vision, double vision. bleeds, sore throat Cvc:  No dizziness, chest pain. Resp:   Denies cough or sputum production, shortness of breath Gi: Denies swallowing difficulty, stomach pain. Gu:  Denies bladder incontinence, burning urine Ext:   No Joint pain, stiffness. Skin: No skin rash,  hives  Endoc:  No polyuria, polydipsia. Psych: No depression, insomnia. Other:  All other systems were reviewed with the patient and were negative other that what is mentioned in the HPI.   Physical Examination:   VS: BP 112/66 (BP Location: Left Arm, Cuff Size: Normal)   Pulse 82   Resp 16   Ht 5\' 8"  (1.727 m)   Wt 141 lb (64 kg)   SpO2 93%   BMI 21.44 kg/m   General Appearance: No distress  Neuro:without focal findings,  speech normal,  HEENT: PERRLA, EOM intact.   Pulmonary: normal breath sounds, No wheezing.  CardiovascularNormal S1,S2.  No m/r/g.   Abdomen: Benign, Soft, non-tender. Renal:  No costovertebral tenderness  GU:  No performed at this time. Endoc: No evident thyromegaly, no signs of acromegaly. Skin:   warm, no rashes, no ecchymosis  Extremities: normal, no cyanosis, clubbing.  Other findings:    LABORATORY PANEL:   CBC Recent Labs  Lab 11/09/17 1222  WBC 5.9  HGB 14.0  HCT 40.9  PLT 228   ------------------------------------------------------------------------------------------------------------------  Chemistries  Recent Labs  Lab 11/09/17 1222  NA 139  K 5.0  CL 102   CO2 26  GLUCOSE 115*  BUN 17  CREATININE 0.99  CALCIUM 9.4  AST 27  ALT 17  ALKPHOS 63  BILITOT 0.5   ------------------------------------------------------------------------------------------------------------------  Cardiac Enzymes No results for input(s): TROPONINI in the last 168 hours. ------------------------------------------------------------  RADIOLOGY:  No results found.     Thank  you for the consultation and for allowing [facility] to assist in the care of your patient. Our recommendations are noted above.  Please contact us if we can be of further service.   Marda Stalker, M.D., F.C.C.P.  Board Certified in Internal Medicine, Pulmonary Medicine, [place], and Sleep Medicine.  [facility] Office Number: [phone]   11/10/2017

## 2017-11-10 ENCOUNTER — Ambulatory Visit: Payer: BC Managed Care – PPO | Admitting: Internal Medicine

## 2017-11-10 ENCOUNTER — Encounter: Payer: Self-pay | Admitting: Internal Medicine

## 2017-11-10 ENCOUNTER — Encounter: Payer: Self-pay | Admitting: *Deleted

## 2017-11-10 ENCOUNTER — Other Ambulatory Visit
Admission: RE | Admit: 2017-11-10 | Discharge: 2017-11-10 | Disposition: A | Discharge status: Home / Self Care | Payer: BC Managed Care – PPO | Source: Ambulatory Visit | Attending: Internal Medicine | Admitting: Internal Medicine

## 2017-11-10 VITALS — BP 112/66 | HR 82 | Resp 16 | Ht 68.0 in | Wt 141.0 lb

## 2017-11-10 DIAGNOSIS — R918 Other nonspecific abnormal finding of lung field: Secondary | ICD-10-CM | POA: Insufficient documentation

## 2017-11-10 DIAGNOSIS — J441 Chronic obstructive pulmonary disease with (acute) exacerbation: Secondary | ICD-10-CM | POA: Diagnosis present

## 2017-11-10 LAB — CEA: CEA1: 147.7 ng/mL — AB (ref 0.0–4.7)

## 2017-11-10 MED ORDER — TIOTROPIUM BROMIDE MONOHYDRATE 18 MCG IN CAPS: 18.0000 ug | ORAL_CAPSULE | Freq: Every day | RESPIRATORY_TRACT | 2 refills | Status: DC

## 2017-11-10 NOTE — Addendum Note (Signed)
Addended by: Stephanie Coup on: 11/10/2017 11:29 AM   Modules accepted: Orders

## 2017-11-10 NOTE — Addendum Note (Signed)
Addended by: Santiago Bur on: 11/10/2017 11:59 AM   Modules accepted: Orders

## 2017-11-10 NOTE — Patient Instructions (Addendum)
Will start spiriva handihaler one puff once daily. Will give you a sample of spiriva respimat (different type) 2 puff once daily.  Will determine what type of biopsy would be recommended after your PET scan.

## 2017-11-10 NOTE — Progress Notes (Signed)
  Oncology Nurse Navigator Documentation  Navigator Location: [facility] (11/10/17 1100) Referral date to RadOnc/MedOnc: 11/05/17 (11/10/17 1100) )Navigator Encounter Type: Introductory phone call (11/10/17 1100)   Abnormal Finding Date: 11/01/17 (11/10/17 1100)                   Treatment Phase: Abnormal Scans (11/10/17 1100) Barriers/Navigation Needs: Coordination of Care (11/10/17 1100)   Interventions: Coordination of Care (11/10/17 1100)   Coordination of Care: Appts;Radiology (11/10/17 1100)        Acuity: Level 2 (11/10/17 1100)   Acuity Level 2: Initial guidance, education and coordination as needed;Educational needs;Assistance expediting appointments (11/10/17 1100)    spoke with patient to introduce to navigator services. All questions answered during phone call. Reviewed upcoming appts with patient. Pt informed that Dr. Rogue Bussing has spoken with radiology who agreed that liver biopsy would be best site to biopsy. Pt informed that once biopsy is scheduled that he would be notified but will most likely be scheduled after his PET scan on Friday. Pt had no further questions at this time. Instructed to call back if has any further questions or needs. Pt verbalized understanding.  Time Spent with Patient: 30 (11/10/17 1100)

## 2017-11-11 ENCOUNTER — Telehealth: Payer: Self-pay | Admitting: *Deleted

## 2017-11-11 LAB — ANGIOTENSIN CONVERTING ENZYME: ANGIOTENSIN-CONVERTING ENZYME: 44 U/L (ref 14–82)

## 2017-11-11 NOTE — Telephone Encounter (Signed)
Renee Rival sent put a note to schedulers and dr Quitman Livings NP to do a peer ti peer. Ill see if I have it and send it to you.

## 2017-11-11 NOTE — Telephone Encounter (Signed)
Liver biopsy has been approved by radiologist. James [name] will be schedule for 7/3. Arrival at 830 am. NPO after midnight. I spoke with patient's wife. She gave verbal understanding of the plan of care. She was also informed that pet scan has been post poned until approved by pt's insurance.

## 2017-11-12 ENCOUNTER — Ambulatory Visit: Payer: BC Managed Care – PPO

## 2017-11-14 NOTE — Assessment & Plan Note (Signed)
#  Predominant right upper lobe lung mass; with contralateral nodules pleural-based; and also approximately 2.5 cm liver lesion [new].  The pattern noted on imaging is highly concerning for malignancy-primarily lung.  #Recommend biopsy of the liver as this seems to be most accessible.  Discussed with IR; patient is awaiting evaluation with pulmonary tomorrow.  #Intermittent headaches-in the context of the above imaging-recommend MRI of the brain to rule out any metastatic lesions to the brain.  #COPD/fairly stable await pulmonary evaluation tomorrow.  #Check CBC CMP LDH CEA.  Plan PET scan based upon insurance approval.  # I reviewed the blood work- with the patient in detail; also reviewed the imaging independently [as summarized above]; and with the patient in detail.  Patient will be contacted by lung navigator.  We will also review the imaging at the tumor conference this week.  Patient to follow-up with me after the biopsy.  Thank you Dr.Johnston for allowing me to participate in the care of your pleasant patient. Please do not hesitate to contact me with questions or concerns in the interim.  Addendum: Imaging reviewed the tumor conference; recommend biopsy of the liver lesion.

## 2017-11-15 ENCOUNTER — Other Ambulatory Visit: Payer: Self-pay | Admitting: Radiology

## 2017-11-16 ENCOUNTER — Telehealth: Payer: Self-pay | Admitting: *Deleted

## 2017-11-16 NOTE — Telephone Encounter (Signed)
Wife contacted cancer center to see if pet scan has been approved. Per md, insurance will need final pathology before attempting peer to peer. md is waiting on bx results before r/s pet scan. Wife made aware.

## 2017-11-17 ENCOUNTER — Encounter: Payer: Self-pay | Admitting: Internal Medicine

## 2017-11-17 ENCOUNTER — Telehealth: Payer: Self-pay | Admitting: *Deleted

## 2017-11-17 ENCOUNTER — Ambulatory Visit
Admission: RE | Admit: 2017-11-17 | Discharge: 2017-11-17 | Disposition: A | Discharge status: Home / Self Care | Payer: BC Managed Care – PPO | Source: Ambulatory Visit | Attending: Internal Medicine | Admitting: Internal Medicine

## 2017-11-17 ENCOUNTER — Telehealth: Payer: Self-pay | Admitting: Internal Medicine

## 2017-11-17 DIAGNOSIS — R918 Other nonspecific abnormal finding of lung field: Secondary | ICD-10-CM | POA: Insufficient documentation

## 2017-11-17 DIAGNOSIS — C787 Secondary malignant neoplasm of liver and intrahepatic bile duct: Secondary | ICD-10-CM | POA: Insufficient documentation

## 2017-11-17 DIAGNOSIS — K769 Liver disease, unspecified: Secondary | ICD-10-CM

## 2017-11-17 DIAGNOSIS — C7931 Secondary malignant neoplasm of brain: Secondary | ICD-10-CM | POA: Diagnosis not present

## 2017-11-17 DIAGNOSIS — R519 Headache, unspecified: Secondary | ICD-10-CM

## 2017-11-17 DIAGNOSIS — R51 Headache: Secondary | ICD-10-CM

## 2017-11-17 LAB — CBC
HCT: 39.7 % — ABNORMAL LOW (ref 40.0–52.0)
HEMOGLOBIN: 13.7 g/dL (ref 13.0–18.0)
MCH: 33.9 pg (ref 26.0–34.0)
MCHC: 34.6 g/dL (ref 32.0–36.0)
MCV: 98 fL (ref 80.0–100.0)
PLATELETS: 270 10*3/uL (ref 150–440)
RBC: 4.06 MIL/uL — AB (ref 4.40–5.90)
RDW: 12.9 % (ref 11.5–14.5)
WBC: 6.1 10*3/uL (ref 3.8–10.6)

## 2017-11-17 LAB — PROTIME-INR
INR: 0.91
Prothrombin Time: 12.2 seconds (ref 11.4–15.2)

## 2017-11-17 MED ORDER — DEXAMETHASONE 4 MG PO TABS: 8.0000 mg | ORAL_TABLET | Freq: Three times a day (TID) | ORAL | 0 refills | Status: DC

## 2017-11-17 MED ORDER — MIDAZOLAM HCL 5 MG/5ML IJ SOLN
INTRAMUSCULAR | Status: AC | PRN
  Administered 2017-11-17: 1 mg via INTRAVENOUS
  Administered 2017-11-17: 0.5 mg via INTRAVENOUS

## 2017-11-17 MED ORDER — GADOBENATE DIMEGLUMINE 529 MG/ML IV SOLN
13.0000 mL | Freq: Once | INTRAVENOUS | Status: AC | PRN
  Administered 2017-11-17: 13 mL via INTRAVENOUS

## 2017-11-17 MED ORDER — SODIUM CHLORIDE 0.9 % IV SOLN
INTRAVENOUS | Status: DC
  Administered 2017-11-17: 09:00:00 via INTRAVENOUS

## 2017-11-17 MED ORDER — FENTANYL CITRATE (PF) 100 MCG/2ML IJ SOLN
INTRAMUSCULAR | Status: AC | PRN
  Administered 2017-11-17: 50 ug via INTRAVENOUS

## 2017-11-17 MED ORDER — HYDROCODONE-ACETAMINOPHEN 5-325 MG PO TABS: 1.0000 | ORAL_TABLET | ORAL | Status: DC | PRN

## 2017-11-17 MED ORDER — FENTANYL CITRATE (PF) 100 MCG/2ML IJ SOLN
INTRAMUSCULAR | Status: AC
  Filled 2017-11-17: qty 4

## 2017-11-17 MED ORDER — MIDAZOLAM HCL 5 MG/5ML IJ SOLN
INTRAMUSCULAR | Status: AC
  Filled 2017-11-17: qty 5

## 2017-11-17 NOTE — Progress Notes (Signed)
Pt. In MRI from approx. 1230 until 1315.  Tolerated well.

## 2017-11-17 NOTE — Telephone Encounter (Signed)
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 No second lesion identified.  These results will be called to the ordering clinician or representative by the Radiologist Assistant, and communication documented in the PACS or zVision Dashboard.   Electronically Signed   By: Nelson Chimes M.D.   On: 11/17/2017 13:42

## 2017-11-17 NOTE — Telephone Encounter (Signed)
MD spoke with patient.

## 2017-11-17 NOTE — Procedures (Signed)
  Procedure: [place] core liver lesion 18g x3 EBL:   minimal Complications:  none immediate  See full dictation in [facility].  Dillard Cannon MD Main # [phone] Pager  [phone]

## 2017-11-17 NOTE — Telephone Encounter (Signed)
Spoke to pt re: brain mets; dex 8 mg TID; will speak to Dr.Crystal. Re: RT. refral to Dr.Crystal.

## 2017-11-19 ENCOUNTER — Ambulatory Visit
Admission: RE | Admit: 2017-11-19 | Discharge: 2017-11-19 | Disposition: A | Discharge status: Home / Self Care | Payer: BC Managed Care – PPO | Source: Ambulatory Visit | Attending: Radiation Oncology | Admitting: Radiation Oncology

## 2017-11-19 ENCOUNTER — Institutional Professional Consult (permissible substitution): Payer: BC Managed Care – PPO | Admitting: *Deleted

## 2017-11-19 ENCOUNTER — Other Ambulatory Visit: Payer: Self-pay

## 2017-11-19 VITALS — BP 125/79 | HR 67 | Temp 97.0°F | Resp 18 | Wt 143.3 lb

## 2017-11-19 DIAGNOSIS — C3411 Malignant neoplasm of upper lobe, right bronchus or lung: Secondary | ICD-10-CM | POA: Diagnosis not present

## 2017-11-19 DIAGNOSIS — Z510 Encounter for antineoplastic radiation therapy: Secondary | ICD-10-CM | POA: Diagnosis not present

## 2017-11-19 DIAGNOSIS — C7931 Secondary malignant neoplasm of brain: Secondary | ICD-10-CM | POA: Insufficient documentation

## 2017-11-19 NOTE — Progress Notes (Signed)
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 Evaluate for palliative brain radiation therapy.     HISTORY OF PRESENTING ILLNESS:  James [name] is a 62 y.o. white male with a prior history of tobacco use who quit 6 years ago and who recently presented complaining of significant headaches of greater than one month duration.  He initially presented in June 2019 complaining of a persistent cough and shortness of breath.  A chest x-ray on 11/01/17 revealed a right upper lobe mass measuring 1.4 x 1.0 x 1.0 cm.  A CT of the chest on 11/05/17 revealed a soft tissue mass involving the right upper lobe bronchus that was concerning for tumor.  The mass measured 4.0 x 3.0 cm.  Several other nodules were present in the right upper lobe that were concerning for metastatic deposits.  These nodules measured up to 2.1 cm in greatest diameter.  Of significance, a mass was present in the posterior right lobe of the liver that measured 2.5 cm.  Due to his persistent headaches he underwent an MRI of the brain at [facility] on 11/17/17 that revealed a necrotic mass within the right occipital lobe measuring 2.6 x 2.3 x 1.9 cm with regional vasogenic edema.  There was no evidence of midline shift or multifocality.  He underwent ultrasound guided biopsy of the liver mass on 11/17/17 with pathology currently pending.  He has been started on Decadron with resolution of the headaches.  He was referred to radiation oncology today for evaluation and discussion regarding the role of radiation therapy in treating the presumed brain metastasis.  REVIEW OF SYMPTOMS: Apart from the cough patient does not have any other symptoms of chest pain.  Denies any worsening shortness of breath.  Does have symptoms of intermittent headache. Review of Systems  Constitutional: Positive for weight loss (20 pounds/ 2 years) Over the past year his  weight has decreased from 162 pounds to 143 pounds, mild fatigue. Negative for chills, diaphoresis, fever and malaise.  HENT: Negative for nosebleeds and sore throat.   Eyes: Positive for blurred vision. Negative for double vision.  Respiratory: Positive for cough, sputum production and shortness of breath. Negative for hemoptysis and wheezing.   Cardiovascular: Negative for chest pain, palpitations, orthopnea and leg swelling.  Gastrointestinal: Negative for abdominal pain, blood in stool, constipation, diarrhea, heartburn, melena, nausea and vomiting.  Genitourinary: Negative for dysuria, frequency and urgency.  Musculoskeletal: Negative for back pain and joint pain.  Skin: Negative.  Negative for itching and rash.  Neurological: Positive for headaches-right occipital just superior to his right ear. Negative for dizziness, tingling, focal weakness and weakness.  Endo/Heme/Allergies: Does not bruise/bleed easily.  Psychiatric/Behavioral: Negative for depression. The patient is not nervous/anxious and does not have insomnia.      MEDICAL HISTORY:      Past Medical History:  Diagnosis Date  . Prepatellar bursitis of left knee     SURGICAL HISTORY:      Past Surgical History:  Procedure Laterality Date  . collapsed lung  2012   "had to reinflate" " i carried large piece of sheet rock up stairs by myself "   . [place]   x2 , second was with mesh   . TOTAL HIP ARTHROPLASTY Right 04/09/2017   Procedure: RIGHT TOTAL HIP ARTHROPLASTY ANTERIOR APPROACH;  Surgeon: Mcarthur Rossetti, MD;  Location: [facility];  Service: Orthopedics;  Laterality: Right;  .  URETHRAL STRICTURE DILATATION      SOCIAL HISTORY: works in [employer]; lives in [place] wife; quit smoking- 2012 [spont pneumothorax]; started 44s- socially; light drinker.  Social History        Socioeconomic History  . Marital status: Married    Spouse name: Not on file  . Number of  children: None  . Years of education: Graduated high school  . Highest education level: High school graduate  Occupational History  . 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  Social Needs  . Financial resource strain: Not on file  . Food insecurity:    Worry: Not on file    Inability: Not on file  . Transportation needs:    Medical: Not on file    Non-medical: Not on file  Tobacco Use  . Smoking status: Former Smoker    Types: Cigarettes    Last attempt to quit: 03/18/2012    Years since quitting: 5.6  . Smokeless tobacco: Never Used  . Tobacco comment: smoked up to 1/2 pack per day for 15-20 years.   Substance and Sexual Activity  . Alcohol use: Yes    Comment: Socially  . Drug use: Yes    Types: Marijuana  . Sexual activity: Not Currently  Lifestyle  . Physical activity: yard work    Days per week: Not on file    Minutes per session: Not on file  . Stress: Not on file  Relationships  . Social connections:    Talks on phone: Not on file    Gets together: Not on file    Attends religious service: Not on file    Active member of club or organization: Not on file    Attends meetings of clubs or organizations: Not on file    Relationship status: Not on file  . Intimate partner violence:    Fear of current or ex partner: Not on file    Emotionally abused: Not on file    Physically abused: Not on file    Forced sexual activity: Not on file  Other Topics Concern  . Not on file  Social History Narrative  . Not on file    FAMILY HISTORY: mom-bleeding ulcer in stomach; unknown dad side of family.  History reviewed. No pertinent family history.  ALLERGIES:  No Known Drug Allergies.  MEDICATIONS:        Current Outpatient Medications  Medication Sig Dispense Refill  . aspirin EC 325 MG EC tablet Take 1 tablet (325 mg total) by mouth daily with breakfast. 30 tablet 0  . Multiple Vitamin (MULTIVITAMIN WITH MINERALS)  TABS tablet Take 1 tablet by mouth daily.    Marland Kitchen tiotropium (SPIRIVA HANDIHALER) 18 MCG inhalation capsule Place 1 capsule (18 mcg total) into inhaler and inhale daily. 30 capsule 2   No current facility-administered medications for this visit.       Marland Kitchen  PHYSICAL EXAMINATION: ECOG PERFORMANCE STATUS: 1 - Symptomatic but completely ambulatory     Vitals:   11/19/2017  BP: 135/79  Pulse: 67  Resp: 18  Temp: 97.3       Filed Weights   11/09/17 1105 11/09/17 1114  Weight: 140 lb 10.5 oz (63.8 kg) 140 lb 10.5 oz (63.8 kg)    Physical Exam  Constitutional: He is oriented to person, place, and time and well-developed, well-nourished, and in no distress. He is accompanied today by his very concerned and loving wife as well as his brother-in-law from [place], [place].  HENT: PERRLA, EOMI Head: Normocephalic and atraumatic.  Mouth/Throat: Oropharynx is clear and moist. No oropharyngeal exudate.  Eyes: Pupils are equal, round, and reactive to light.  Neck: Normal range of motion. Neck supple. Nodes: There is no palpable supraclavicular, infraclavicular, jugular or axillary lymphadenopathy.  Cardiovascular: Normal rate and regular rhythm.  Pulmonary/Chest: No respiratory distress. He has no wheezes. Clear.  No rhonchi. Abdominal: Soft. Bowel sounds are normal. He exhibits no distension and no mass. There is no tenderness. There is no rebound and no guarding. Palpation over the liver reveals no tenderness.   Musculoskeletal: Normal range of motion. He exhibits no edema or tenderness. Vigorous fist percussion over the entire back and bony pelvis does not produce any tenderness.   Neurological: He is alert and oriented to person, place, and time. Romberg is not present.  Cranial nerve II-XII are intact.  Gait is stable.  Cerebellar exam is intact as tested by rapid alternating movements and finger to nose.   Skin: Skin is warm.  Psychiatric: Affect normal.      RADIOGRAPHIC STUDIES: I have personally reviewed the radiological images as listed and agreed with the findings in the report.  ImagingResults  Dg Chest 2 View  Result Date: 11/01/2017 CLINICAL DATA:  Cough and shortness of breath.  Weight loss. EXAM: CHEST - 2 VIEW COMPARISON:  May 03, 2013 FINDINGS: There is scarring in the right upper lobe, not present previously. Within the right upper lobe, there is a somewhat irregular nodular opacity measuring 1.4 x 1.0 x 1.0 cm. No other nodular opacities are evident. There is no frank edema or consolidation. Heart size and pulmonary vascularity are normal. No adenopathy. No bone lesions. IMPRESSION: Nodular opacity posterior segment right upper lobe, slightly irregular. Advise chest CT, ideally with intravenous contrast, to further assess. Scarring right upper lobe. No edema or consolidation. No evident adenopathy by radiography. Heart size normal. These results will be called to the ordering clinician or representative by the Radiologist Assistant, and communication documented in the PACS or zVision Dashboard. Electronically Signed   By: Lowella Grip III M.D.   On: 11/01/2017 08:23   Ct Chest Wo Contrast  Result Date: 11/05/2017 CLINICAL DATA:  Evaluate lung mass. EXAM: CT CHEST WITHOUT CONTRAST TECHNIQUE: Multidetector CT imaging of the chest was performed following the standard protocol without IV contrast. COMPARISON:  Chest radiograph 11/01/2017 FINDINGS: Cardiovascular: Normal heart size. No pericardial effusion. Aortic atherosclerosis. Calcification in the LAD and left circumflex coronary arteries noted. Mediastinum/Nodes: Normal appearance of the thyroid gland. The trachea appears patent and is midline. Normal appearance of the esophagus. Enlarged left hilar lymph node measures 1.8 cm, image 71/2. Subcarinal lymph node measures 1.6 cm, image 89/2. Lungs/Pleura: The hilar regions are suboptimally evaluated due to lack of IV contrast  material. There is increased soft tissue involving the right upper lobe bronchi worrisome for tumor, image 68/5. The margins of which are difficult to define. I suspect this lesion measures approximately 4.0 by 3.0 cm, image 67/2. There are several nodules identified within the right upper lobe which appears solid. Index nodule measures 1 cm, image 41/3. More posteriorly there is a 2.1 by 1.4 cm nodule, image 41/3. Round solid nodule within the superior segment of right lower lobe measures 6 mm, image 65/3. Other scattered small solid nodules are identified in both lungs. There are also patchy areas of airspace consolidation and ground-glass attenuation which is nonspecific in may be inflammatory or infectious in etiology. Metastatic disease not excluded. Upper Abdomen:  There is a low-attenuation mass within the posterior right lobe of liver measuring 2.5 cm. This is new when compared with 01/28/2016. Musculoskeletal: Well defined sclerotic lesion within the upper thoracic spine measures 9 mm, image 100/7. This has a benign nonaggressive appearance. No aggressive lytic or sclerotic bone lesions identified. IMPRESSION: 1. There is asymmetric increased soft tissue surrounding the right upper lobe bronchi which is suspicious for bronchogenic carcinoma. Further evaluation with PET-CT and tissue sampling is recommended. 2. Scattered solid round nodules are identified within both lungs, which in the setting of bronchogenic carcinoma would be suspicious for metastatic disease. 3. Enlarged left paratracheal and subcarinal lymph nodes, which in the setting of lung cancer would be suspicious for metastatic adenopathy. 4. Low-attenuation lesion within posterior right lobe of liver is new compared with 01/28/2016 and is suspicious for an area of metastatic disease. 5. Multifocal areas of ground-glass attenuation and irregular nodularity are noted throughout both lungs. Findings are nonspecific and may be inflammatory or  infectious in etiology. Metastatic disease not excluded. 6. Aortic atherosclerosis with LAD and left circumflex coronary artery atherosclerotic calcifications. Aortic Atherosclerosis (ICD10-I70.0). 7. These results will be called to the ordering clinician or representative by the Radiologist Assistant, and communication documented in the PACS or zVision Dashboard. Electronically Signed   By: Kerby Moors M.D.   On: 11/05/2017 15:05    MRI Brain 11/17/17: FINDINGS: Brain: The brainstem and cerebellum are normal. There is a necrotic mass within the right occipital lobe measuring 2.6 x 2.3 x 1.9 cm consistent with a metastasis. There is regional vasogenic edema. No midline shift. No second mass is identified. Few punctate foci of T2 and FLAIR signal in the left parietal white matter consistent with small vessel infarctions.    ASSESSMENT & PLAN:   Mass of upper lobe of right lung with suspected liver and brain metastases #Predominant right upper lobe lung mass; with contralateral nodules pleural-based; and also approximately 2.5 cm liver lesion [new].  The pattern noted on imaging is highly concerning for malignancy-primarily lung.  #Pathology from the biopsy of the liver is currently pending.    #Intermittent headaches-MRI of the brain revealed a 2.6 x 2.3 x 1.9 cm necrotic, solitary mass in the right occipital lobe felt to represent a metastatic focus.  He will undergo CT simulation and initiate a palliative course of radiation therapy in the near future.  In the interim he will continue on Decadron.    Today I had an opportunity to spend one hour with Mr. Mccravy, his wife and brother-in-law during which time I reviewed with them the findings on the MRI of the brain,and the CT of the chest.  I told him that I highly suspected that he had a lung cancer that had metastasized to his brain and liver.  I informed him that in the setting of widely metastatic disease that his cancer was not  considered curable.  We discussed the meaning of palliative care and the role of palliative brain irradiation in this setting.  I reviewed with Mr. Czarnecki his diagnosis, prognosis and treatment options.  We discussed the rationale for palliative brain irradiation in this setting, the logistics of radiation therapy as well as the potential risks and complications, both in the acute and late setting.  Throughout my discussion with Mr. Lashley and his family members they were given an opportunity to ask questions that appeared to have been answered to their satisfaction.  As always, thank you very much for allowing me to participate  in the care of this most pleasant, yet unfortunate, gentleman.  # 60 minutes face-to-face with the patient discussing the above plan of care; more than 50% of time spent on prognosis/ natural history; counseling and coordination.    Maison Agrusa A. Claudie Leach, MD, MPH 11/19/2017

## 2017-11-22 ENCOUNTER — Telehealth: Payer: Self-pay | Admitting: Internal Medicine

## 2017-11-22 ENCOUNTER — Other Ambulatory Visit: Payer: Self-pay | Admitting: Internal Medicine

## 2017-11-22 ENCOUNTER — Other Ambulatory Visit: Payer: Self-pay | Admitting: *Deleted

## 2017-11-22 DIAGNOSIS — C7931 Secondary malignant neoplasm of brain: Secondary | ICD-10-CM | POA: Diagnosis not present

## 2017-11-22 LAB — SURGICAL PATHOLOGY

## 2017-11-22 NOTE — Telephone Encounter (Signed)
Hayley- Spoke to Dr.Rubinas; adeno ca of lung origin; please order Omniseq. Thx. GB

## 2017-11-22 NOTE — Telephone Encounter (Signed)
Omniseq order form filled out and given to Nira Conn to get signed. Once signed, Nira Conn will fax to pathology to send out.

## 2017-11-23 ENCOUNTER — Inpatient Hospital Stay: Payer: BC Managed Care – PPO | Attending: Internal Medicine | Admitting: Internal Medicine

## 2017-11-23 ENCOUNTER — Other Ambulatory Visit: Payer: Self-pay

## 2017-11-23 VITALS — BP 136/80 | HR 78 | Temp 97.6°F | Resp 20 | Ht 68.0 in | Wt 147.7 lb

## 2017-11-23 DIAGNOSIS — C7931 Secondary malignant neoplasm of brain: Secondary | ICD-10-CM | POA: Diagnosis not present

## 2017-11-23 DIAGNOSIS — C782 Secondary malignant neoplasm of pleura: Secondary | ICD-10-CM | POA: Diagnosis not present

## 2017-11-23 DIAGNOSIS — C7951 Secondary malignant neoplasm of bone: Secondary | ICD-10-CM | POA: Diagnosis not present

## 2017-11-23 DIAGNOSIS — C3411 Malignant neoplasm of upper lobe, right bronchus or lung: Secondary | ICD-10-CM | POA: Diagnosis not present

## 2017-11-23 DIAGNOSIS — C787 Secondary malignant neoplasm of liver and intrahepatic bile duct: Secondary | ICD-10-CM | POA: Diagnosis not present

## 2017-11-23 DIAGNOSIS — Z79899 Other long term (current) drug therapy: Secondary | ICD-10-CM

## 2017-11-23 DIAGNOSIS — Z87891 Personal history of nicotine dependence: Secondary | ICD-10-CM | POA: Insufficient documentation

## 2017-11-23 DIAGNOSIS — Z7982 Long term (current) use of aspirin: Secondary | ICD-10-CM | POA: Insufficient documentation

## 2017-11-23 NOTE — Progress Notes (Signed)
START ON PATHWAY REGIMEN - Non-Small Cell Lung     A cycle is every 21 days:     Atezolizumab      Bevacizumab      Paclitaxel      Carboplatin   **Always confirm dose/schedule in your pharmacy ordering system**  Patient Characteristics: Stage IV Metastatic, Nonsquamous, Initial Chemotherapy/Immunotherapy, PS = 0, 1, ALK Translocation Negative/Unknown and EGFR Mutation Negative/Non-Sensitizing/Unknown, PD-L1 Expression Positive 1-49% (TPS) / Negative / Not Tested / Awaiting Test Results  and Immunotherapy Candidate AJCC T Category: T3 Current Disease Status: Distant Metastases AJCC N Category: N2 AJCC M Category: M1c AJCC 8 Stage Grouping: IVB Histology: Nonsquamous Cell ROS1 Rearrangement Status: Awaiting Test Results T790M Mutation Status: Not Applicable - EGFR Mutation Negative/Unknown Other Mutations/Biomarkers: No Other Actionable Mutations NTRK Gene Fusion Status: Awaiting Test Results PD-L1 Expression Status: Awaiting Test Results Chemotherapy/Immunotherapy LOT: Initial Chemotherapy/Immunotherapy Molecular Targeted Therapy: Not Appropriate ALK Translocation Status: Awaiting Test Results EGFR Mutation Status: Awaiting Test Results BRAF V600E Mutation Status: Awaiting Test Results Performance Status: PS = 0, 1 Immunotherapy Candidate Status: Candidate for Immunotherapy Intent of Therapy: Non-Curative / Palliative Intent, Discussed with Patient

## 2017-11-23 NOTE — Telephone Encounter (Signed)
omniseq form Faxed to pathology

## 2017-11-23 NOTE — Assessment & Plan Note (Addendum)
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  Reviewed the pathology and staging with the patient and family in detail.  NGS is pending  #Had a long discussion the patient and wife regarding the incurable nature of the disease.  Treatments are palliative; not curative.  #Discussed in general it would recommend chemoimmunotherapy if-wild-type tumor on NGS.  However if available targets-would recommend targeted therapy with pills.  Discussed the potential side effects including but not limited to-increasing fatigue, nausea vomiting, diarrhea, hair loss, sores in the mouth, increase risk of infection and also neuropathy.   # I discussed the mechanism of action; The goal of therapy is palliative; and length of treatments are likely ongoing/based upon the results of the scans. Discussed the potential side effects of immunotherapy including but not limited to diarrhea; skin rash; elevated LFTs/endocrine abnormalities etc.  Reviewed the rationale for using Avastin. Discussed the potential side effects including but not limited to elevated blood pressure ; nephrotic syndrome wound healing problems.  #Given the liver metastases which the poor prognosis; and the fact the patient has good baseline health I would recommend aggressive/palliative therapy with carbotaxol plus Avastin plus tecentriq.  However will not start treatment unless evaluated again; again awaiting results of the NGS.  #Brain metastases-solitary occipital right/recommend  Dex 4mg  TID starting today; RT on 7/11.  #Patient to follow-up with me on the 24th; chemo ed. Discussed re: port; hold off for now. Will send off steroid pre-meds/ anti-emetics. Also offered second opinion if interested.   # 40 minutes face-to-face with the patient discussing the above plan of care; more than 50% of time spent on prognosis/ natural history; counseling and coordination.

## 2017-11-25 ENCOUNTER — Ambulatory Visit
Admission: RE | Admit: 2017-11-25 | Discharge: 2017-11-25 | Disposition: A | Discharge status: Home / Self Care | Payer: BC Managed Care – PPO | Source: Ambulatory Visit | Attending: Radiation Oncology | Admitting: Radiation Oncology

## 2017-11-25 DIAGNOSIS — C7931 Secondary malignant neoplasm of brain: Secondary | ICD-10-CM | POA: Diagnosis not present

## 2017-11-26 ENCOUNTER — Ambulatory Visit
Admission: RE | Admit: 2017-11-26 | Discharge: 2017-11-26 | Disposition: A | Discharge status: Home / Self Care | Payer: BC Managed Care – PPO | Source: Ambulatory Visit | Attending: Radiation Oncology | Admitting: Radiation Oncology

## 2017-11-26 DIAGNOSIS — C3411 Malignant neoplasm of upper lobe, right bronchus or lung: Secondary | ICD-10-CM

## 2017-11-26 DIAGNOSIS — C7931 Secondary malignant neoplasm of brain: Secondary | ICD-10-CM | POA: Diagnosis not present

## 2017-11-27 ENCOUNTER — Telehealth: Payer: Self-pay | Admitting: Internal Medicine

## 2017-11-27 ENCOUNTER — Encounter: Payer: Self-pay | Admitting: Internal Medicine

## 2017-11-27 DIAGNOSIS — C3411 Malignant neoplasm of upper lobe, right bronchus or lung: Secondary | ICD-10-CM

## 2017-11-27 MED ORDER — ONDANSETRON HCL 8 MG PO TABS: ORAL_TABLET | ORAL | 1 refills | Status: AC

## 2017-11-27 MED ORDER — PROCHLORPERAZINE MALEATE 10 MG PO TABS: 10.0000 mg | ORAL_TABLET | Freq: Four times a day (QID) | ORAL | 1 refills | Status: AC | PRN

## 2017-11-27 NOTE — Progress Notes (Signed)
[place] OFFICE PROGRESS NOTE  Patient Care Team: Valerie Roys, DO as PCP - General (Family Medicine)  Cancer Staging No matching staging information was found for the patient.   Oncology History   # July 2019- [facility] CA- RUL [s/p Liver Bx]-CT chest- RUL/ liver/ brain solitary lesion.    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  male pleasant patient above history of newly diagnosed lung cancer is here for follow-up/reviewed the plan of care imaging.  Patient is currently on steroids for his brain metastasis.  Headaches are improving.  Denies any vision changes.  Is currently starting radiation.  Admits that his cough is improved.  No hemoptysis.  Appetite is fair.   Review of Systems  Constitutional: Positive for malaise/fatigue and weight loss. Negative for chills, diaphoresis and fever.  HENT: Negative for nosebleeds and sore throat.   Eyes: Negative for double vision.  Respiratory: Positive for cough (Improved) and shortness of breath (Stable). Negative for hemoptysis, sputum production and wheezing.   Cardiovascular: Negative for chest pain, palpitations, orthopnea and leg swelling.  Gastrointestinal: Negative for abdominal pain, blood in stool, constipation, diarrhea, heartburn, melena, nausea and vomiting.  Genitourinary: Negative for dysuria, frequency and urgency.  Musculoskeletal: Negative for back pain and joint pain.  Skin: Negative.  Negative for itching and rash.  Neurological: Positive for headaches (Improved). Negative for dizziness, tingling, focal weakness and weakness.  Endo/Heme/Allergies: Does not bruise/bleed easily.  Psychiatric/Behavioral: Negative for depression. The patient is not nervous/anxious and does not have insomnia.       PAST MEDICAL HISTORY :  Past Medical History:  Diagnosis Date   . Prepatellar bursitis of left knee    patient denies    PAST SURGICAL HISTORY :   Past Surgical History:  Procedure Laterality Date  . collapsed lung  2012   "had to reinflate" " i carried large piece of sheet rock up stairs by myself "   . [place]   x2 , second was with mesh   . TOTAL HIP ARTHROPLASTY Right 04/09/2017   Procedure: RIGHT TOTAL HIP ARTHROPLASTY ANTERIOR APPROACH;  Surgeon: Mcarthur Rossetti, MD;  Location: [facility];  Service: Orthopedics;  Laterality: Right;  . URETHRAL STRICTURE DILATATION      FAMILY HISTORY :  No family history on file.  Father with hypertension.  SOCIAL HISTORY: Works in [employer].  Lives with his wife.  History of smoking currently quit.  No alcohol abuse. Social History   Tobacco Use  . Smoking status: Former Smoker    Types: Cigarettes    Last attempt to quit: 03/18/2012    Years since quitting: 5.6  . Smokeless tobacco: Never Used  . Tobacco comment: 5 years   Substance Use Topics  . Alcohol use: Yes    Comment: Socially  . Drug use: Yes    Types: Marijuana    Comment: 1 month ago     ALLERGIES:  has No Known Allergies.  MEDICATIONS:  Current Outpatient Medications  Medication Sig Dispense Refill  . aspirin EC 325 MG EC tablet Take 1 tablet (325 mg total) by mouth daily with breakfast. 30 tablet 0  . dexamethasone (DECADRON) 4 MG tablet Take 2 tablets (8 mg total) by mouth 3 (three) times daily. 60 tablet 0  . Multiple  Vitamin (MULTIVITAMIN WITH MINERALS) TABS tablet Take 1 tablet by mouth daily.    Marland Kitchen tiotropium (SPIRIVA HANDIHALER) 18 MCG inhalation capsule Place 1 capsule (18 mcg total) into inhaler and inhale daily. 30 capsule 2   No current facility-administered medications for this visit.     PHYSICAL EXAMINATION: ECOG PERFORMANCE STATUS: 1 - Symptomatic but completely ambulatory  BP 136/80   Pulse 78   Temp 97.6 F (36.4 C) (Tympanic)   Resp 20   Ht 5\' 8"  (1.727 m)   Wt 147 lb 11.3  oz (67 kg)   BMI 22.46 kg/m   Filed Weights   11/23/17 1322  Weight: 147 lb 11.3 oz (67 kg)    GENERAL: Well-nourished well-developed; Alert, no distress and comfortable.  Accompanied by his wife EYES: no pallor or icterus OROPHARYNX: no thrush or ulceration; NECK: supple; no lymph nodes felt. LYMPH:  no palpable lymphadenopathy in the axillary or inguinal regions LUNGS: Decreased breath sounds auscultation bilaterally. No wheeze or crackles HEART/CVS: regular rate & rhythm and no murmurs; No lower extremity edema ABDOMEN:abdomen soft, non-tender and normal bowel sounds. No hepatomegaly or splenomegaly.  Musculoskeletal:no cyanosis of digits and no clubbing  PSYCH: alert & oriented x 3 with fluent speech NEURO: no focal motor/sensory deficits SKIN:  no rashes or significant lesions    LABORATORY DATA:  I have reviewed the data as listed    Component Value Date/Time   NA 139 11/09/2017 1222   NA 138 08/24/2017 1651   NA 137 04/23/2013 1330   K 5.0 11/09/2017 1222   K 3.8 04/23/2013 1330   CL 102 11/09/2017 1222   CL 105 04/23/2013 1330   CO2 26 11/09/2017 1222   CO2 22 04/23/2013 1330   GLUCOSE 115 (H) 11/09/2017 1222   GLUCOSE 120 (H) 04/23/2013 1330   BUN 17 11/09/2017 1222   BUN 18 08/24/2017 1651   BUN 18 04/23/2013 1330   CREATININE 0.99 11/09/2017 1222   CREATININE 0.90 04/23/2013 1330   CALCIUM 9.4 11/09/2017 1222   CALCIUM 9.3 04/23/2013 1330   PROT 7.2 11/09/2017 1222   PROT 6.8 08/24/2017 1651   PROT 7.5 04/23/2013 1330   ALBUMIN 3.9 11/09/2017 1222   ALBUMIN 4.2 08/24/2017 1651   ALBUMIN 3.8 04/23/2013 1330   AST 27 11/09/2017 1222   AST 11 (L) 04/23/2013 1330   ALT 17 11/09/2017 1222   ALT 18 04/23/2013 1330   ALKPHOS 63 11/09/2017 1222   ALKPHOS 64 04/23/2013 1330   BILITOT 0.5 11/09/2017 1222   BILITOT 0.2 08/24/2017 1651   BILITOT 0.6 04/23/2013 1330   GFRNONAA >60 11/09/2017 1222   GFRNONAA >60 04/23/2013 1330   GFRAA >60 11/09/2017 1222    GFRAA >60 04/23/2013 1330    No results found for: SPEP, UPEP  Lab Results  Component Value Date   WBC 6.1 11/17/2017   NEUTROABS 3.9 11/09/2017   HGB 13.7 11/17/2017   HCT 39.7 (L) 11/17/2017   MCV 98.0 11/17/2017   PLT 270 11/17/2017      Chemistry      Component Value Date/Time   NA 139 11/09/2017 1222   NA 138 08/24/2017 1651   NA 137 04/23/2013 1330   K 5.0 11/09/2017 1222   K 3.8 04/23/2013 1330   CL 102 11/09/2017 1222   CL 105 04/23/2013 1330   CO2 26 11/09/2017 1222   CO2 22 04/23/2013 1330   BUN 17 11/09/2017 1222   BUN 18 08/24/2017 1651   BUN 18  04/23/2013 1330   CREATININE 0.99 11/09/2017 1222   CREATININE 0.90 04/23/2013 1330      Component Value Date/Time   CALCIUM 9.4 11/09/2017 1222   CALCIUM 9.3 04/23/2013 1330   ALKPHOS 63 11/09/2017 1222   ALKPHOS 64 04/23/2013 1330   AST 27 11/09/2017 1222   AST 11 (L) 04/23/2013 1330   ALT 17 11/09/2017 1222   ALT 18 04/23/2013 1330   BILITOT 0.5 11/09/2017 1222   BILITOT 0.2 08/24/2017 1651   BILITOT 0.6 04/23/2013 1330       RADIOGRAPHIC STUDIES: I have personally reviewed the radiological images as listed and agreed with the findings in the report. No results found.   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  Reviewed the pathology and staging with the patient and family in detail.  NGS is pending  #Had a long discussion the patient and wife regarding the incurable nature of the disease.  Treatments are palliative; not curative.  #Discussed in general it would recommend chemoimmunotherapy if-wild-type tumor on NGS.  However if available targets-would recommend targeted therapy with pills.  Discussed the potential side effects including but not limited to-increasing fatigue, nausea vomiting, diarrhea, hair loss, sores in the mouth, increase risk of infection and also neuropathy.    # I discussed the mechanism of action; The goal of therapy is palliative; and length of treatments are likely ongoing/based upon the results of the scans. Discussed the potential side effects of immunotherapy including but not limited to diarrhea; skin rash; elevated LFTs/endocrine abnormalities etc.  Reviewed the rationale for using Avastin. Discussed the potential side effects including but not limited to elevated blood pressure ; nephrotic syndrome wound healing problems.  #Given the liver metastases which the poor prognosis; and the fact the patient has good baseline health I would recommend aggressive/palliative therapy with carbotaxol plus Avastin plus tecentriq.  However will not start treatment unless evaluated again; again awaiting results of the NGS.  #Brain metastases-solitary occipital right/recommend  Dex 4mg  TID starting today; RT on 7/11.  #Patient to follow-up with me on the 24th; chemo ed. Discussed re: port; hold off for now. Will send off steroid pre-meds/ anti-emetics. Also offered second opinion if interested.   # 40 minutes face-to-face with the patient discussing the above plan of care; more than 50% of time spent on prognosis/ natural history; counseling and coordination.   Orders Placed This Encounter  Procedures  . Comprehensive metabolic panel    Standing Status:   Future    Standing Expiration Date:   11/24/2018  . CBC with Differential    Standing Status:   Future    Standing Expiration Date:   11/24/2018  . Urinalysis, Complete w Microscopic    Standing Status:   Future    Standing Expiration Date:   11/24/2018   All questions were answered. The patient knows to call the clinic with any problems, questions or concerns.      Cammie Sickle, MD 11/27/2017 2:47 PM

## 2017-11-27 NOTE — Telephone Encounter (Signed)
James [name]-please inform patient that we will still await for the NGS to decide on the treatment regimen; however would like to plan out:  # Carbotaxol Avastin Tecentriq next visit  # Recommend chemotherapy education   # I also sent prescriptions for antiemetics  # Check if he is interested in Mediport placement  # Also, inform that he will need CT a/P; and bone scan.   #Again reassured the patient/wife that we will not start treatment until he speak to him again; however we would like to proceed with planning and not wait until the last moment when he gets significantly symptomatic.   Thanks GB

## 2017-11-29 ENCOUNTER — Ambulatory Visit
Admission: RE | Admit: 2017-11-29 | Discharge: 2017-11-29 | Disposition: A | Discharge status: Home / Self Care | Payer: BC Managed Care – PPO | Source: Ambulatory Visit | Attending: Radiation Oncology | Admitting: Radiation Oncology

## 2017-11-29 DIAGNOSIS — C7931 Secondary malignant neoplasm of brain: Secondary | ICD-10-CM | POA: Diagnosis not present

## 2017-11-29 NOTE — Telephone Encounter (Signed)
Spoke with patient after radiation treatment. Pt is aware of CT a/p and bone scan scheduled on 7/17. Prep instructions given. Pt understands that we are still waiting for NGS results to be reported but will plan for combo chemo-immunotherapy treatments to tentatively start on 7/24 if NGS rsults are negative for mutations. Chemo edu scheduled for 7/22. Pt declined port placement at this time and wants to wait until after NGS results are available. Pt understands that if needs combo chemo-immunotherapy then will get by peripheral IV and then schedule port placement after first cycle. Pt verbalized understanding. Nothing further needed at this time.

## 2017-11-30 ENCOUNTER — Ambulatory Visit
Admission: RE | Admit: 2017-11-30 | Discharge: 2017-11-30 | Disposition: A | Discharge status: Home / Self Care | Payer: BC Managed Care – PPO | Source: Ambulatory Visit | Attending: Radiation Oncology | Admitting: Radiation Oncology

## 2017-11-30 DIAGNOSIS — C7931 Secondary malignant neoplasm of brain: Secondary | ICD-10-CM | POA: Diagnosis not present

## 2017-12-01 ENCOUNTER — Encounter
Admission: RE | Admit: 2017-12-01 | Discharge: 2017-12-01 | Disposition: A | Discharge status: Home / Self Care | Payer: BC Managed Care – PPO | Source: Ambulatory Visit | Attending: Internal Medicine | Admitting: Internal Medicine

## 2017-12-01 ENCOUNTER — Ambulatory Visit
Admission: RE | Admit: 2017-12-01 | Discharge: 2017-12-01 | Disposition: A | Discharge status: Home / Self Care | Payer: BC Managed Care – PPO | Source: Ambulatory Visit | Attending: Radiation Oncology | Admitting: Radiation Oncology

## 2017-12-01 ENCOUNTER — Inpatient Hospital Stay: Payer: BC Managed Care – PPO

## 2017-12-01 ENCOUNTER — Ambulatory Visit
Admission: RE | Admit: 2017-12-01 | Discharge: 2017-12-01 | Disposition: A | Discharge status: Home / Self Care | Payer: BC Managed Care – PPO | Source: Ambulatory Visit | Attending: Internal Medicine | Admitting: Internal Medicine

## 2017-12-01 DIAGNOSIS — I700 Atherosclerosis of aorta: Secondary | ICD-10-CM | POA: Diagnosis not present

## 2017-12-01 DIAGNOSIS — C801 Malignant (primary) neoplasm, unspecified: Secondary | ICD-10-CM

## 2017-12-01 DIAGNOSIS — C3411 Malignant neoplasm of upper lobe, right bronchus or lung: Secondary | ICD-10-CM | POA: Diagnosis not present

## 2017-12-01 DIAGNOSIS — C787 Secondary malignant neoplasm of liver and intrahepatic bile duct: Secondary | ICD-10-CM | POA: Insufficient documentation

## 2017-12-01 DIAGNOSIS — C7931 Secondary malignant neoplasm of brain: Secondary | ICD-10-CM

## 2017-12-01 HISTORY — DX: Malignant (primary) neoplasm, unspecified: C80.1

## 2017-12-01 LAB — CBC
HCT: 39 % — ABNORMAL LOW (ref 40.0–52.0)
Hemoglobin: 13.4 g/dL (ref 13.0–18.0)
MCH: 33.7 pg (ref 26.0–34.0)
MCHC: 34.4 g/dL (ref 32.0–36.0)
MCV: 98 fL (ref 80.0–100.0)
PLATELETS: 230 10*3/uL (ref 150–440)
RBC: 3.98 MIL/uL — ABNORMAL LOW (ref 4.40–5.90)
RDW: 13.5 % (ref 11.5–14.5)
WBC: 21 10*3/uL — AB (ref 3.8–10.6)

## 2017-12-01 MED ORDER — IOHEXOL 300 MG/ML  SOLN
100.0000 mL | Freq: Once | INTRAMUSCULAR | Status: AC | PRN
  Administered 2017-12-01: 100 mL via INTRAVENOUS

## 2017-12-01 MED ORDER — TECHNETIUM TC 99M MEDRONATE IV KIT
20.0000 | PACK | Freq: Once | INTRAVENOUS | Status: AC | PRN
  Administered 2017-12-01: 23.78 via INTRAVENOUS

## 2017-12-02 ENCOUNTER — Other Ambulatory Visit: Payer: Self-pay | Admitting: *Deleted

## 2017-12-02 ENCOUNTER — Ambulatory Visit: Payer: BC Managed Care – PPO

## 2017-12-02 MED ORDER — DEXAMETHASONE 4 MG PO TABS: 4.0000 mg | ORAL_TABLET | Freq: Two times a day (BID) | ORAL | 0 refills | Status: DC

## 2017-12-02 NOTE — Telephone Encounter (Signed)
-----   Message from Secundino Ginger sent at 12/02/2017  8:43 AM EDT ----- Contact: [phone] He needs a refill on dexamethazone. Dr Baruch Gouty is weening him off this but Dr B prescribed. He needs a refill. [facility].

## 2017-12-03 ENCOUNTER — Ambulatory Visit: Payer: BC Managed Care – PPO

## 2017-12-03 NOTE — Patient Instructions (Signed)
Atezolizumab injection What is this medicine? ATEZOLIZUMAB (a te zoe LIZ ue mab) is a monoclonal antibody. It is used to treat bladder cancer (urothelial cancer) and non-small cell lung cancer. This medicine may be used for other purposes; ask your health care provider or pharmacist if you have questions. COMMON BRAND NAME(S): Tecentriq What should I tell my health care provider before I take this medicine? They need to know if you have any of these conditions: -diabetes -immune system problems -infection -inflammatory bowel disease -liver disease -lung or breathing disease -lupus -nervous system problems like myasthenia gravis or Guillain-Barre syndrome -organ transplant -an unusual or allergic reaction to atezolizumab, other medicines, foods, dyes, or preservatives -pregnant or trying to get pregnant -breast-feeding How should I use this medicine? This medicine is for infusion into a vein. It is given by a health care professional in a hospital or clinic setting. A special MedGuide will be given to you before each treatment. Be sure to read this information carefully each time. Talk to your pediatrician regarding the use of this medicine in children. Special care may be needed. Overdosage: If you think you have taken too much of this medicine contact a poison control center or emergency room at once. NOTE: This medicine is only for you. Do not share this medicine with others. What if I miss a dose? It is important not to miss your dose. Call your doctor or health care professional if you are unable to keep an appointment. What may interact with this medicine? Interactions have not been studied. This list may not describe all possible interactions. Give your health care provider a list of all the medicines, herbs, non-prescription drugs, or dietary supplements you use. Also tell them if you smoke, drink alcohol, or use illegal drugs. Some items may interact with your medicine. What  should I watch for while using this medicine? Your condition will be monitored carefully while you are receiving this medicine. You may need blood work done while you are taking this medicine. Do not become pregnant while taking this medicine or for at least 5 months after stopping it. Women should inform their doctor if they wish to become pregnant or think they might be pregnant. There is a potential for serious side effects to an unborn child. Talk to your health care professional or pharmacist for more information. Do not breast-feed an infant while taking this medicine or for at least 5 months after the last dose. What side effects may I notice from receiving this medicine? Side effects that you should report to your doctor or health care professional as soon as possible: -allergic reactions like skin rash, itching or hives, swelling of the face, lips, or tongue -black, tarry stools -bloody or watery diarrhea -breathing problems -changes in vision -chest pain or chest tightness -chills -facial flushing -fever -headache -signs and symptoms of high blood sugar such as dizziness; dry mouth; dry skin; fruity breath; nausea; stomach pain; increased hunger or thirst; increased urination -signs and symptoms of liver injury like dark yellow or brown urine; general ill feeling or flu-like symptoms; light-colored stools; loss of appetite; nausea; right upper belly pain; unusually weak or tired; yellowing of the eyes or skin -stomach pain -trouble passing urine or change in the amount of urine Side effects that usually do not require medical attention (report to your doctor or health care professional if they continue or are bothersome): -cough -diarrhea -joint pain -muscle pain -muscle weakness -tiredness -weight loss This list may not describe all  possible side effects. Call your doctor for medical advice about side effects. You may report side effects to FDA at 1-800-FDA-1088. Where should  I keep my medicine? This drug is given in a hospital or clinic and will not be stored at home. NOTE: This sheet is a summary. It may not cover all possible information. If you have questions about this medicine, talk to your doctor, pharmacist, or health care provider.  2018 [employer] Standard (2015-06-05 17:54:14) Bevacizumab injection What is this medicine? BEVACIZUMAB (be va SIZ yoo mab) is a monoclonal antibody. It is used to treat many types of cancer. This medicine may be used for other purposes; ask your health care provider or pharmacist if you have questions. COMMON BRAND NAME(S): Avastin What should I tell my health care provider before I take this medicine? They need to know if you have any of these conditions: -diabetes -heart disease -high blood pressure -history of coughing up blood -prior anthracycline chemotherapy (e.g., doxorubicin, daunorubicin, epirubicin) -recent or ongoing radiation therapy -recent or planning to have surgery -stroke -an unusual or allergic reaction to bevacizumab, hamster proteins, mouse proteins, other medicines, foods, dyes, or preservatives -pregnant or trying to get pregnant -breast-feeding How should I use this medicine? This medicine is for infusion into a vein. It is given by a health care professional in a hospital or clinic setting. Talk to your pediatrician regarding the use of this medicine in children. Special care may be needed. Overdosage: If you think you have taken too much of this medicine contact a poison control center or emergency room at once. NOTE: This medicine is only for you. Do not share this medicine with others. What if I miss a dose? It is important not to miss your dose. Call your doctor or health care professional if you are unable to keep an appointment. What may interact with this medicine? Interactions are not expected. This list may not describe all possible interactions. Give your health care provider a  list of all the medicines, herbs, non-prescription drugs, or dietary supplements you use. Also tell them if you smoke, drink alcohol, or use illegal drugs. Some items may interact with your medicine. What should I watch for while using this medicine? Your condition will be monitored carefully while you are receiving this medicine. You will need important blood work and urine testing done while you are taking this medicine. This medicine may increase your risk to bruise or bleed. Call your doctor or health care professional if you notice any unusual bleeding. This medicine should be started at least 28 days following major surgery and the site of the surgery should be totally healed. Check with your doctor before scheduling dental work or surgery while you are receiving this treatment. Talk to your doctor if you have recently had surgery or if you have a wound that has not healed. Do not become pregnant while taking this medicine or for 6 months after stopping it. Women should inform their doctor if they wish to become pregnant or think they might be pregnant. There is a potential for serious side effects to an unborn child. Talk to your health care professional or pharmacist for more information. Do not breast-feed an infant while taking this medicine and for 6 months after the last dose. This medicine has caused ovarian failure in some women. This medicine may interfere with the ability to have a child. You should talk to your doctor or health care professional if you are concerned about your fertility. What  side effects may I notice from receiving this medicine? Side effects that you should report to your doctor or health care professional as soon as possible: -allergic reactions like skin rash, itching or hives, swelling of the face, lips, or tongue -chest pain or chest tightness -chills -coughing up blood -high fever -seizures -severe constipation -signs and symptoms of bleeding such as bloody or  black, tarry stools; red or dark-brown urine; spitting up blood or brown material that looks like coffee grounds; red spots on the skin; unusual bruising or bleeding from the eye, gums, or nose -signs and symptoms of a blood clot such as breathing problems; chest pain; severe, sudden headache; pain, swelling, warmth in the leg -signs and symptoms of a stroke like changes in vision; confusion; trouble speaking or understanding; severe headaches; sudden numbness or weakness of the face, arm or leg; trouble walking; dizziness; loss of balance or coordination -stomach pain -sweating -swelling of legs or ankles -vomiting -weight gain Side effects that usually do not require medical attention (report to your doctor or health care professional if they continue or are bothersome): -back pain -changes in taste -decreased appetite -dry skin -nausea -tiredness This list may not describe all possible side effects. Call your doctor for medical advice about side effects. You may report side effects to FDA at 1-800-FDA-1088. Where should I keep my medicine? This drug is given in a hospital or clinic and will not be stored at home. NOTE: This sheet is a summary. It may not cover all possible information. If you have questions about this medicine, talk to your doctor, pharmacist, or health care provider.  2018 [employer]/Gold Standard (2016-05-01 14:33:29) Paclitaxel injection What is this medicine? PACLITAXEL (PAK li TAX el) is a chemotherapy drug. It targets fast dividing cells, like cancer cells, and causes these cells to die. This medicine is used to treat ovarian cancer, breast cancer, and other cancers. This medicine may be used for other purposes; ask your health care provider or pharmacist if you have questions. COMMON BRAND NAME(S): Onxol, Taxol What should I tell my health care provider before I take this medicine? They need to know if you have any of these conditions: -blood disorders -irregular  heartbeat -infection (especially a virus infection such as chickenpox, cold sores, or herpes) -liver disease -previous or ongoing radiation therapy -an unusual or allergic reaction to paclitaxel, alcohol, polyoxyethylated castor oil, other chemotherapy agents, other medicines, foods, dyes, or preservatives -pregnant or trying to get pregnant -breast-feeding How should I use this medicine? This drug is given as an infusion into a vein. It is administered in a hospital or clinic by a specially trained health care professional. Talk to your pediatrician regarding the use of this medicine in children. Special care may be needed. Overdosage: If you think you have taken too much of this medicine contact a poison control center or emergency room at once. NOTE: This medicine is only for you. Do not share this medicine with others. What if I miss a dose? It is important not to miss your dose. Call your doctor or health care professional if you are unable to keep an appointment. What may interact with this medicine? Do not take this medicine with any of the following medications: -disulfiram -metronidazole This medicine may also interact with the following medications: -cyclosporine -diazepam -ketoconazole -medicines to increase blood counts like filgrastim, pegfilgrastim, sargramostim -other chemotherapy drugs like cisplatin, doxorubicin, epirubicin, etoposide, teniposide, vincristine -quinidine -testosterone -vaccines -verapamil Talk to your doctor or health care professional  before taking any of these medicines: -acetaminophen -aspirin -ibuprofen -ketoprofen -naproxen This list may not describe all possible interactions. Give your health care provider a list of all the medicines, herbs, non-prescription drugs, or dietary supplements you use. Also tell them if you smoke, drink alcohol, or use illegal drugs. Some items may interact with your medicine. What should I watch for while using  this medicine? Your condition will be monitored carefully while you are receiving this medicine. You will need important blood work done while you are taking this medicine. This medicine can cause serious allergic reactions. To reduce your risk you will need to take other medicine(s) before treatment with this medicine. If you experience allergic reactions like skin rash, itching or hives, swelling of the face, lips, or tongue, tell your doctor or health care professional right away. In some cases, you may be given additional medicines to help with side effects. Follow all directions for their use. This drug may make you feel generally unwell. This is not uncommon, as chemotherapy can affect healthy cells as well as cancer cells. Report any side effects. Continue your course of treatment even though you feel ill unless your doctor tells you to stop. Call your doctor or health care professional for advice if you get a fever, chills or sore throat, or other symptoms of a cold or flu. Do not treat yourself. This drug decreases your body's ability to fight infections. Try to avoid being around people who are sick. This medicine may increase your risk to bruise or bleed. Call your doctor or health care professional if you notice any unusual bleeding. Be careful brushing and flossing your teeth or using a toothpick because you may get an infection or bleed more easily. If you have any dental work done, tell your dentist you are receiving this medicine. Avoid taking products that contain aspirin, acetaminophen, ibuprofen, naproxen, or ketoprofen unless instructed by your doctor. These medicines may hide a fever. Do not become pregnant while taking this medicine. Women should inform their doctor if they wish to become pregnant or think they might be pregnant. There is a potential for serious side effects to an unborn child. Talk to your health care professional or pharmacist for more information. Do not breast-feed  an infant while taking this medicine. Men are advised not to father a child while receiving this medicine. This product may contain alcohol. Ask your pharmacist or healthcare provider if this medicine contains alcohol. Be sure to tell all healthcare providers you are taking this medicine. Certain medicines, like metronidazole and disulfiram, can cause an unpleasant reaction when taken with alcohol. The reaction includes flushing, headache, nausea, vomiting, sweating, and increased thirst. The reaction can last from 30 minutes to several hours. What side effects may I notice from receiving this medicine? Side effects that you should report to your doctor or health care professional as soon as possible: -allergic reactions like skin rash, itching or hives, swelling of the face, lips, or tongue -low blood counts - This drug may decrease the number of white blood cells, red blood cells and platelets. You may be at increased risk for infections and bleeding. -signs of infection - fever or chills, cough, sore throat, pain or difficulty passing urine -signs of decreased platelets or bleeding - bruising, pinpoint red spots on the skin, black, tarry stools, nosebleeds -signs of decreased red blood cells - unusually weak or tired, fainting spells, lightheadedness -breathing problems -chest pain -high or low blood pressure -mouth sores -nausea and  vomiting -pain, swelling, redness or irritation at the injection site -pain, tingling, numbness in the hands or feet -slow or irregular heartbeat -swelling of the ankle, feet, hands Side effects that usually do not require medical attention (report to your doctor or health care professional if they continue or are bothersome): -bone pain -complete hair loss including hair on your head, underarms, pubic hair, eyebrows, and eyelashes -changes in the color of fingernails -diarrhea -loosening of the fingernails -loss of appetite -muscle or joint pain -red flush  to skin -sweating This list may not describe all possible side effects. Call your doctor for medical advice about side effects. You may report side effects to FDA at 1-800-FDA-1088. Where should I keep my medicine? This drug is given in a hospital or clinic and will not be stored at home. NOTE: This sheet is a summary. It may not cover all possible information. If you have questions about this medicine, talk to your doctor, pharmacist, or health care provider.  2018 Else[employer]/Gold Standard (2015-03-05 19:58:00) Carboplatin injection What is this medicine? CARBOPLATIN (KAR boe pla tin) is a chemotherapy drug. It targets fast dividing cells, like cancer cells, and causes these cells to die. This medicine is used to treat ovarian cancer and many other cancers. This medicine may be used for other purposes; ask your health care provider or pharmacist if you have questions. COMMON BRAND NAME(S): Paraplatin What should I tell my health care provider before I take this medicine? They need to know if you have any of these conditions: -blood disorders -hearing problems -kidney disease -recent or ongoing radiation therapy -an unusual or allergic reaction to carboplatin, cisplatin, other chemotherapy, other medicines, foods, dyes, or preservatives -pregnant or trying to get pregnant -breast-feeding How should I use this medicine? This drug is usually given as an infusion into a vein. It is administered in a hospital or clinic by a specially trained health care professional. Talk to your pediatrician regarding the use of this medicine in children. Special care may be needed. Overdosage: If you think you have taken too much of this medicine contact a poison control center or emergency room at once. NOTE: This medicine is only for you. Do not share this medicine with others. What if I miss a dose? It is important not to miss a dose. Call your doctor or health care professional if you are unable to keep  an appointment. What may interact with this medicine? -medicines for seizures -medicines to increase blood counts like filgrastim, pegfilgrastim, sargramostim -some antibiotics like amikacin, gentamicin, neomycin, streptomycin, tobramycin -vaccines Talk to your doctor or health care professional before taking any of these medicines: -acetaminophen -aspirin -ibuprofen -ketoprofen -naproxen This list may not describe all possible interactions. Give your health care provider a list of all the medicines, herbs, non-prescription drugs, or dietary supplements you use. Also tell them if you smoke, drink alcohol, or use illegal drugs. Some items may interact with your medicine. What should I watch for while using this medicine? Your condition will be monitored carefully while you are receiving this medicine. You will need important blood work done while you are taking this medicine. This drug may make you feel generally unwell. This is not uncommon, as chemotherapy can affect healthy cells as well as cancer cells. Report any side effects. Continue your course of treatment even though you feel ill unless your doctor tells you to stop. In some cases, you may be given additional medicines to help with side effects. Follow all directions for  their use. Call your doctor or health care professional for advice if you get a fever, chills or sore throat, or other symptoms of a cold or flu. Do not treat yourself. This drug decreases your body's ability to fight infections. Try to avoid being around people who are sick. This medicine may increase your risk to bruise or bleed. Call your doctor or health care professional if you notice any unusual bleeding. Be careful brushing and flossing your teeth or using a toothpick because you may get an infection or bleed more easily. If you have any dental work done, tell your dentist you are receiving this medicine. Avoid taking products that contain aspirin, acetaminophen,  ibuprofen, naproxen, or ketoprofen unless instructed by your doctor. These medicines may hide a fever. Do not become pregnant while taking this medicine. Women should inform their doctor if they wish to become pregnant or think they might be pregnant. There is a potential for serious side effects to an unborn child. Talk to your health care professional or pharmacist for more information. Do not breast-feed an infant while taking this medicine. What side effects may I notice from receiving this medicine? Side effects that you should report to your doctor or health care professional as soon as possible: -allergic reactions like skin rash, itching or hives, swelling of the face, lips, or tongue -signs of infection - fever or chills, cough, sore throat, pain or difficulty passing urine -signs of decreased platelets or bleeding - bruising, pinpoint red spots on the skin, black, tarry stools, nosebleeds -signs of decreased red blood cells - unusually weak or tired, fainting spells, lightheadedness -breathing problems -changes in hearing -changes in vision -chest pain -high blood pressure -low blood counts - This drug may decrease the number of white blood cells, red blood cells and platelets. You may be at increased risk for infections and bleeding. -nausea and vomiting -pain, swelling, redness or irritation at the injection site -pain, tingling, numbness in the hands or feet -problems with balance, talking, walking -trouble passing urine or change in the amount of urine Side effects that usually do not require medical attention (report to your doctor or health care professional if they continue or are bothersome): -hair loss -loss of appetite -metallic taste in the mouth or changes in taste This list may not describe all possible side effects. Call your doctor for medical advice about side effects. You may report side effects to FDA at 1-800-FDA-1088. Where should I keep my medicine? This drug  is given in a hospital or clinic and will not be stored at home. NOTE: This sheet is a summary. It may not cover all possible information. If you have questions about this medicine, talk to your doctor, pharmacist, or health care provider.  2018 [employer] Standard (2007-08-09 14:38:05)

## 2017-12-06 ENCOUNTER — Inpatient Hospital Stay: Payer: BC Managed Care – PPO

## 2017-12-06 ENCOUNTER — Ambulatory Visit: Payer: BC Managed Care – PPO

## 2017-12-07 ENCOUNTER — Other Ambulatory Visit: Payer: Self-pay | Admitting: Internal Medicine

## 2017-12-07 ENCOUNTER — Ambulatory Visit: Payer: BC Managed Care – PPO

## 2017-12-07 NOTE — Progress Notes (Signed)
Will wait for omniseq before recommending therapy.

## 2017-12-08 ENCOUNTER — Ambulatory Visit: Payer: BC Managed Care – PPO

## 2017-12-08 ENCOUNTER — Encounter: Payer: Self-pay | Admitting: Internal Medicine

## 2017-12-08 ENCOUNTER — Inpatient Hospital Stay: Payer: BC Managed Care – PPO

## 2017-12-08 ENCOUNTER — Other Ambulatory Visit: Payer: Self-pay

## 2017-12-08 ENCOUNTER — Inpatient Hospital Stay (HOSPITAL_BASED_OUTPATIENT_CLINIC_OR_DEPARTMENT_OTHER): Payer: BC Managed Care – PPO | Admitting: Internal Medicine

## 2017-12-08 VITALS — BP 123/81 | HR 81 | Temp 97.9°F | Resp 22 | Ht 68.0 in | Wt 150.0 lb

## 2017-12-08 DIAGNOSIS — C7931 Secondary malignant neoplasm of brain: Secondary | ICD-10-CM | POA: Diagnosis not present

## 2017-12-08 DIAGNOSIS — C3411 Malignant neoplasm of upper lobe, right bronchus or lung: Secondary | ICD-10-CM

## 2017-12-08 DIAGNOSIS — C787 Secondary malignant neoplasm of liver and intrahepatic bile duct: Secondary | ICD-10-CM | POA: Diagnosis not present

## 2017-12-08 DIAGNOSIS — C782 Secondary malignant neoplasm of pleura: Secondary | ICD-10-CM

## 2017-12-08 DIAGNOSIS — Z79899 Other long term (current) drug therapy: Secondary | ICD-10-CM

## 2017-12-08 DIAGNOSIS — C7951 Secondary malignant neoplasm of bone: Secondary | ICD-10-CM

## 2017-12-08 LAB — URINALYSIS, COMPLETE (UACMP) WITH MICROSCOPIC
Bacteria, UA: NONE SEEN
Bilirubin Urine: NEGATIVE
GLUCOSE, UA: NEGATIVE mg/dL
Hgb urine dipstick: NEGATIVE
KETONES UR: NEGATIVE mg/dL
LEUKOCYTES UA: NEGATIVE
Nitrite: NEGATIVE
PROTEIN: NEGATIVE mg/dL
Specific Gravity, Urine: 1.011 (ref 1.005–1.030)
pH: 6 (ref 5.0–8.0)

## 2017-12-08 LAB — CBC WITH DIFFERENTIAL/PLATELET
BASOS ABS: 0 10*3/uL (ref 0–0.1)
BASOS PCT: 0 %
EOS ABS: 0.1 10*3/uL (ref 0–0.7)
Eosinophils Relative: 0 %
HEMATOCRIT: 40.2 % (ref 40.0–52.0)
Hemoglobin: 13.7 g/dL (ref 13.0–18.0)
Lymphocytes Relative: 5 %
Lymphs Abs: 0.7 10*3/uL — ABNORMAL LOW (ref 1.0–3.6)
MCH: 33.5 pg (ref 26.0–34.0)
MCHC: 34.1 g/dL (ref 32.0–36.0)
MCV: 98.4 fL (ref 80.0–100.0)
MONO ABS: 0.5 10*3/uL (ref 0.2–1.0)
Monocytes Relative: 4 %
NEUTROS ABS: 12.2 10*3/uL — AB (ref 1.4–6.5)
Neutrophils Relative %: 91 %
PLATELETS: 141 10*3/uL — AB (ref 150–440)
RBC: 4.09 MIL/uL — ABNORMAL LOW (ref 4.40–5.90)
RDW: 13.5 % (ref 11.5–14.5)
WBC: 13.5 10*3/uL — ABNORMAL HIGH (ref 3.8–10.6)

## 2017-12-08 LAB — COMPREHENSIVE METABOLIC PANEL
ALBUMIN: 3 g/dL — AB (ref 3.5–5.0)
ALT: 30 U/L (ref 0–44)
ANION GAP: 8 (ref 5–15)
AST: 23 U/L (ref 15–41)
Alkaline Phosphatase: 65 U/L (ref 38–126)
BUN: 31 mg/dL — AB (ref 8–23)
CALCIUM: 8.7 mg/dL — AB (ref 8.9–10.3)
CO2: 21 mmol/L — ABNORMAL LOW (ref 22–32)
CREATININE: 0.83 mg/dL (ref 0.61–1.24)
Chloride: 107 mmol/L (ref 98–111)
GFR calc Af Amer: >60 mL/min (ref >60)
GFR calc non Af Amer: >60 mL/min (ref >60)
GLUCOSE: 114 mg/dL — AB (ref 70–99)
Potassium: 4.5 mmol/L (ref 3.5–5.1)
SODIUM: 136 mmol/L (ref 135–145)
Total Bilirubin: 0.6 mg/dL (ref 0.3–1.2)
Total Protein: 5.6 g/dL — ABNORMAL LOW (ref 6.5–8.1)

## 2017-12-08 NOTE — Progress Notes (Signed)
[place] OFFICE PROGRESS NOTE  Patient Care Team: Valerie Roys, DO as PCP - General (Family Medicine) Telford Nab, RN as Registered Nurse  Cancer Staging No matching staging information was found for the patient.   Oncology History   # July 2019- [facility] CA- RUL [s/p Liver Bx]-CT chest- RUL/ liver/ brain solitary lesion.    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  male pleasant patient above history of metastatic adenocarcinoma the lung is here for follow-up/review the results of his imaging.  Patient's headaches are improved.  Patient is currently on dexamethasone 4 mg once a day; taper as per radiation oncology.  Finished radiation..  No nausea no vomiting.  No worsening cough.  Appetite fair.  No weight loss.  Review of Systems  Constitutional: Negative for chills, diaphoresis, fever, malaise/fatigue and weight loss.  HENT: Negative for nosebleeds and sore throat.   Eyes: Negative for double vision.  Respiratory: Negative for cough, hemoptysis, sputum production, shortness of breath and wheezing.   Cardiovascular: Negative for chest pain, palpitations, orthopnea and leg swelling.  Gastrointestinal: Negative for abdominal pain, blood in stool, constipation, diarrhea, heartburn, melena, nausea and vomiting.  Genitourinary: Negative for dysuria, frequency and urgency.  Musculoskeletal: Negative for back pain and joint pain.  Skin: Negative.  Negative for itching and rash.  Neurological: Negative for dizziness, tingling, focal weakness, weakness and headaches.  Endo/Heme/Allergies: Does not bruise/bleed easily.  Psychiatric/Behavioral:  Negative for depression. The patient is not nervous/anxious and does not have insomnia.       PAST MEDICAL HISTORY :  Past Medical History:  Diagnosis Date  . Cancer ([place]) 12/01/2017   Primary cancer of right upper lobe of lung. Mets to liver, brain.  Marland Kitchen Prepatellar bursitis of left knee    patient denies    PAST SURGICAL HISTORY :   Past Surgical History:  Procedure Laterality Date  . collapsed lung  2012   "had to reinflate" " i carried large piece of sheet rock up stairs by myself "   . [place]   x2 , second was with mesh   . TOTAL HIP ARTHROPLASTY Right 04/09/2017   Procedure: RIGHT TOTAL HIP ARTHROPLASTY ANTERIOR APPROACH;  Surgeon: Mcarthur Rossetti, MD;  Location: [facility];  Service: Orthopedics;  Laterality: Right;  . URETHRAL STRICTURE DILATATION      FAMILY HISTORY :  History reviewed. No pertinent family history.  SOCIAL HISTORY:   Social History   Tobacco Use  . Smoking status: Former Smoker    Types: Cigarettes    Last attempt to quit: 03/18/2012    Years since quitting: 5.7  . Smokeless tobacco: Never Used  . Tobacco comment: 5 years   Substance Use Topics  . Alcohol use: Yes    Comment: Socially  . Drug use: Yes    Types: Marijuana    Comment: 1 month ago     ALLERGIES:  has No Known Allergies.  MEDICATIONS:  Current Outpatient Medications  Medication Sig Dispense Refill  . aspirin EC 325 MG EC tablet Take 1 tablet (325 mg total) by mouth daily with breakfast. 30 tablet 0  . dexamethasone (DECADRON) 4  MG tablet Take 1 tablet (4 mg total) by mouth 2 (two) times daily. 60 tablet 0  . Multiple Vitamin (MULTIVITAMIN WITH MINERALS) TABS tablet Take 1 tablet by mouth daily.    . ondansetron (ZOFRAN) 8 MG tablet One pill every 8 hours as needed for nausea/vomitting. 40 tablet 1  . prochlorperazine (COMPAZINE) 10 MG tablet Take 1 tablet (10 mg total) by mouth every 6 (six) hours as needed for nausea or vomiting. 40 tablet 1  .  tiotropium (SPIRIVA HANDIHALER) 18 MCG inhalation capsule Place 1 capsule (18 mcg total) into inhaler and inhale daily. 30 capsule 2   No current facility-administered medications for this visit.     PHYSICAL EXAMINATION: ECOG PERFORMANCE STATUS: 0 - Asymptomatic  BP 123/81 (Patient Position: Sitting)   Pulse 81   Temp 97.9 F (36.6 C) (Tympanic)   Resp (!) 22   Ht '5\' 8"'$  (1.727 m)   Wt 150 lb (68 kg)   SpO2 97% Comment: RA  BMI 22.81 kg/m   Filed Weights   12/08/17 0839  Weight: 150 lb (68 kg)    GENERAL: Well-nourished well-developed; Alert, no distress and comfortable.  Accompanied by his wife. EYES: no pallor or icterus OROPHARYNX: no thrush or ulceration; NECK: supple; no lymph nodes felt. LYMPH:  no palpable lymphadenopathy in the axillary or inguinal regions LUNGS: Decreased breath sounds auscultation bilaterally. No wheeze or crackles HEART/CVS: regular rate & rhythm and no murmurs; No lower extremity edema ABDOMEN:abdomen soft, non-tender and normal bowel sounds. No hepatomegaly or splenomegaly.  Musculoskeletal:no cyanosis of digits and no clubbing  PSYCH: alert & oriented x 3 with fluent speech NEURO: no focal motor/sensory deficits SKIN:  no rashes or significant lesions    LABORATORY DATA:  I have reviewed the data as listed    Component Value Date/Time   NA 136 12/08/2017 0819   NA 138 08/24/2017 1651   NA 137 04/23/2013 1330   K 4.5 12/08/2017 0819   K 3.8 04/23/2013 1330   CL 107 12/08/2017 0819   CL 105 04/23/2013 1330   CO2 21 (L) 12/08/2017 0819   CO2 22 04/23/2013 1330   GLUCOSE 114 (H) 12/08/2017 0819   GLUCOSE 120 (H) 04/23/2013 1330   BUN 31 (H) 12/08/2017 0819   BUN 18 08/24/2017 1651   BUN 18 04/23/2013 1330   CREATININE 0.83 12/08/2017 0819   CREATININE 0.90 04/23/2013 1330   CALCIUM 8.7 (L) 12/08/2017 0819   CALCIUM 9.3 04/23/2013 1330   PROT 5.6 (L) 12/08/2017 0819   PROT 6.8 08/24/2017 1651   PROT 7.5 04/23/2013 1330    ALBUMIN 3.0 (L) 12/08/2017 0819   ALBUMIN 4.2 08/24/2017 1651   ALBUMIN 3.8 04/23/2013 1330   AST 23 12/08/2017 0819   AST 11 (L) 04/23/2013 1330   ALT 30 12/08/2017 0819   ALT 18 04/23/2013 1330   ALKPHOS 65 12/08/2017 0819   ALKPHOS 64 04/23/2013 1330   BILITOT 0.6 12/08/2017 0819   BILITOT 0.2 08/24/2017 1651   BILITOT 0.6 04/23/2013 1330   GFRNONAA >60 12/08/2017 0819   GFRNONAA >60 04/23/2013 1330   GFRAA >60 12/08/2017 0819   GFRAA >60 04/23/2013 1330    No results found for: SPEP, UPEP  Lab Results  Component Value Date   WBC 13.5 (H) 12/08/2017   NEUTROABS 12.2 (H) 12/08/2017   HGB 13.7 12/08/2017   HCT 40.2 12/08/2017   MCV 98.4 12/08/2017   PLT 141 (L) 12/08/2017      Chemistry  Component Value Date/Time   NA 136 12/08/2017 0819   NA 138 08/24/2017 1651   NA 137 04/23/2013 1330   K 4.5 12/08/2017 0819   K 3.8 04/23/2013 1330   CL 107 12/08/2017 0819   CL 105 04/23/2013 1330   CO2 21 (L) 12/08/2017 0819   CO2 22 04/23/2013 1330   BUN 31 (H) 12/08/2017 0819   BUN 18 08/24/2017 1651   BUN 18 04/23/2013 1330   CREATININE 0.83 12/08/2017 0819   CREATININE 0.90 04/23/2013 1330      Component Value Date/Time   CALCIUM 8.7 (L) 12/08/2017 0819   CALCIUM 9.3 04/23/2013 1330   ALKPHOS 65 12/08/2017 0819   ALKPHOS 64 04/23/2013 1330   AST 23 12/08/2017 0819   AST 11 (L) 04/23/2013 1330   ALT 30 12/08/2017 0819   ALT 18 04/23/2013 1330   BILITOT 0.6 12/08/2017 0819   BILITOT 0.2 08/24/2017 1651   BILITOT 0.6 04/23/2013 1330       RADIOGRAPHIC STUDIES: I have personally reviewed the radiological images as listed and agreed with the findings in the report. No results found.   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  Reviewed the imaging of the abdomen pelvis-that shows liver metastases at least 2; bone scan shows right humeral; left  fifth rib; L2 vertebral body metastases.  Stable  # Discussed the treatment options would basically depend upon his-molecular testing/NGS results.  We should be expecting his results in the next week or so.   #Again the treatment options would include chemotherapy/chemoimmunotherapy/targeted therapy-based on NGS results.  Again treatment would be palliative.  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  Improved.  #Follow-up to be decided on NGS testing/plan of care.  # I reviewed the blood work- with the patient in detail; also reviewed the imaging independently [as summarized above]; and with the patient in detail.    No orders of the defined types were placed in this encounter.  All questions were answered. The patient knows to call the clinic with any problems, questions or concerns.      Cammie Sickle, MD 12/10/2017 7:49 AM

## 2017-12-08 NOTE — Assessment & Plan Note (Addendum)
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  Reviewed the imaging of the abdomen pelvis-that shows liver metastases at least 2; bone scan shows right humeral; left fifth rib; L2 vertebral body metastases.  Stable  # Discussed the treatment options would basically depend upon his-molecular testing/NGS results.  We should be expecting his results in the next week or so.   #Again the treatment options would include chemotherapy/chemoimmunotherapy/targeted therapy-based on NGS results.  Again treatment would be palliative.  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  Improved.  #Follow-up to be decided on NGS testing/plan of care.  # I reviewed the blood work- with the patient in detail; also reviewed the imaging independently [as summarized above]; and with the patient in detail.

## 2017-12-09 ENCOUNTER — Encounter: Payer: Self-pay | Admitting: Internal Medicine

## 2017-12-10 ENCOUNTER — Inpatient Hospital Stay (HOSPITAL_BASED_OUTPATIENT_CLINIC_OR_DEPARTMENT_OTHER): Payer: BC Managed Care – PPO | Admitting: Internal Medicine

## 2017-12-10 ENCOUNTER — Telehealth: Payer: Self-pay | Admitting: Internal Medicine

## 2017-12-10 ENCOUNTER — Encounter: Payer: Self-pay | Admitting: *Deleted

## 2017-12-10 ENCOUNTER — Other Ambulatory Visit: Payer: Self-pay

## 2017-12-10 VITALS — BP 135/85 | HR 101 | Temp 97.6°F | Resp 20 | Ht 68.0 in | Wt 147.8 lb

## 2017-12-10 DIAGNOSIS — C3411 Malignant neoplasm of upper lobe, right bronchus or lung: Secondary | ICD-10-CM

## 2017-12-10 DIAGNOSIS — C782 Secondary malignant neoplasm of pleura: Secondary | ICD-10-CM

## 2017-12-10 DIAGNOSIS — Z79899 Other long term (current) drug therapy: Secondary | ICD-10-CM

## 2017-12-10 DIAGNOSIS — C7931 Secondary malignant neoplasm of brain: Secondary | ICD-10-CM | POA: Diagnosis not present

## 2017-12-10 DIAGNOSIS — Z7189 Other specified counseling: Secondary | ICD-10-CM | POA: Insufficient documentation

## 2017-12-10 DIAGNOSIS — C7951 Secondary malignant neoplasm of bone: Secondary | ICD-10-CM

## 2017-12-10 DIAGNOSIS — C787 Secondary malignant neoplasm of liver and intrahepatic bile duct: Secondary | ICD-10-CM | POA: Diagnosis not present

## 2017-12-10 NOTE — Telephone Encounter (Signed)
Spoke with patient and he agreed to see md today at 3:15pm. Pt added to schedule.

## 2017-12-10 NOTE — Assessment & Plan Note (Addendum)
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  Reviewed the imaging of the abdomen pelvis-that shows liver metastases at least 2; bone scan shows right humeral; left fifth rib; L2 vertebral body metastases.  Stable  #NGS negative for targetable mutation/PDL 1.  Given his liver metastases/brain mets/good performance status-I would recommend carbotaxol plus Avastin plus Tecentriq every 3 weeks x 4 cycles followed by maintenance.  # Discussed the potential side effects including but not limited to-increasing fatigue, nausea vomiting, diarrhea, hair loss, sores in the mouth, increase risk of infection and also neuropathy.    I discussed the mechanism of action; The goal of therapy is palliative; and length of treatments are likely ongoing/based upon the results of the scans. Discussed the potential side effects of immunotherapy including but not limited to diarrhea; skin rash; elevated LFTs/endocrine abnormalities etc.  Reviewed the rationale for using Avastin. Discussed the potential side effects including but not limited to elevated blood pressure ; nephrotic syndrome wound healing problems.  #Discussed that I would recommend getting a CT scan after 2 cycles for restaging; and subsequent scans will be done for surveillance.   #Patient understands treatments are palliative not curative.  Discussed that median life expectancy is anywhere between 1 to 2 years.  #Bone mets-patient will also need Delton See; will discuss further.  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  Improved.  #Plan chemotherapy 7/31; declines port placement at this time.  No labs.;  Postchemotherapy can do labs; possible fluids.  Follow-up with me in 3 weeks/cycle #2.  Labs UA.  # 25 minutes face-to-face with the patient discussing the above plan of care; more than 50% of time spent on prognosis/ natural history; counseling and coordination.

## 2017-12-10 NOTE — Progress Notes (Signed)
[place] OFFICE PROGRESS NOTE  Patient Care Team: Valerie Roys, DO as PCP - General (Family Medicine) Telford Nab, RN as Registered Nurse  Cancer Staging No matching staging information was found for the patient.   Oncology History   # July 2019- [place] CA- RUL [s/p Liver Bx]-CT chest- RUL/ liver/ brain solitary lesion.    # Brain metastases- right occipital x 2.5 cm; SBRT [June 10th-24th] [Dr.Crystal]  --------------------------------------------------------------------   DIAGNOSIS: Adenocarcinoma lung  STAGE: 4       ;GOALS: Palliative  CURRENT/MOST RECENT THERAPY -TBD      Primary cancer of right upper lobe of lung ([place])   11/24/2017 -  Chemotherapy    The patient had bevacizumab (AVASTIN) 1,000 mg in sodium chloride 0.9 % 100 mL chemo infusion, 15 mg/kg = 1,000 mg, Intravenous,  Once, 0 of 4 cycles  for chemotherapy treatment.       12/09/2017 -  Chemotherapy    The patient had palonosetron (ALOXI) injection 0.25 mg, 0.25 mg, Intravenous,  Once, 0 of 4 cycles CARBOplatin (PARAPLATIN) 670 mg in sodium chloride 0.9 % 250 mL chemo infusion, 670 mg (original dose ), Intravenous,  Once, 0 of 4 cycles Dose modification:   (Cycle 1, Reason: Provider Judgment) PACLitaxel (TAXOL) 360 mg in sodium chloride 0.9 % 500 mL chemo infusion (> 76m/m2), 200 mg/m2 = 360 mg, Intravenous,  Once, 0 of 4 cycles atezolizumab (TECENTRIQ) 1,200 mg in sodium chloride 0.9 % 250 mL chemo infusion, 1,200 mg, Intravenous, Once, 0 of 4 cycles  for chemotherapy treatment.          INTERVAL HISTORY:  James Celmer89y.o.  male pleasant patient above history of newly diagnosed metastatic adenocarcinoma the lung-is here to review his pathology/NGS testing results.  Continues to have mild shortness of breath and cough.  Otherwise no headaches.  Is currently on tapering dose of steroids.   Review of Systems  Constitutional: Negative for chills, diaphoresis, fever,  malaise/fatigue and weight loss.  HENT: Negative for nosebleeds and sore throat.   Eyes: Negative for double vision.  Respiratory: Positive for cough and shortness of breath. Negative for hemoptysis, sputum production and wheezing.   Cardiovascular: Negative for chest pain, palpitations, orthopnea and leg swelling.  Gastrointestinal: Negative for abdominal pain, blood in stool, constipation, diarrhea, heartburn, melena, nausea and vomiting.  Genitourinary: Negative for dysuria, frequency and urgency.  Musculoskeletal: Negative for back pain and joint pain.  Skin: Negative.  Negative for itching and rash.  Neurological: Negative for dizziness, tingling, focal weakness, weakness and headaches.  Endo/Heme/Allergies: Does not bruise/bleed easily.  Psychiatric/Behavioral: Negative for depression. The patient is not nervous/anxious and does not have insomnia.       PAST MEDICAL HISTORY :  Past Medical History:  Diagnosis Date  . Cancer ([place] 12/01/[place]   Primary cancer of right upper lobe of lung. Mets to liver, brain.  .Marland KitchenPrepatellar bursitis of left knee    patient denies    PAST SURGICAL HISTORY :   Past Surgical History:  Procedure Laterality Date  . collapsed lung  2012   "had to reinflate" " i carried large piece of sheet rock up stairs by myself "   . [place]  [place] , second was with mesh   . TOTAL HIP ARTHROPLASTY Right 04/09/2017   Procedure: RIGHT TOTAL HIP ARTHROPLASTY ANTERIOR APPROACH;  Surgeon: BMcarthur Rossetti MD;  Location: [facility];  Service: Orthopedics;  Laterality: Right;  .  URETHRAL STRICTURE DILATATION      FAMILY HISTORY :  No family history on file.  SOCIAL HISTORY:   Social History   Tobacco Use  . Smoking status: Former Smoker    Types: Cigarettes    Last attempt to quit: 03/18/2012    Years since quitting: 5.7  . Smokeless tobacco: Never Used  . Tobacco comment: 5 years   Substance Use Topics  . Alcohol use: Yes    Comment:  Socially  . Drug use: Yes    Types: Marijuana    Comment: 1 month ago     ALLERGIES:  has No Known Allergies.  MEDICATIONS:  Current Outpatient Medications  Medication Sig Dispense Refill  . aspirin EC 325 MG EC tablet Take 1 tablet (325 mg total) by mouth daily with breakfast. 30 tablet 0  . dexamethasone (DECADRON) 4 MG tablet Take 1 tablet (4 mg total) by mouth 2 (two) times daily. 60 tablet 0  . Multiple Vitamin (MULTIVITAMIN WITH MINERALS) TABS tablet Take 1 tablet by mouth daily.    . ondansetron (ZOFRAN) 8 MG tablet One pill every 8 hours as needed for nausea/vomitting. 40 tablet 1  . prochlorperazine (COMPAZINE) 10 MG tablet Take 1 tablet (10 mg total) by mouth every 6 (six) hours as needed for nausea or vomiting. 40 tablet 1  . tiotropium (SPIRIVA HANDIHALER) 18 MCG inhalation capsule Place 1 capsule (18 mcg total) into inhaler and inhale daily. 30 capsule 2   No current facility-administered medications for this visit.     PHYSICAL EXAMINATION: ECOG PERFORMANCE STATUS: 1 - Symptomatic but completely ambulatory  BP 135/85   Pulse (!) 101   Temp 97.6 F (36.4 C) (Tympanic)   Resp 20   Ht '5\' 8"'  (1.727 m)   Wt 147 lb 12.8 oz (67 kg)   BMI 22.47 kg/m   Filed Weights   12/10/17 1523  Weight: 147 lb 12.8 oz (67 kg)    GENERAL: Well-nourished well-developed; Alert, no distress and comfortable.  Accompanied with his wife. EYES: no pallor or icterus OROPHARYNX: no thrush or ulceration; NECK: supple; no lymph nodes felt. LYMPH:  no palpable lymphadenopathy in the axillary or inguinal regions LUNGS: Decreased breath sounds auscultation bilaterally. No wheeze or crackles HEART/CVS: regular rate & rhythm and no murmurs; No lower extremity edema ABDOMEN:abdomen soft, non-tender and normal bowel sounds. No hepatomegaly or splenomegaly.  Musculoskeletal:no cyanosis of digits and no clubbing  PSYCH: alert & oriented x 3 with fluent speech NEURO: no focal motor/sensory  deficits SKIN:  no rashes or significant lesions    LABORATORY DATA:  I have reviewed the data as listed    Component Value Date/Time   NA 136 12/08/2017 0819   NA 138 08/24/2017 1651   NA 137 04/23/2013 1330   K 4.5 12/08/2017 0819   K 3.8 04/23/2013 1330   CL 107 12/08/2017 0819   CL 105 04/23/2013 1330   CO2 21 (L) 12/08/2017 0819   CO2 22 04/23/2013 1330   GLUCOSE 114 (H) 12/08/2017 0819   GLUCOSE 120 (H) 04/23/2013 1330   BUN 31 (H) 12/08/2017 0819   BUN 18 08/24/2017 1651   BUN 18 04/23/2013 1330   CREATININE 0.83 12/08/2017 0819   CREATININE 0.90 04/23/2013 1330   CALCIUM 8.7 (L) 12/08/2017 0819   CALCIUM 9.3 04/23/2013 1330   PROT 5.6 (L) 12/08/2017 0819   PROT 6.8 08/24/2017 1651   PROT 7.5 04/23/2013 1330   ALBUMIN 3.0 (L) 12/08/2017 0819   ALBUMIN  4.2 08/24/2017 1651   ALBUMIN 3.8 04/23/2013 1330   AST 23 12/08/2017 0819   AST 11 (L) 04/23/2013 1330   ALT 30 12/08/2017 0819   ALT 18 04/23/2013 1330   ALKPHOS 65 12/08/2017 0819   ALKPHOS 64 04/23/2013 1330   BILITOT 0.6 12/08/2017 0819   BILITOT 0.2 08/24/2017 1651   BILITOT 0.6 04/23/2013 1330   GFRNONAA >60 12/08/2017 0819   GFRNONAA >60 04/23/2013 1330   GFRAA >60 12/08/2017 0819   GFRAA >60 04/23/2013 1330    No results found for: SPEP, UPEP  Lab Results  Component Value Date   WBC 13.5 (H) 12/08/2017   NEUTROABS 12.2 (H) 12/08/2017   HGB 13.7 12/08/2017   HCT 40.2 12/08/2017   MCV 98.4 12/08/2017   PLT 141 (L) 12/08/2017      Chemistry      Component Value Date/Time   NA 136 12/08/2017 0819   NA 138 08/24/2017 1651   NA 137 04/23/2013 1330   K 4.5 12/08/2017 0819   K 3.8 04/23/2013 1330   CL 107 12/08/2017 0819   CL 105 04/23/2013 1330   CO2 21 (L) 12/08/2017 0819   CO2 22 04/23/2013 1330   BUN 31 (H) 12/08/2017 0819   BUN 18 08/24/2017 1651   BUN 18 04/23/2013 1330   CREATININE 0.83 12/08/2017 0819   CREATININE 0.90 04/23/2013 1330      Component Value Date/Time   CALCIUM  8.7 (L) 12/08/2017 0819   CALCIUM 9.3 04/23/2013 1330   ALKPHOS 65 12/08/2017 0819   ALKPHOS 64 04/23/2013 1330   AST 23 12/08/2017 0819   AST 11 (L) 04/23/2013 1330   ALT 30 12/08/2017 0819   ALT 18 04/23/2013 1330   BILITOT 0.6 12/08/2017 0819   BILITOT 0.2 08/24/2017 1651   BILITOT 0.6 04/23/2013 1330       RADIOGRAPHIC STUDIES: I have personally reviewed the radiological images as listed and agreed with the findings in the report. No results found.   ASSESSMENT & PLAN:  Primary cancer of right upper lobe of lung ([place]) #Lung adenocarcinoma-stage IV-metastases to contralateral lung; pleura; liver and brain.  Reviewed the imaging of the abdomen pelvis-that shows liver metastases at least 2; bone scan shows right humeral; left fifth rib; L2 vertebral body metastases.  Stable  #NGS negative for targetable mutation/PDL 1.  Given his liver metastases/brain mets/good performance status-I would recommend carbotaxol plus Avastin plus Tecentriq every 3 weeks x 4 cycles followed by maintenance.  # Discussed the potential side effects including but not limited to-increasing fatigue, nausea vomiting, diarrhea, hair loss, sores in the mouth, increase risk of infection and also neuropathy.    I discussed the mechanism of action; The goal of therapy is palliative; and length of treatments are likely ongoing/based upon the results of the scans. Discussed the potential side effects of immunotherapy including but not limited to diarrhea; skin rash; elevated LFTs/endocrine abnormalities etc.  Reviewed the rationale for using Avastin. Discussed the potential side effects including but not limited to elevated blood pressure ; nephrotic syndrome wound healing problems.  #Discussed that I would recommend getting a CT scan after 2 cycles for restaging; and subsequent scans will be done for surveillance.   #Patient understands treatments are palliative not curative.  #Bone mets-patient will also need  Delton See; will discuss further.  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  Improved.  #Plan chemotherapy 7/31; declines port placement at this time.  No labs.;  Postchemotherapy can do labs; possible fluids.  Follow-up with me in 3 weeks/cycle #2.  Labs UA.  # 25 minutes face-to-face with the patient discussing the above plan of care; more than 50% of time spent on prognosis/ natural history; counseling and coordination.     No orders of the defined types were placed in this encounter.  All questions were answered. The patient knows to call the clinic with any problems, questions or concerns.      Cammie Sickle, MD 12/10/2017 9:58 PM

## 2017-12-10 NOTE — Telephone Encounter (Signed)
Hayley-I reviewed the molecular testing results/NGS; please check with the patient if they could come in see me this afternoon/3:15 no labs-to discuss treatment options; and hopefully plan treatment next week.  Thx

## 2017-12-13 NOTE — Progress Notes (Signed)
  Oncology Nurse Navigator Documentation  Navigator Location: [facility] (12/10/17 1600)   )Navigator Encounter Type: Follow-up Appt (12/10/17 1600)                     Patient Visit Type: MedOnc (12/10/17 1600) Treatment Phase: Pre-Tx/Tx Discussion (12/10/17 1600) Barriers/Navigation Needs: Coordination of Care (12/10/17 1600)   Interventions: Coordination of Care (12/10/17 1600)   Coordination of Care: Appts (12/10/17 1600)     met with patient and wife during follow up visit with Dr. Rogue Bussing to discuss results from molecular studies and treatment planning. All questions answered at the time of visit. Pt given copy of results from molecular studies. Reviewed upcoming appts with patient. Instructed to call with any further questions or needs. Pt verbalized understanding.             Time Spent with Patient: 60 (12/10/17 1600)

## 2017-12-16 ENCOUNTER — Encounter: Payer: Self-pay | Admitting: *Deleted

## 2017-12-16 ENCOUNTER — Inpatient Hospital Stay: Payer: BC Managed Care – PPO | Attending: Internal Medicine

## 2017-12-16 VITALS — BP 136/79 | HR 90 | Temp 96.0°F | Resp 20 | Wt 147.8 lb

## 2017-12-16 DIAGNOSIS — I10 Essential (primary) hypertension: Secondary | ICD-10-CM | POA: Diagnosis not present

## 2017-12-16 DIAGNOSIS — Z5111 Encounter for antineoplastic chemotherapy: Secondary | ICD-10-CM | POA: Insufficient documentation

## 2017-12-16 DIAGNOSIS — C7951 Secondary malignant neoplasm of bone: Secondary | ICD-10-CM | POA: Diagnosis not present

## 2017-12-16 DIAGNOSIS — K649 Unspecified hemorrhoids: Secondary | ICD-10-CM | POA: Insufficient documentation

## 2017-12-16 DIAGNOSIS — C3411 Malignant neoplasm of upper lobe, right bronchus or lung: Secondary | ICD-10-CM | POA: Insufficient documentation

## 2017-12-16 DIAGNOSIS — Z7982 Long term (current) use of aspirin: Secondary | ICD-10-CM | POA: Insufficient documentation

## 2017-12-16 DIAGNOSIS — C787 Secondary malignant neoplasm of liver and intrahepatic bile duct: Secondary | ICD-10-CM | POA: Insufficient documentation

## 2017-12-16 DIAGNOSIS — C782 Secondary malignant neoplasm of pleura: Secondary | ICD-10-CM | POA: Diagnosis not present

## 2017-12-16 DIAGNOSIS — Z87891 Personal history of nicotine dependence: Secondary | ICD-10-CM | POA: Diagnosis not present

## 2017-12-16 DIAGNOSIS — M791 Myalgia, unspecified site: Secondary | ICD-10-CM | POA: Insufficient documentation

## 2017-12-16 DIAGNOSIS — C7931 Secondary malignant neoplasm of brain: Secondary | ICD-10-CM | POA: Diagnosis not present

## 2017-12-16 DIAGNOSIS — Z923 Personal history of irradiation: Secondary | ICD-10-CM | POA: Diagnosis not present

## 2017-12-16 DIAGNOSIS — Z79899 Other long term (current) drug therapy: Secondary | ICD-10-CM | POA: Insufficient documentation

## 2017-12-16 MED ORDER — DIPHENHYDRAMINE HCL 50 MG/ML IJ SOLN
50.0000 mg | Freq: Once | INTRAMUSCULAR | Status: AC
Start: 2017-12-16 — End: 2017-12-16
  Administered 2017-12-16: 50 mg via INTRAVENOUS
  Filled 2017-12-16: qty 1

## 2017-12-16 MED ORDER — SODIUM CHLORIDE 0.9 % IV SOLN
667.8000 mg | Freq: Once | INTRAVENOUS | Status: AC
  Administered 2017-12-16: 670 mg via INTRAVENOUS
  Filled 2017-12-16: qty 67

## 2017-12-16 MED ORDER — FAMOTIDINE IN NACL 20-0.9 MG/50ML-% IV SOLN
20.0000 mg | Freq: Once | INTRAVENOUS | Status: AC
  Administered 2017-12-16: 20 mg via INTRAVENOUS
  Filled 2017-12-16: qty 50

## 2017-12-16 MED ORDER — SODIUM CHLORIDE 0.9 % IV SOLN
Freq: Once | INTRAVENOUS | Status: AC
  Administered 2017-12-16: 09:00:00 via INTRAVENOUS
  Filled 2017-12-16: qty 1000

## 2017-12-16 MED ORDER — PACLITAXEL CHEMO INJECTION 300 MG/50ML
200.0000 mg/m2 | Freq: Once | INTRAVENOUS | Status: AC
  Administered 2017-12-16: 360 mg via INTRAVENOUS
  Filled 2017-12-16: qty 60

## 2017-12-16 MED ORDER — HEPARIN SOD (PORK) LOCK FLUSH 100 UNIT/ML IV SOLN: 500.0000 [IU] | Freq: Once | INTRAVENOUS | Status: DC | PRN

## 2017-12-16 MED ORDER — SODIUM CHLORIDE 0.9 % IV SOLN
1200.0000 mg | Freq: Once | INTRAVENOUS | Status: AC
  Administered 2017-12-16: 1200 mg via INTRAVENOUS
  Filled 2017-12-16: qty 20

## 2017-12-16 MED ORDER — PALONOSETRON HCL INJECTION 0.25 MG/5ML
0.2500 mg | Freq: Once | INTRAVENOUS | Status: AC
  Administered 2017-12-16: 0.25 mg via INTRAVENOUS
  Filled 2017-12-16: qty 5

## 2017-12-16 MED ORDER — SODIUM CHLORIDE 0.9 % IV SOLN
15.0000 mg/kg | Freq: Once | INTRAVENOUS | Status: AC
  Administered 2017-12-16: 1000 mg via INTRAVENOUS
  Filled 2017-12-16: qty 32

## 2017-12-16 MED ORDER — SODIUM CHLORIDE 0.9 % IV SOLN
20.0000 mg | Freq: Once | INTRAVENOUS | Status: AC
  Administered 2017-12-16: 20 mg via INTRAVENOUS
  Filled 2017-12-16: qty 2

## 2017-12-16 NOTE — Progress Notes (Signed)
  Oncology Nurse Navigator Documentation  Navigator Location: [facility] (12/16/17 0900)   )Navigator Encounter Type: Treatment (12/16/17 0900)                   Treatment Initiated Date: 12/16/17 (12/16/17 0900)   Treatment Phase: First Chemo Tx (12/16/17 0900) Barriers/Navigation Needs: No barriers at this time (12/16/17 0900)   Interventions: None required (12/16/17 0900)                      Time Spent with Patient: 30 (12/16/17 0900)

## 2017-12-21 ENCOUNTER — Other Ambulatory Visit: Payer: Self-pay | Admitting: *Deleted

## 2017-12-21 ENCOUNTER — Inpatient Hospital Stay: Payer: BC Managed Care – PPO

## 2017-12-21 ENCOUNTER — Encounter: Payer: Self-pay | Admitting: Nurse Practitioner

## 2017-12-21 ENCOUNTER — Telehealth: Payer: Self-pay | Admitting: *Deleted

## 2017-12-21 ENCOUNTER — Inpatient Hospital Stay (HOSPITAL_BASED_OUTPATIENT_CLINIC_OR_DEPARTMENT_OTHER): Payer: BC Managed Care – PPO | Admitting: Nurse Practitioner

## 2017-12-21 VITALS — BP 153/114 | HR 108 | Temp 97.0°F | Resp 16 | Wt 143.4 lb

## 2017-12-21 DIAGNOSIS — C782 Secondary malignant neoplasm of pleura: Secondary | ICD-10-CM

## 2017-12-21 DIAGNOSIS — C7931 Secondary malignant neoplasm of brain: Secondary | ICD-10-CM

## 2017-12-21 DIAGNOSIS — C787 Secondary malignant neoplasm of liver and intrahepatic bile duct: Secondary | ICD-10-CM | POA: Diagnosis not present

## 2017-12-21 DIAGNOSIS — K921 Melena: Secondary | ICD-10-CM

## 2017-12-21 DIAGNOSIS — C3411 Malignant neoplasm of upper lobe, right bronchus or lung: Secondary | ICD-10-CM

## 2017-12-21 DIAGNOSIS — K625 Hemorrhage of anus and rectum: Secondary | ICD-10-CM

## 2017-12-21 DIAGNOSIS — C7951 Secondary malignant neoplasm of bone: Secondary | ICD-10-CM

## 2017-12-21 DIAGNOSIS — I10 Essential (primary) hypertension: Secondary | ICD-10-CM

## 2017-12-21 DIAGNOSIS — E860 Dehydration: Secondary | ICD-10-CM

## 2017-12-21 DIAGNOSIS — Z79899 Other long term (current) drug therapy: Secondary | ICD-10-CM

## 2017-12-21 DIAGNOSIS — M791 Myalgia, unspecified site: Secondary | ICD-10-CM

## 2017-12-21 DIAGNOSIS — K649 Unspecified hemorrhoids: Secondary | ICD-10-CM

## 2017-12-21 LAB — COMPREHENSIVE METABOLIC PANEL
ALK PHOS: 66 U/L (ref 38–126)
ALT: 37 U/L (ref 0–44)
ANION GAP: 9 (ref 5–15)
AST: 31 U/L (ref 15–41)
Albumin: 3.4 g/dL — ABNORMAL LOW (ref 3.5–5.0)
BUN: 23 mg/dL (ref 8–23)
CALCIUM: 9.4 mg/dL (ref 8.9–10.3)
CO2: 22 mmol/L (ref 22–32)
Chloride: 103 mmol/L (ref 98–111)
Creatinine, Ser: 0.84 mg/dL (ref 0.61–1.24)
GFR calc non Af Amer: >60 mL/min (ref >60)
Glucose, Bld: 102 mg/dL — ABNORMAL HIGH (ref 70–99)
Potassium: 4 mmol/L (ref 3.5–5.1)
SODIUM: 134 mmol/L — AB (ref 135–145)
Total Bilirubin: 0.7 mg/dL (ref 0.3–1.2)
Total Protein: 6.5 g/dL (ref 6.5–8.1)

## 2017-12-21 LAB — CBC WITH DIFFERENTIAL/PLATELET
BASOS PCT: 1 %
Basophils Absolute: 0 10*3/uL (ref 0–0.1)
EOS ABS: 0.2 10*3/uL (ref 0–0.7)
EOS PCT: 3 %
HCT: 39.7 % — ABNORMAL LOW (ref 40.0–52.0)
Hemoglobin: 13.7 g/dL (ref 13.0–18.0)
Lymphocytes Relative: 28 %
Lymphs Abs: 1.4 10*3/uL (ref 1.0–3.6)
MCH: 33.1 pg (ref 26.0–34.0)
MCHC: 34.6 g/dL (ref 32.0–36.0)
MCV: 95.7 fL (ref 80.0–100.0)
MONO ABS: 0.1 10*3/uL — AB (ref 0.2–1.0)
MONOS PCT: 1 %
NEUTROS ABS: 3.4 10*3/uL (ref 1.4–6.5)
Neutrophils Relative %: 67 %
PLATELETS: 167 10*3/uL (ref 150–440)
RBC: 4.15 MIL/uL — ABNORMAL LOW (ref 4.40–5.90)
RDW: 13.3 % (ref 11.5–14.5)
WBC: 5 10*3/uL (ref 3.8–10.6)

## 2017-12-21 LAB — SAMPLE TO BLOOD BANK

## 2017-12-21 MED ORDER — SODIUM CHLORIDE 0.9 % IV SOLN
Freq: Once | INTRAVENOUS | Status: AC
  Administered 2017-12-21: 11:00:00 via INTRAVENOUS
  Filled 2017-12-21: qty 1000

## 2017-12-21 MED ORDER — LISINOPRIL-HYDROCHLOROTHIAZIDE 20-12.5 MG PO TABS: 1.0000 | ORAL_TABLET | Freq: Every day | ORAL | 0 refills | Status: DC

## 2017-12-21 NOTE — Telephone Encounter (Signed)
Called to report that patient is having bright red blood in toilet with BM this morning. He is also complinaing of having aches in his joints and is asking to be seen. Appointment given for this AM

## 2017-12-21 NOTE — Patient Instructions (Addendum)
HAPPY BIRTHDAY! It was a pleasure meeting you today and thank you for allowing me to participate in your care. I've included some information about the new blood pressure medication I've prescribed below. Patient advised to notify the clinic if there is no improvement in symptoms or if symptoms worsen in next 3-4 days.  -Beckey Rutter, NP  Hydrochlorothiazide, HCTZ; Lisinopril tablets What is this medicine? HYDROCHLOROTHIAZIDE; LISINOPRIL (hye droe klor oh THYE a zide; lyse IN oh pril) is a combination of a diuretic and an ACE inhibitor. It is used to treat high blood pressure. This medicine may be used for other purposes; ask your health care provider or pharmacist if you have questions. COMMON BRAND NAME(S): Prinzide, Zestoretic What should I tell my health care provider before I take this medicine? They need to know if you have any of these conditions: -bone marrow disease -decreased urine -heart or blood vessel disease -if you are on a special diet like a low salt diet -immune system problems, like lupus -kidney disease -liver disease -previous swelling of the tongue, face, or lips with difficulty breathing, difficulty swallowing, hoarseness, or tightening of the throat -recent heart attack or stroke -an unusual or allergic reaction to lisinopril, hydrochlorothiazide, sulfa drugs, other medicines, insect venom, foods, dyes, or preservatives -pregnant or trying to get pregnant -breast-feeding How should I use this medicine? Take this medicine by mouth with a glass of water. Follow the directions on the prescription label. You can take it with or without food. If it upsets your stomach, take it with food. Take your medicine at regular intervals. Do not take it more often than directed. Do not stop taking except on your doctor's advice. Talk to your pediatrician regarding the use of this medicine in children. Special care may be needed. Overdosage: If you think you have taken too much of  this medicine contact a poison control center or emergency room at once. NOTE: This medicine is only for you. Do not share this medicine with others. What if I miss a dose? If you miss a dose, take it as soon as you can. If it is almost time for your next dose, take only that dose. Do not take double or extra doses. What may interact with this medicine? Do not take this medication with any of the following medications: -sacubitril; valsartan This medicine may also interact with the following: -barbiturates like phenobarbital -blood pressure medicines -corticosteroids like prednisone -diabetic medications -diuretics, especially triamterene, spironolactone or amiloride -lithium -NSAIDs, medicines for pain and inflammation, like ibuprofen or naproxen -potassium salts or potassium supplements -prescription pain medicines -skeletal muscle relaxants like tubocurarine -some cholesterol lowering medications like cholestyramine or colestipol This list may not describe all possible interactions. Give your health care provider a list of all the medicines, herbs, non-prescription drugs, or dietary supplements you use. Also tell them if you smoke, drink alcohol, or use illegal drugs. Some items may interact with your medicine. What should I watch for while using this medicine? Visit your doctor or health care professional for regular checks on your progress. Check your blood pressure as directed. Ask your doctor or health care professional what your blood pressure should be and when you should contact him or her. Call your doctor or health care professional if you notice an irregular or fast heart beat. You must not get dehydrated. Ask your doctor or health care professional how much fluid you need to drink a day. Check with him or her if you get an attack of  severe diarrhea, nausea and vomiting, or if you sweat a lot. The loss of too much body fluid can make it dangerous for you to take this  medicine. Women should inform their doctor if they wish to become pregnant or think they might be pregnant. There is a potential for serious side effects to an unborn child. Talk to your health care professional or pharmacist for more information. You may get drowsy or dizzy. Do not drive, use machinery, or do anything that needs mental alertness until you know how this drug affects you. Do not stand or sit up quickly, especially if you are an older patient. This reduces the risk of dizzy or fainting spells. Alcohol can make you more drowsy and dizzy. Avoid alcoholic drinks. This medicine may affect your blood sugar level. If you have diabetes, check with your doctor or health care professional before changing the dose of your diabetic medicine. Avoid salt substitutes unless you are told otherwise by your doctor or health care professional. This medicine can make you more sensitive to the sun. Keep out of the sun. If you cannot avoid being in the sun, wear protective clothing and use sunscreen. Do not use sun lamps or tanning beds/booths. Do not treat yourself for coughs, colds, or pain while you are taking this medicine without asking your doctor or health care professional for advice. Some ingredients may increase your blood pressure. What side effects may I notice from receiving this medicine? Side effects that you should report to your doctor or health care professional as soon as possible: -changes in vision -confusion, dizziness, light headedness or fainting spells -decreased amount of urine passed -difficulty breathing or swallowing, hoarseness, or tightening of the throat -eye pain -fast or irregular heart beat, palpitations, or chest pain -muscle cramps -nausea and vomiting -persistent dry cough -redness, blistering, peeling or loosening of the skin, including inside the mouth -stomach pain -swelling of your face, lips, tongue, hands, or feet -unusual rash, bleeding or bruising, or  pinpoint red spots on the skin -worsened gout pain -yellowing of the eyes or skin Side effects that usually do not require medical attention (report to your doctor or health care professional if they continue or are bothersome): -change in sex drive or performance -cough -headache This list may not describe all possible side effects. Call your doctor for medical advice about side effects. You may report side effects to FDA at 1-800-FDA-1088. Where should I keep my medicine? Keep out of the reach of children. Store at room temperature between 20 and 25 degrees C (68 and 77 degrees F). Protect from moisture and excessive light. Keep container tightly closed. Throw away any unused medicine after the expiration date. NOTE: This sheet is a summary. It may not cover all possible information. If you have questions about this medicine, talk to your doctor, pharmacist, or health care provider.  2018 Elsevier/Gold Standard (2015-06-28 11:42:20)

## 2017-12-21 NOTE — Progress Notes (Signed)
[facility] [place]  [phone] Fax:([phone]  Patient Care Team: Valerie Roys, DO as PCP - General (Family Medicine) Telford Nab, RN as Registered Nurse Cammie Sickle, MD as Medical Oncologist (Medical Oncology)   Name of the patient: James [name]  [id number]  1956/01/07   Date of visit: 12/21/17  Diagnosis- Lung Cancer  Chief complaint/ Reason for visit- Rectal Bleeding & Joint Pain  Heme/Onc history:  Oncology History   # July 2019- [facility]- RUL [s/p Liver Bx]-CT chest- RUL/ liver/ brain solitary lesion.    # Brain metastases- right occipital x 2.5 cm; SBRT [June 10th-24th] [Dr.Crystal]  --------------------------------------------------------------------   DIAGNOSIS: Adenocarcinoma lung  STAGE: 2       ;GOALS: Palliative  CURRENT/MOST RECENT THERAPY -TBD      Primary cancer of right upper lobe of lung ([place])   11/24/2017 -  Chemotherapy    The patient had bevacizumab (AVASTIN) 1,000 mg in sodium chloride 0.9 % 100 mL chemo infusion, 15 mg/kg = 1,000 mg, Intravenous,  Once, 1 of 4 cycles Administration: 1,000 mg (12/16/2017)  for chemotherapy treatment.       12/09/2017 -  Chemotherapy    The patient had palonosetron (ALOXI) injection 0.25 mg, 0.25 mg, Intravenous,  Once, 1 of 4 cycles Administration: 0.25 mg (12/16/2017) CARBOplatin (PARAPLATIN) 670 mg in sodium chloride 0.9 % 250 mL chemo infusion, 670 mg (100 % of original dose 667.8 mg), Intravenous,  Once, 1 of 4 cycles Dose modification:   (original dose 667.8 mg, Cycle 1, Reason: Provider Judgment) PACLitaxel (TAXOL) 360 mg in sodium chloride 0.9 % 500 mL chemo infusion (> 80mg /m2), 200 mg/m2 = 360 mg, Intravenous,  Once, 1 of 4 cycles Administration: 360 mg (12/16/2017) atezolizumab (TECENTRIQ) 1,200 mg in sodium chloride 0.9 % 250 mL chemo infusion, 1,200 mg, Intravenous, Once, 1 of 4 cycles Administration: 1,200 mg (12/16/2017)  for  chemotherapy treatment.        Interval history-   James [name] reports evaluation of rectal bleeding. Onset of symptoms was abrupt starting 1 day ago ago with rapidly improving course since that time.  He describes symptoms as bleeding which only occurs with bowel movements. Treatment to date has been none. Patient denies maroon colored stools.  Patient states that symptoms are consistent with previous episodes of hemorrhoidal bleeding.  He states that his bowel movement was normal appearing color, quantity, and consistency.  States that bleeding resolved after BM and he has had no more rectal pain, itching, or bleeding since that time.  He denies issues with constipation and denies straining with defecation.  Patient complains of arthralgias and myalgias for which has been present for 3 days. Pain is located in multiple joints and legs/knees, is described as aching, and is intermittent, moderate .  Associated symptoms include: none.  The patient has tried nothing for pain relief.  Related to injury: no.  States that he was feeling "dehydrated" at home and felt that the general achiness and his hemorrhoids are made worse when he is dehydrated.  Nursing also reports elevated blood pressure today.  Patient has not previously had elevated blood pressures and this is new since most recent cycle of chemotherapy. Cardiac symptoms none. Patient denies chest pressure/discomfort, claudication, dyspnea, exertional chest pressure/discomfort and lower extremity edema.  Cardiovascular risk factors: advanced age (older than 36 for men, 50 for women) and male gender. Use of agents associated with hypertension: steroids with chemotherapy treatment and Avastin.   Denies  any neurologic complaints. Denies recent fevers or illnesses. Denies any easy bleeding or bruising. Reports good appetite and denies weight loss. Denies chest pain. Denies any nausea, vomiting, constipation, or diarrhea. Denies urinary complaints.  Patient offers no further specific complaints today.  ECOG FS:1 - Symptomatic but completely ambulatory  Review of systems- Review of Systems  Constitutional: Negative for chills, fever, malaise/fatigue and weight loss.  HENT: Negative for congestion, ear discharge, ear pain, nosebleeds, sinus pain, sore throat and tinnitus.   Eyes: Negative.   Respiratory: Negative.  Negative for cough, sputum production and shortness of breath.   Cardiovascular: Negative for chest pain, palpitations, orthopnea, claudication and leg swelling.  Gastrointestinal: Negative for abdominal pain, blood in stool, constipation, diarrhea, heartburn, nausea and vomiting.       Blood in toilet w/ defecating  Genitourinary: Negative.   Musculoskeletal: Positive for joint pain (knees/hips) and myalgias.  Skin: Negative.   Neurological: Negative for dizziness, tingling, weakness and headaches.  Endo/Heme/Allergies: Negative.   Psychiatric/Behavioral: Negative.     Current treatment- Carbo-Taxol + Avastin + Tecentriq (12/16/17)  No Known Allergies  Past Medical History:  Diagnosis Date  . Cancer ([place]) 12/01/2017   Primary cancer of right upper lobe of lung. Mets to liver, brain.  Marland Kitchen Prepatellar bursitis of left knee    patient denies    Past Surgical History:  Procedure Laterality Date  . collapsed lung  2012   "had to reinflate" " i carried large piece of sheet rock up stairs by myself "   . [place]   x2 , second was with mesh   . TOTAL HIP ARTHROPLASTY Right 04/09/2017   Procedure: RIGHT TOTAL HIP ARTHROPLASTY ANTERIOR APPROACH;  Surgeon: Mcarthur Rossetti, MD;  Location: [facility];  Service: Orthopedics;  Laterality: Right;  . URETHRAL STRICTURE DILATATION      Social History   Socioeconomic History  . Marital status: Married    Spouse name: Not on file  . Number of children: Not on file  . Years of education: Not on file  . Highest education level: Not on file  Occupational  History  . Not on file  Social Needs  . Financial resource strain: Not on file  . Food insecurity:    Worry: Not on file    Inability: Not on file  . Transportation needs:    Medical: Not on file    Non-medical: Not on file  Tobacco Use  . Smoking status: Former Smoker    Types: Cigarettes    Last attempt to quit: 03/18/2012    Years since quitting: 5.7  . Smokeless tobacco: Never Used  . Tobacco comment: 5 years   Substance and Sexual Activity  . Alcohol use: Yes    Comment: Socially  . Drug use: Yes    Types: Marijuana    Comment: 1 month ago   . Sexual activity: Not Currently  Lifestyle  . Physical activity:    Days per week: Not on file    Minutes per session: Not on file  . Stress: Not on file  Relationships  . Social connections:    Talks on phone: Not on file    Gets together: Not on file    Attends religious service: Not on file    Active member of club or organization: Not on file    Attends meetings of clubs or organizations: Not on file    Relationship status: Not on file  . Intimate partner violence:  Fear of current or ex partner: Not on file    Emotionally abused: Not on file    Physically abused: Not on file    Forced sexual activity: Not on file  Other Topics Concern  . Not on file  Social History Narrative   Father and mother with hypertension.     History reviewed. No pertinent family history.   Current Outpatient Medications:  .  dexamethasone (DECADRON) 4 MG tablet, Take 1 tablet (4 mg total) by mouth 2 (two) times daily., Disp: 60 tablet, Rfl: 0 .  Multiple Vitamin (MULTIVITAMIN WITH MINERALS) TABS tablet, Take 1 tablet by mouth daily., Disp: , Rfl:  .  ondansetron (ZOFRAN) 8 MG tablet, One pill every 8 hours as needed for nausea/vomitting., Disp: 40 tablet, Rfl: 1 .  prochlorperazine (COMPAZINE) 10 MG tablet, Take 1 tablet (10 mg total) by mouth every 6 (six) hours as needed for nausea or vomiting., Disp: 40 tablet, Rfl: 1 .  tiotropium  (SPIRIVA HANDIHALER) 18 MCG inhalation capsule, Place 1 capsule (18 mcg total) into inhaler and inhale daily., Disp: 30 capsule, Rfl: 2 .  lisinopril-hydrochlorothiazide (PRINZIDE,ZESTORETIC) 20-12.5 MG tablet, Take 1 tablet by mouth daily., Disp: 30 tablet, Rfl: 0  Physical exam:  Vitals:   12/21/17 1053  BP: (!) 153/114  Pulse: (!) 108  Resp: 16  Temp: (!) 97 F (36.1 C)  TempSrc: Tympanic  SpO2: 96%  Weight: 143 lb 6.4 oz (65 kg)   Physical Exam  Constitutional: He is oriented to person, place, and time. He appears well-developed and well-nourished. No distress.  accompanied  HENT:  Head: Normocephalic and atraumatic.  Nose: Nose normal.  Mouth/Throat: Oropharynx is clear and moist. No oropharyngeal exudate.  Eyes: Pupils are equal, round, and reactive to light. Conjunctivae are normal. No scleral icterus.  Neck: Normal range of motion. Neck supple.  Cardiovascular: Regular rhythm and normal heart sounds. Tachycardia present.  Pulmonary/Chest: Effort normal and breath sounds normal. He has no wheezes.  Abdominal: Soft. Bowel sounds are normal. He exhibits no distension. There is no tenderness.  Genitourinary:  Genitourinary Comments: Deferred per pt request  Musculoskeletal: Normal range of motion. He exhibits no edema, tenderness or deformity.  Neurological: He is alert and oriented to person, place, and time.  Skin: Skin is warm and dry.  Psychiatric: He has a normal mood and affect. His behavior is normal.     CMP Latest Ref Rng & Units 12/21/2017  Glucose 70 - 99 mg/dL 102(H)  BUN 8 - 23 mg/dL 23  Creatinine 0.61 - 1.24 mg/dL 0.84  Sodium 135 - 145 mmol/L 134(L)  Potassium 3.5 - 5.1 mmol/L 4.0  Chloride 98 - 111 mmol/L 103  CO2 22 - 32 mmol/L 22  Calcium 8.9 - 10.3 mg/dL 9.4  Total Protein 6.5 - 8.1 g/dL 6.5  Total Bilirubin 0.3 - 1.2 mg/dL 0.7  Alkaline Phos 38 - 126 U/L 66  AST 15 - 41 U/L 31  ALT 0 - 44 U/L 37   CBC Latest Ref Rng & Units 12/21/2017  WBC 3.8 -  10.6 K/uL 5.0  Hemoglobin 13.0 - 18.0 g/dL 13.7  Hematocrit 40.0 - 52.0 % 39.7(L)  Platelets 150 - 440 K/uL 167   No images are attached to the encounter.  Nm Bone Scan Whole Body  Result Date: 12/02/2017 CLINICAL DATA:  Lung carcinoma.  New diagnosis EXAM: NUCLEAR MEDICINE WHOLE BODY BONE SCAN TECHNIQUE: Whole body anterior and posterior images were obtained approximately 3 hours after intravenous injection  of radiopharmaceutical. RADIOPHARMACEUTICALS:  23.8 mCi Technetium-50m MDP IV COMPARISON:  CT chest 11/05/2017 FINDINGS: Focal uptake within the posterolateral fifth LEFT rib corresponds to subtle sclerosis sclerotic lesion on comparison CT. Focal uptake within the proximal third of the RIGHT humeral diaphysis consistent metastatic lesion. Focal uptake within the LEFT aspect of the L2 vertebral body is also suspicious for metastasis. This lesion is identified on CT 12/01/2017 IMPRESSION: Skeletal metastasis involving the RIGHT humerus, LEFT fifth rib, and L2 vertebral body. Electronically Signed   By: Suzy Bouchard M.D.   On: 12/02/2017 09:09   Ct Abdomen Pelvis W Contrast  Result Date: 12/01/2017 CLINICAL DATA:  Recently diagnosed with lung cancer and brain metastasis, status post radiation therapy. EXAM: CT ABDOMEN AND PELVIS WITH CONTRAST TECHNIQUE: Multidetector CT imaging of the abdomen and pelvis was performed using the standard protocol following bolus administration of intravenous contrast. CONTRAST:  133mL OMNIPAQUE IOHEXOL 300 MG/ML  SOLN COMPARISON:  Chest CT 11/05/2017. Abdominopelvic CT of 01/28/2016. FINDINGS: Lower chest: Right greater than left patchy airspace disease. These are felt to be similar to 11/05/2017. Normal heart size without pericardial or pleural effusion. Normal, without mass or ductal dilatation. Hepatobiliary: Bilateral low-density liver lesions. These are primarily too small to characterize but felt to be new compared to 01/28/2016. A dominant right hepatic lobe  3.1 x 2.0 cm lesion on 29/2 is new since that exam, consistent with metastatic disease. Immediately anterior inferior 1.4 cm lesion is also new and consistent with metastasis. Normal gallbladder, without biliary ductal dilatation. Pancreas: Normal, without mass or ductal dilatation. Spleen: Normal in size, without focal abnormality. Adrenals/Urinary Tract: Normal adrenal glands. Upper pole right renal cysts. Too small to characterize left renal lesions. No hydronephrosis. Degraded evaluation of the pelvis, secondary to beam hardening artifact from right hip arthroplasty. Grossly normal urinary bladder. Stomach/Bowel: Normal stomach, without wall thickening. Scattered colonic diverticula. Normal terminal ileum and appendix. Normal small bowel. Vascular/Lymphatic: Aortic and branch vessel atherosclerosis. Circumaortic left renal vein. No abdominopelvic adenopathy. Reproductive: Normal prostate. Other: No significant free fluid. No evidence of omental or peritoneal disease. Prior extensive ventral and inguinal hernia repairs. Suspect residual or recurrent tiny fat containing left inguinal hernia. Musculoskeletal: Right hip arthroplasty. Sclerotic lesions within the L2 and L3 vertebral bodies are new since 01/28/2016. Disc bulges at L4-5 and L5-S1. IMPRESSION: 1. Metastatic disease within the liver and bones, as detailed above. 2.  Aortic Atherosclerosis (ICD10-I70.0). Electronically Signed   By: Abigail Miyamoto M.D.   On: 12/01/2017 16:56   Assessment and plan- Patient is a 62 y.o. male with lung cancer who [facility] for rectal bleeding, myalgias, and hypertension.  1.  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  Currently s/p cycle 1 of carbo-taxol-avastin-tecentriq, given with palliative intent.  Plans to repeat imaging after 2 cycles for restaging.  Continue to follow-up with Dr. Rogue Bussing as scheduled.  2. Rectal Bleeding- consistent with  hemorrhoids. Not actively bleeding. Counts today reviewed.  Last colonoscopy 9/272012 with recommendation to repeat in 01/2021.  Discussed use of hydrocortisone cream to area but patient declined and wishes to try OTC Preparation H at this time.  Advised patient to notify clinic if additional episodes of bleeding observed.  Encouraged to reduce risk of constipation increase fluid intake.  Continue to monitor.  3. Myalgias- etiology unclear but poss r/t chemo vs dehydration vs others. Discussed taking Tylenol for symptoms, not to exceed 3 g in 24 hours. IV fluids given  in clinic today.  Continue to monitor.  4.  Hypertension-elevated systolic and diastolic blood pressure noted in clinic and did not improve on recheck.  No prior history of hypertension not currently on blood pressure medication.  Suspect possibly related to Avastin.  Discussed with Dr. Rogue Bussing who recommended initiating lisinopril-hctz 20-12/5 daily.   rtc on 12/24/17 for labs, reevaluation in [facility], and possible fluids.    Visit Diagnosis 1. Primary cancer of right upper lobe of lung ([place])   2. Hemorrhoids, unspecified hemorrhoid type     Patient expressed understanding and was in agreement with this plan. He also understands that He can call clinic at any time with any questions, concerns, or complaints.   Thank you for allowing me to participate in the care of this very pleasant patient.   Beckey Rutter, DNP, AGNP-C [place] [facility] [phone] (work cell) [phone] (office) 12/21/17 1:44 PM

## 2017-12-24 ENCOUNTER — Encounter: Payer: Self-pay | Admitting: Nurse Practitioner

## 2017-12-24 ENCOUNTER — Inpatient Hospital Stay (HOSPITAL_BASED_OUTPATIENT_CLINIC_OR_DEPARTMENT_OTHER): Payer: BC Managed Care – PPO | Admitting: Nurse Practitioner

## 2017-12-24 ENCOUNTER — Inpatient Hospital Stay: Payer: BC Managed Care – PPO

## 2017-12-24 VITALS — BP 110/80 | HR 111 | Temp 96.9°F | Resp 18 | Wt 141.7 lb

## 2017-12-24 DIAGNOSIS — C3411 Malignant neoplasm of upper lobe, right bronchus or lung: Secondary | ICD-10-CM | POA: Diagnosis not present

## 2017-12-24 DIAGNOSIS — Z79899 Other long term (current) drug therapy: Secondary | ICD-10-CM

## 2017-12-24 DIAGNOSIS — C787 Secondary malignant neoplasm of liver and intrahepatic bile duct: Secondary | ICD-10-CM | POA: Diagnosis not present

## 2017-12-24 DIAGNOSIS — M791 Myalgia, unspecified site: Secondary | ICD-10-CM

## 2017-12-24 DIAGNOSIS — C782 Secondary malignant neoplasm of pleura: Secondary | ICD-10-CM

## 2017-12-24 DIAGNOSIS — I10 Essential (primary) hypertension: Secondary | ICD-10-CM

## 2017-12-24 DIAGNOSIS — C7951 Secondary malignant neoplasm of bone: Secondary | ICD-10-CM

## 2017-12-24 DIAGNOSIS — C3490 Malignant neoplasm of unspecified part of unspecified bronchus or lung: Secondary | ICD-10-CM

## 2017-12-24 DIAGNOSIS — K649 Unspecified hemorrhoids: Secondary | ICD-10-CM

## 2017-12-24 DIAGNOSIS — C7931 Secondary malignant neoplasm of brain: Secondary | ICD-10-CM | POA: Diagnosis not present

## 2017-12-24 DIAGNOSIS — E860 Dehydration: Secondary | ICD-10-CM

## 2017-12-24 DIAGNOSIS — Z923 Personal history of irradiation: Secondary | ICD-10-CM

## 2017-12-24 LAB — CBC WITH DIFFERENTIAL/PLATELET
BASOS ABS: 0 10*3/uL (ref 0–0.1)
BASOS PCT: 1 %
Eosinophils Absolute: 0 10*3/uL (ref 0–0.7)
Eosinophils Relative: 1 %
HCT: 38.2 % — ABNORMAL LOW (ref 40.0–52.0)
HEMOGLOBIN: 13.3 g/dL (ref 13.0–18.0)
Lymphocytes Relative: 11 %
Lymphs Abs: 0.4 10*3/uL — ABNORMAL LOW (ref 1.0–3.6)
MCH: 33.2 pg (ref 26.0–34.0)
MCHC: 34.8 g/dL (ref 32.0–36.0)
MCV: 95.3 fL (ref 80.0–100.0)
MONOS PCT: 3 %
Monocytes Absolute: 0.1 10*3/uL — ABNORMAL LOW (ref 0.2–1.0)
NEUTROS PCT: 84 %
Neutro Abs: 3.1 10*3/uL (ref 1.4–6.5)
Platelets: 188 10*3/uL (ref 150–440)
RBC: 4.01 MIL/uL — ABNORMAL LOW (ref 4.40–5.90)
RDW: 13.1 % (ref 11.5–14.5)
WBC: 3.6 10*3/uL — ABNORMAL LOW (ref 3.8–10.6)

## 2017-12-24 LAB — COMPREHENSIVE METABOLIC PANEL
ALT: 31 U/L (ref 0–44)
ANION GAP: 10 (ref 5–15)
AST: 32 U/L (ref 15–41)
Albumin: 3.4 g/dL — ABNORMAL LOW (ref 3.5–5.0)
Alkaline Phosphatase: 68 U/L (ref 38–126)
BILIRUBIN TOTAL: 0.7 mg/dL (ref 0.3–1.2)
BUN: 19 mg/dL (ref 8–23)
CALCIUM: 9.1 mg/dL (ref 8.9–10.3)
CO2: 22 mmol/L (ref 22–32)
CREATININE: 1.08 mg/dL (ref 0.61–1.24)
Chloride: 101 mmol/L (ref 98–111)
Glucose, Bld: 175 mg/dL — ABNORMAL HIGH (ref 70–99)
Potassium: 4.7 mmol/L (ref 3.5–5.1)
Sodium: 133 mmol/L — ABNORMAL LOW (ref 135–145)
TOTAL PROTEIN: 6.9 g/dL (ref 6.5–8.1)

## 2017-12-24 LAB — MAGNESIUM: Magnesium: 1.7 mg/dL (ref 1.7–2.4)

## 2017-12-24 NOTE — Progress Notes (Signed)
[facility]  [phone] [phone]  Patient Care Team: Valerie Roys, DO as PCP - General (Family Medicine) Telford Nab, RN as Registered Nurse Cammie Sickle, MD as Medical Oncologist (Medical Oncology)   Name of the patient: James [name]  [id number]  22-Sep-1955   Date of visit: 12/24/17  Diagnosis- Lung Cancer  Chief complaint/ Reason for visit- f/u for elevated BP & rectal bleeding  Heme/Onc history:  Oncology History   # July 2019- [facility] CA- RUL [s/p Liver Bx]-CT chest- RUL/ liver/ brain solitary lesion.    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     12/09/2017 -  Chemotherapy    The patient had palonosetron (ALOXI) injection 0.25 mg, 0.25 mg, Intravenous,  Once, 1 of 4 cycles Administration: 0.25 mg (12/16/2017) CARBOplatin (PARAPLATIN) 670 mg in sodium chloride 0.9 % 250 mL chemo infusion, 670 mg (100 % of original dose 667.8 mg), Intravenous,  Once, 1 of 4 cycles Dose modification:   (original dose 667.8 mg, Cycle 1, Reason: Provider Judgment) PACLitaxel (TAXOL) 360 mg in sodium chloride 0.9 % 500 mL chemo infusion (> 80mg /m2), 200 mg/m2 = 360 mg, Intravenous,  Once, 1 of 4 cycles Administration: 360 mg (12/16/2017) atezolizumab (TECENTRIQ) 1,200 mg in sodium chloride 0.9 % 250 mL chemo infusion, 1,200 mg, Intravenous, Once, 1 of 4 cycles Administration: 1,200 mg (12/16/2017)  for  chemotherapy treatment.      Interval history- Judith Campillo, 62 year old male, who presents to [facility] for re-evaluation of rectal bleeding and elevated blood pressure. He was last seen in [facility] on 12/21/17. At that time he complained of some bleeding with bowel movements though to be related to hemorrhoids. He also complained of aches and pains since his chemotherapy treatment. On assessment he was noted to have elevated blood pressure which was new since starting chemotherapy. He was started on preparation h for hemorrhoids and discussed avoidance   ECOG FS:1 - Symptomatic but completely ambulatory  Review of systems- Review of Systems  Constitutional: Negative for chills, fever, malaise/fatigue and weight loss.  HENT: Negative for congestion, ear discharge, ear pain, nosebleeds, sinus pain, sore throat and tinnitus.   Eyes: Negative.   Respiratory: Negative.  Negative for cough, sputum production and shortness of breath.   Cardiovascular: Negative for chest pain, palpitations, orthopnea, claudication and leg swelling.  Gastrointestinal: Negative for abdominal pain, blood in stool, constipation, diarrhea, heartburn, nausea and vomiting.  Genitourinary: Negative.   Musculoskeletal: Positive for joint pain (knees/hips) and myalgias.  Skin: Negative.   Neurological: Negative for dizziness, tingling, weakness and headaches.  Endo/Heme/Allergies: Negative.   Psychiatric/Behavioral: Negative.     Current treatment- Carbo-Taxol + Avastin + Tecentriq (12/16/17)  No Known Allergies  Past Medical History:  Diagnosis Date  . Cancer ([place]) 12/01/2017   Primary cancer of right upper lobe of lung. Mets to liver, brain.  Marland Kitchen Prepatellar bursitis of left knee    patient denies    Past  Surgical History:  Procedure Laterality Date  . collapsed lung  2012   "had to reinflate" " i carried large piece of sheet rock up stairs by myself "   . [place]   x2 , second was with mesh   . TOTAL  HIP ARTHROPLASTY Right 04/09/2017   Procedure: RIGHT TOTAL HIP ARTHROPLASTY ANTERIOR APPROACH;  Surgeon: Mcarthur Rossetti, MD;  Location: [facility];  Service: Orthopedics;  Laterality: Right;  . URETHRAL STRICTURE DILATATION      Social History   Socioeconomic History  . Marital status: Married    Spouse name: Not on file  . Number of children: Not on file  . Years of education: Not on file  . Highest education level: Not on file  Occupational History  . Not on file  Social Needs  . Financial resource strain: Not on file  . Food insecurity:    Worry: Not on file    Inability: Not on file  . Transportation needs:    Medical: Not on file    Non-medical: Not on file  Tobacco Use  . Smoking status: Former Smoker    Types: Cigarettes    Last attempt to quit: 03/18/2012    Years since quitting: 5.7  . Smokeless tobacco: Never Used  . Tobacco comment: 5 years   Substance and Sexual Activity  . Alcohol use: Yes    Comment: Socially  . Drug use: Yes    Types: Marijuana    Comment: 1 month ago   . Sexual activity: Not Currently  Lifestyle  . Physical activity:    Days per week: Not on file    Minutes per session: Not on file  . Stress: Not on file  Relationships  . Social connections:    Talks on phone: Not on file    Gets together: Not on file    Attends religious service: Not on file    Active member of club or organization: Not on file    Attends meetings of clubs or organizations: Not on file    Relationship status: Not on file  . Intimate partner violence:    Fear of current or ex partner: Not on file    Emotionally abused: Not on file    Physically abused: Not on file    Forced sexual activity: Not on file  Other Topics Concern  . Not on file  Social History Narrative   Father and mother with hypertension.     History reviewed. No pertinent family history.   Current Outpatient Medications:  .  dexamethasone (DECADRON) 4 MG tablet, Take 1 tablet (4 mg  total) by mouth 2 (two) times daily., Disp: 60 tablet, Rfl: 0 .  lisinopril-hydrochlorothiazide (PRINZIDE,ZESTORETIC) 20-12.5 MG tablet, Take 1 tablet by mouth daily., Disp: 30 tablet, Rfl: 0 .  Multiple Vitamin (MULTIVITAMIN WITH MINERALS) TABS tablet, Take 1 tablet by mouth daily., Disp: , Rfl:  .  ondansetron (ZOFRAN) 8 MG tablet, One pill every 8 hours as needed for nausea/vomitting., Disp: 40 tablet, Rfl: 1 .  prochlorperazine (COMPAZINE) 10 MG tablet, Take 1 tablet (10 mg total) by mouth every 6 (six) hours as needed for nausea or vomiting., Disp: 40 tablet, Rfl: 1 .  tiotropium (SPIRIVA HANDIHALER) 18 MCG inhalation capsule, Place 1 capsule (18 mcg total) into inhaler and inhale daily., Disp: 30 capsule, Rfl: 2  Physical exam:  Vitals:   12/24/17 1009 12/24/17 1010  BP:  110/80  Pulse:  (!) 111  Resp:  18  Temp:  (!) 96.9 F (36.1 C)  TempSrc:  Tympanic  SpO2:  97%  Weight: 141 lb 11.2 oz (64.3 kg)    Physical Exam  Constitutional: He is oriented to person, place, and time. He appears well-developed and well-nourished. No distress.  accompanied  HENT:  Head: Normocephalic and atraumatic.  Nose: Nose normal.  Mouth/Throat: Oropharynx is clear and moist. No oropharyngeal exudate.  Eyes: Pupils are equal, round, and reactive to light. Conjunctivae are normal. No scleral icterus.  Neck: Normal range of motion. Neck supple.  Cardiovascular: Regular rhythm and normal heart sounds. Tachycardia present.  Pulmonary/Chest: Effort normal and breath sounds normal. He has no wheezes.  Abdominal: Soft. Bowel sounds are normal. He exhibits no distension. There is no tenderness.  Musculoskeletal: Normal range of motion. He exhibits no edema, tenderness or deformity.  Neurological: He is alert and oriented to person, place, and time.  Skin: Skin is warm and dry.  Psychiatric: He has a normal mood and affect. His behavior is normal.     CMP Latest Ref Rng & Units 12/21/2017  Glucose 70 - 99  mg/dL 102(H)  BUN 8 - 23 mg/dL 23  Creatinine 0.61 - 1.24 mg/dL 0.84  Sodium 135 - 145 mmol/L 134(L)  Potassium 3.5 - 5.1 mmol/L 4.0  Chloride 98 - 111 mmol/L 103  CO2 22 - 32 mmol/L 22  Calcium 8.9 - 10.3 mg/dL 9.4  Total Protein 6.5 - 8.1 g/dL 6.5  Total Bilirubin 0.3 - 1.2 mg/dL 0.7  Alkaline Phos 38 - 126 U/L 66  AST 15 - 41 U/L 31  ALT 0 - 44 U/L 37   CBC Latest Ref Rng & Units 12/24/2017  WBC 3.8 - 10.6 K/uL 3.6(L)  Hemoglobin 13.0 - 18.0 g/dL 13.3  Hematocrit 40.0 - 52.0 % 38.2(L)  Platelets 150 - 440 K/uL 188   No images are attached to the encounter.  Nm Bone Scan Whole Body  Result Date: 12/02/2017 CLINICAL DATA:  Lung carcinoma.  New diagnosis EXAM: NUCLEAR MEDICINE WHOLE BODY BONE SCAN TECHNIQUE: Whole body anterior and posterior images were obtained approximately 3 hours after intravenous injection of radiopharmaceutical. RADIOPHARMACEUTICALS:  23.8 mCi Technetium-30m MDP IV COMPARISON:  CT chest 11/05/2017 FINDINGS: Focal uptake within the posterolateral fifth LEFT rib corresponds to subtle sclerosis sclerotic lesion on comparison CT. Focal uptake within the proximal third of the RIGHT humeral diaphysis consistent metastatic lesion. Focal uptake within the LEFT aspect of the L2 vertebral body is also suspicious for metastasis. This lesion is identified on CT 12/01/2017 IMPRESSION: Skeletal metastasis involving the RIGHT humerus, LEFT fifth rib, and L2 vertebral body. Electronically Signed   By: Suzy Bouchard M.D.   On: 12/02/2017 09:09   Ct Abdomen Pelvis W Contrast  Result Date: 12/01/2017 CLINICAL DATA:  Recently diagnosed with lung cancer and brain metastasis, status post radiation therapy. EXAM: CT ABDOMEN AND PELVIS WITH CONTRAST TECHNIQUE: Multidetector CT imaging of the abdomen and pelvis was performed using the standard protocol following bolus administration of intravenous contrast. CONTRAST:  142mL OMNIPAQUE IOHEXOL 300 MG/ML  SOLN COMPARISON:  Chest CT  11/05/2017. Abdominopelvic CT of 01/28/2016. FINDINGS: Lower chest: Right greater than left patchy airspace disease. These are felt to be similar to 11/05/2017. Normal heart size without pericardial or pleural effusion. Normal, without mass or ductal dilatation. Hepatobiliary: Bilateral low-density liver lesions. These are primarily too small to characterize but felt to be new compared to 01/28/2016. A dominant right hepatic lobe 3.1 x 2.0  cm lesion on 29/2 is new since that exam, consistent with metastatic disease. Immediately anterior inferior 1.4 cm lesion is also new and consistent with metastasis. Normal gallbladder, without biliary ductal dilatation. Pancreas: Normal, without mass or ductal dilatation. Spleen: Normal in size, without focal abnormality. Adrenals/Urinary Tract: Normal adrenal glands. Upper pole right renal cysts. Too small to characterize left renal lesions. No hydronephrosis. Degraded evaluation of the pelvis, secondary to beam hardening artifact from right hip arthroplasty. Grossly normal urinary bladder. Stomach/Bowel: Normal stomach, without wall thickening. Scattered colonic diverticula. Normal terminal ileum and appendix. Normal small bowel. Vascular/Lymphatic: Aortic and branch vessel atherosclerosis. Circumaortic left renal vein. No abdominopelvic adenopathy. Reproductive: Normal prostate. Other: No significant free fluid. No evidence of omental or peritoneal disease. Prior extensive ventral and inguinal hernia repairs. Suspect residual or recurrent tiny fat containing left inguinal hernia. Musculoskeletal: Right hip arthroplasty. Sclerotic lesions within the L2 and L3 vertebral bodies are new since 01/28/2016. Disc bulges at L4-5 and L5-S1. IMPRESSION: 1. Metastatic disease within the liver and bones, as detailed above. 2.  Aortic Atherosclerosis (ICD10-I70.0). Electronically Signed   By: Abigail Miyamoto M.D.   On: 12/01/2017 16:56   Assessment and plan- Patient is a 62 y.o. male with  lung cancer who [facility] for rectal bleeding, myalgias, and hypertension.  1.  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  Currently s/p cycle 1 of carbo-taxol-avastin-tecentriq, given with palliative intent.  Plans to repeat imaging after 2 cycles for restaging.  Continue to follow-up with Dr. Rogue Bussing as scheduled.  2. Rectal Bleeding- no additional episodes since initial evaluation. Has been using otc topicals with good results. Could consider topical hydrocortisone if persistent symptoms. Colonoscopy f/u 01/2021 but recommend sooner if persistent symptoms. Advised patient to notify clinic if additional episodes of bleeding observed.  Encouraged to reduce risk of constipation increase fluid intake.   3. Myalgias- may be related to chemotherapy vs lung ca vs others. Etiology unclear but improved recently. Continued to recommend tylenol, not to exceed 3 g in 24 hours. Declines IV fluids today as he feels well hydrated.  4.  Hypertension- started on lisinopril-hctz 20-12.5 daily at last visit. No prior hx of hptn and suspect symptoms related to avastin. Today, bp decreased & mild dizziness. Labs today reviewed. K 4.7, Na 133, Cr 1.08 (slight increase- continue to monitor). Will decrease bp medication to 1/2 tablet daily but suspect it may need to be adjusted close to administration of chemotherapy. Goal BP discussed and methods of taking BP at home.   rtc on 12/31/17 for repeat BP check and BMP. Follow-up with Dr. Rogue Bussing as scheduled for consideration of cycle 2 of carbo-taxol-avastin-tecentriq.    Visit Diagnosis 1. Malignant neoplasm of lung, unspecified laterality, unspecified part of lung ([place])     Patient expressed understanding and was in agreement with this plan. He also understands that He can call clinic at any time with any questions, concerns, or complaints.   Thank you for allowing me to participate in the  care of this very pleasant patient.   Beckey Rutter, DNP, AGNP-C [place] [facility] [phone] (work cell) [phone] (office)

## 2017-12-28 ENCOUNTER — Telehealth: Payer: Self-pay | Admitting: *Deleted

## 2017-12-28 NOTE — Telephone Encounter (Signed)
James [name], okay to move MD/chemo/labs appt to 9/2. Thx

## 2017-12-28 NOTE — Telephone Encounter (Signed)
Patient is scheduled for cycle #2 chemo on 8/21. Pt and his wife have a music festival they want to go to on 8/29 until 9/1. Since pt did not feel well the first week after chemo, they are wondering if treatment could be moved to the first week of september which would be after the music festival pt and wife want to attend.   Please advise.

## 2017-12-28 NOTE — Telephone Encounter (Signed)
Colette, Dr. B would like to offer [place] as an apt. He has seen him in [place] before.

## 2017-12-31 ENCOUNTER — Inpatient Hospital Stay: Payer: BC Managed Care – PPO

## 2017-12-31 ENCOUNTER — Other Ambulatory Visit: Payer: Self-pay | Admitting: *Deleted

## 2017-12-31 ENCOUNTER — Encounter: Payer: Self-pay | Admitting: *Deleted

## 2017-12-31 DIAGNOSIS — C3411 Malignant neoplasm of upper lobe, right bronchus or lung: Secondary | ICD-10-CM | POA: Diagnosis not present

## 2017-12-31 DIAGNOSIS — C3490 Malignant neoplasm of unspecified part of unspecified bronchus or lung: Secondary | ICD-10-CM

## 2017-12-31 LAB — CBC WITH DIFFERENTIAL/PLATELET
Basophils Absolute: 0.1 10*3/uL (ref 0–0.1)
Basophils Relative: 1 %
EOS ABS: 0 10*3/uL (ref 0–0.7)
EOS PCT: 0 %
HCT: 34.4 % — ABNORMAL LOW (ref 40.0–52.0)
HEMOGLOBIN: 12 g/dL — AB (ref 13.0–18.0)
LYMPHS ABS: 1.7 10*3/uL (ref 1.0–3.6)
Lymphocytes Relative: 23 %
MCH: 33.1 pg (ref 26.0–34.0)
MCHC: 34.9 g/dL (ref 32.0–36.0)
MCV: 94.7 fL (ref 80.0–100.0)
MONOS PCT: 11 %
Monocytes Absolute: 0.8 10*3/uL (ref 0.2–1.0)
Neutro Abs: 5 10*3/uL (ref 1.4–6.5)
Neutrophils Relative %: 65 %
PLATELETS: 248 10*3/uL (ref 150–440)
RBC: 3.63 MIL/uL — ABNORMAL LOW (ref 4.40–5.90)
RDW: 13.4 % (ref 11.5–14.5)
WBC: 7.7 10*3/uL (ref 3.8–10.6)

## 2017-12-31 LAB — BASIC METABOLIC PANEL
Anion gap: 10 (ref 5–15)
BUN: 26 mg/dL — AB (ref 8–23)
CHLORIDE: 102 mmol/L (ref 98–111)
CO2: 22 mmol/L (ref 22–32)
CREATININE: 0.84 mg/dL (ref 0.61–1.24)
Calcium: 9.5 mg/dL (ref 8.9–10.3)
GFR calc Af Amer: >60 mL/min (ref >60)
GFR calc non Af Amer: >60 mL/min (ref >60)
Glucose, Bld: 98 mg/dL (ref 70–99)
Potassium: 4.1 mmol/L (ref 3.5–5.1)
SODIUM: 134 mmol/L — AB (ref 135–145)

## 2017-12-31 MED ORDER — LISINOPRIL-HYDROCHLOROTHIAZIDE 20-12.5 MG PO TABS: 1.0000 | ORAL_TABLET | Freq: Every day | ORAL | 3 refills | Status: DC

## 2017-12-31 NOTE — Telephone Encounter (Signed)
Refill for lisinopril-hctz requested.

## 2017-12-31 NOTE — Patient Instructions (Signed)
Per Dr. Rogue Bussing, pt needs to increase lisinopril-hctz to 1 tablet daily instead of 0.5 tablet. Pt given MD instructions. Informed pt to check BP at home if experiences symptoms of weakness, dizziness, or lightheadedness. Pt verbalized understanding.

## 2018-01-05 ENCOUNTER — Ambulatory Visit: Payer: BC Managed Care – PPO | Admitting: Internal Medicine

## 2018-01-05 ENCOUNTER — Other Ambulatory Visit: Payer: BC Managed Care – PPO

## 2018-01-06 ENCOUNTER — Ambulatory Visit: Payer: BC Managed Care – PPO

## 2018-01-07 ENCOUNTER — Other Ambulatory Visit: Payer: Self-pay

## 2018-01-07 ENCOUNTER — Encounter: Payer: Self-pay | Admitting: Radiation Oncology

## 2018-01-07 ENCOUNTER — Ambulatory Visit
Admission: RE | Admit: 2018-01-07 | Discharge: 2018-01-07 | Disposition: A | Discharge status: Home / Self Care | Payer: BC Managed Care – PPO | Source: Ambulatory Visit | Attending: Radiation Oncology | Admitting: Radiation Oncology

## 2018-01-07 VITALS — BP 116/85 | HR 108 | Temp 97.5°F | Resp 18 | Wt 145.2 lb

## 2018-01-07 DIAGNOSIS — C3490 Malignant neoplasm of unspecified part of unspecified bronchus or lung: Secondary | ICD-10-CM

## 2018-01-07 DIAGNOSIS — C7931 Secondary malignant neoplasm of brain: Principal | ICD-10-CM

## 2018-01-07 NOTE — Progress Notes (Signed)
Radiation Oncology Follow up Note  Name: James [name]   Date:   01/07/2018 MRN:  [id number] DOB: 06-Apr-1956    This 62 y.o. male presents to the clinic today for one-month follow-up status post hypofractionated radiation therapy for solitary brain metastasis.f the right occipital lobe  REFERRING PROVIDER: Valerie Roys, DO  HPI: patient is a.62 year old malewith stage IV adenocarcinoma the lung now 1 month out from hypofractionated course of radiation for a solitary brain metastasis. He is currently oncarbotaxol plus Avastin plus Tecentriq . He does have known liver metastasis also. From a neurologic standpoint he is doing well did have some atypical type headaches in the posterior skull shortly after radiation although is those have subsided. He is tolerating his chemotherapy well at this time. He seems specifically denies any change in visual fields any focal neurologic deficits or headaches.  COMPLICATIONS OF TREATMENT: none  FOLLOW UP COMPLIANCE: keeps appointments   PHYSICAL EXAM:  BP 116/85 (BP Location: Left Arm, Patient Position: Sitting)   Pulse (!) 108   Temp (!) 97.5 F (36.4 C) (Tympanic)   Resp 18   Wt 145 lb 2.8 oz (65.9 kg)   BMI 22.07 kg/m  Well-developed well-nourished patient in NAD. HEENT reveals PERLA, EOMI, discs not visualized.  Oral cavity is clear. No oral mucosal lesions are identified. Neck is clear without evidence of cervical or supraclavicular adenopathy. Lungs are clear to A&P. Cardiac examination is essentially unremarkable with regular rate and rhythm without murmur rub or thrill. Abdomen is benign with no organomegaly or masses noted. Motor sensory and DTR levels are equal and symmetric in the upper and lower extremities. Cranial nerves II through XII are grossly intact. Proprioception is intact. No peripheral adenopathy or edema is identified. No motor or sensory levels are noted. Crude visual fields are within normal range.  RADIOLOGY RESULTS: no current  films for review  PLAN: at the present time patient is doing well currently on therapy as described above by medical oncology. I will turn follow-up care over to medical oncology. I would be happy to reevaluate the patient any time should further palliative treatment be indicated. Patient wife both seem to comprehend my treatment plan well.  I would like to take this opportunity to thank you for allowing me to participate in the care of your patient.Noreene Filbert, MD

## 2018-01-10 ENCOUNTER — Telehealth: Payer: Self-pay | Admitting: *Deleted

## 2018-01-10 NOTE — Telephone Encounter (Signed)
Pt's wife called in to report patient has been experiening worsening fatigue and decreased appetite and has asked if patient could get steroids to help with symptoms. Informed pt's wife that steroids are usually not combined with immunotherapy and patient may need appetite stimulant instead. Informed that would discuss with MD for further recommendations.   Please advise.

## 2018-01-11 ENCOUNTER — Other Ambulatory Visit: Payer: Self-pay | Admitting: Internal Medicine

## 2018-01-11 MED ORDER — MEGESTROL ACETATE 625 MG/5ML PO SUSP: 625.0000 mg | Freq: Every day | ORAL | 0 refills | Status: DC

## 2018-01-11 NOTE — Progress Notes (Signed)
Pt's wife has been made aware.

## 2018-01-11 NOTE — Progress Notes (Signed)
hayley-I have sent a prescription for Megace to his pharmacy; please check with the patient/wife if that is reasonable.  As you mention-I would not want to start on steroids unless absolutely necessary.

## 2018-01-11 NOTE — Telephone Encounter (Signed)
Pt's wife called back this morning with an update that pt has started to feel slightly better with fatigue but pt's wife would still like to know Dr. Sharmaine Base recommendation about an appetite stimulant. Please advise.

## 2018-01-12 ENCOUNTER — Other Ambulatory Visit: Payer: Self-pay | Admitting: *Deleted

## 2018-01-12 DIAGNOSIS — R918 Other nonspecific abnormal finding of lung field: Secondary | ICD-10-CM

## 2018-01-18 ENCOUNTER — Inpatient Hospital Stay (HOSPITAL_BASED_OUTPATIENT_CLINIC_OR_DEPARTMENT_OTHER): Payer: BC Managed Care – PPO | Admitting: Internal Medicine

## 2018-01-18 ENCOUNTER — Encounter: Payer: Self-pay | Admitting: Internal Medicine

## 2018-01-18 ENCOUNTER — Other Ambulatory Visit: Payer: Self-pay

## 2018-01-18 ENCOUNTER — Inpatient Hospital Stay: Payer: BC Managed Care – PPO | Attending: Internal Medicine

## 2018-01-18 VITALS — BP 127/89 | HR 91 | Temp 97.9°F | Resp 18 | Ht 68.0 in | Wt 147.0 lb

## 2018-01-18 DIAGNOSIS — Z5111 Encounter for antineoplastic chemotherapy: Secondary | ICD-10-CM | POA: Diagnosis not present

## 2018-01-18 DIAGNOSIS — C787 Secondary malignant neoplasm of liver and intrahepatic bile duct: Secondary | ICD-10-CM | POA: Insufficient documentation

## 2018-01-18 DIAGNOSIS — R918 Other nonspecific abnormal finding of lung field: Secondary | ICD-10-CM

## 2018-01-18 DIAGNOSIS — C782 Secondary malignant neoplasm of pleura: Secondary | ICD-10-CM

## 2018-01-18 DIAGNOSIS — C3411 Malignant neoplasm of upper lobe, right bronchus or lung: Secondary | ICD-10-CM | POA: Insufficient documentation

## 2018-01-18 DIAGNOSIS — Z79899 Other long term (current) drug therapy: Secondary | ICD-10-CM | POA: Insufficient documentation

## 2018-01-18 DIAGNOSIS — C7931 Secondary malignant neoplasm of brain: Secondary | ICD-10-CM | POA: Diagnosis not present

## 2018-01-18 DIAGNOSIS — C7951 Secondary malignant neoplasm of bone: Secondary | ICD-10-CM | POA: Insufficient documentation

## 2018-01-18 DIAGNOSIS — Z87891 Personal history of nicotine dependence: Secondary | ICD-10-CM | POA: Insufficient documentation

## 2018-01-18 LAB — CBC WITH DIFFERENTIAL/PLATELET
BASOS ABS: 0.1 10*3/uL (ref 0–0.1)
Basophils Relative: 1 %
Eosinophils Absolute: 0.3 10*3/uL (ref 0–0.7)
Eosinophils Relative: 5 %
HCT: 31.5 % — ABNORMAL LOW (ref 40.0–52.0)
Hemoglobin: 10.8 g/dL — ABNORMAL LOW (ref 13.0–18.0)
LYMPHS ABS: 1 10*3/uL (ref 1.0–3.6)
LYMPHS PCT: 18 %
MCH: 33.4 pg (ref 26.0–34.0)
MCHC: 34.4 g/dL (ref 32.0–36.0)
MCV: 97.1 fL (ref 80.0–100.0)
MONO ABS: 0.5 10*3/uL (ref 0.2–1.0)
Monocytes Relative: 9 %
NEUTROS ABS: 3.8 10*3/uL (ref 1.4–6.5)
Neutrophils Relative %: 67 %
Platelets: 216 10*3/uL (ref 150–440)
RBC: 3.24 MIL/uL — ABNORMAL LOW (ref 4.40–5.90)
RDW: 15.1 % — ABNORMAL HIGH (ref 11.5–14.5)
WBC: 5.6 10*3/uL (ref 3.8–10.6)

## 2018-01-18 LAB — COMPREHENSIVE METABOLIC PANEL
ALBUMIN: 3.5 g/dL (ref 3.5–5.0)
ALT: 17 U/L (ref 0–44)
ANION GAP: 10 (ref 5–15)
AST: 23 U/L (ref 15–41)
Alkaline Phosphatase: 57 U/L (ref 38–126)
BILIRUBIN TOTAL: 0.6 mg/dL (ref 0.3–1.2)
BUN: 12 mg/dL (ref 8–23)
CHLORIDE: 106 mmol/L (ref 98–111)
CO2: 23 mmol/L (ref 22–32)
Calcium: 8.9 mg/dL (ref 8.9–10.3)
Creatinine, Ser: 0.93 mg/dL (ref 0.61–1.24)
GFR calc Af Amer: >60 mL/min (ref >60)
GFR calc non Af Amer: >60 mL/min (ref >60)
GLUCOSE: 100 mg/dL — AB (ref 70–99)
POTASSIUM: 4.5 mmol/L (ref 3.5–5.1)
Sodium: 139 mmol/L (ref 135–145)
TOTAL PROTEIN: 6.7 g/dL (ref 6.5–8.1)

## 2018-01-18 NOTE — Progress Notes (Signed)
[facility] OFFICE PROGRESS NOTE  Patient Care Team: Valerie Roys, DO as PCP - General (Family Medicine) Telford Nab, RN as Registered Nurse Rogue Bussing, Elisha Headland, MD as Medical Oncologist (Medical Oncology)  Cancer Staging No matching staging information was found for the patient.   Oncology History   # July 2019- [facility] CA- RUL [s/p Liver Bx]-CT chest- RUL/ liver/ brain solitary lesion.  to contralateral lung; pleura; liver and brain.July 2019- A/P- liver metastases at least 2; bone scan shows right humeral; left fifth rib; L2 vertebral body metastases.  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     12/09/2017 -  Chemotherapy    The patient had palonosetron (ALOXI) injection 0.25 mg, 0.25 mg, Intravenous,  Once, 1 of 4 cycles Administration: 0.25 mg (12/16/2017) CARBOplatin (PARAPLATIN) 670 mg in sodium chloride 0.9 % 250 mL chemo infusion, 670 mg (100 % of original dose 667.8 mg), Intravenous,  Once, 1 of 4 cycles Dose modification:   (original dose 667.8 mg, Cycle 1, Reason: Provider Judgment) Administration: 670 mg (12/16/2017) PACLitaxel (TAXOL) 360 mg in sodium chloride 0.9 % 500 mL chemo infusion (> 17m/m2), 200 mg/m2 = 360 mg, Intravenous,  Once, 1 of 4 cycles Administration: 360 mg (12/16/2017) atezolizumab (TECENTRIQ) 1,200 mg in sodium chloride 0.9 % 250 mL chemo infusion, 1,200 mg,  Intravenous, Once, 1 of 4 cycles Administration: 1,200 mg (12/16/2017)  for chemotherapy treatment.        INTERVAL HISTORY:  James Flessner89y.o.  male of a history of metastatic adenocarcinoma the lung currently status post cycle #1 carbotaxol Avastin plus Tecentriq is here for follow-up  Patient had episode of nausea 3 to 4 days post chemotherapy.  Also had extreme fatigue.  Poor appetite.  Also joint pains.  Currently improved.   Denies any tingling or numbness.  Denies any pain.  Review of Systems  Constitutional: Positive for malaise/fatigue. Negative for chills, diaphoresis, fever and weight loss.  HENT: Negative for nosebleeds and sore throat.   Eyes: Negative for double vision.  Respiratory: Positive for cough and shortness of breath. Negative for hemoptysis, sputum production and wheezing.   Cardiovascular: Negative for chest pain, palpitations, orthopnea and leg swelling.  Gastrointestinal: Positive for nausea. Negative for abdominal pain, blood in stool, constipation, diarrhea, heartburn, melena and vomiting.  Genitourinary: Negative for dysuria, frequency and urgency.  Musculoskeletal: Negative for back pain and joint pain.  Skin: Negative.  Negative for itching and rash.  Neurological: Negative for dizziness, tingling, focal weakness, weakness and headaches.  Endo/Heme/Allergies: Does not bruise/bleed easily.  Psychiatric/Behavioral: Negative for depression. The patient is not nervous/anxious and does not have insomnia.       PAST MEDICAL HISTORY :  Past Medical History:  Diagnosis Date  . Cancer ([facility] 12/01/2017   Primary cancer of right upper lobe of lung. Mets to liver, brain.  .Marland KitchenPrepatellar bursitis of left knee    patient denies    PAST SURGICAL  HISTORY :   Past Surgical History:  Procedure Laterality Date  . collapsed lung  2012   "had to reinflate" " i carried large piece of sheet rock up stairs by myself "   . [place]   x2 , second  was with mesh   . TOTAL HIP ARTHROPLASTY Right 04/09/2017   Procedure: RIGHT TOTAL HIP ARTHROPLASTY ANTERIOR APPROACH;  Surgeon: Mcarthur Rossetti, MD;  Location: [facility];  Service: Orthopedics;  Laterality: Right;  . URETHRAL STRICTURE DILATATION      FAMILY HISTORY :  History reviewed. No pertinent family history.  SOCIAL HISTORY:   Social History   Tobacco Use  . Smoking status: Former Smoker    Types: Cigarettes    Last attempt to quit: 03/18/2012    Years since quitting: 5.8  . Smokeless tobacco: Never Used  . Tobacco comment: 5 years   Substance Use Topics  . Alcohol use: Yes    Comment: Socially  . Drug use: Yes    Types: Marijuana    Comment: 1 month ago     ALLERGIES:  has No Known Allergies.  MEDICATIONS:  Current Outpatient Medications  Medication Sig Dispense Refill  . Multiple Vitamin (MULTIVITAMIN WITH MINERALS) TABS tablet Take 1 tablet by mouth daily.    . ondansetron (ZOFRAN) 8 MG tablet One pill every 8 hours as needed for nausea/vomitting. 40 tablet 1  . prochlorperazine (COMPAZINE) 10 MG tablet Take 1 tablet (10 mg total) by mouth every 6 (six) hours as needed for nausea or vomiting. 40 tablet 1  . tiotropium (SPIRIVA HANDIHALER) 18 MCG inhalation capsule Place 1 capsule (18 mcg total) into inhaler and inhale daily. 30 capsule 2  . lisinopril-hydrochlorothiazide (PRINZIDE,ZESTORETIC) 20-12.5 MG tablet Take 1 tablet by mouth daily. (Patient not taking: Reported on 01/18/2018) 30 tablet 3  . megestrol (MEGACE ES) 625 MG/5ML suspension Take 5 mLs (625 mg total) by mouth daily. (Patient not taking: Reported on 01/18/2018) 150 mL 0   No current facility-administered medications for this visit.     PHYSICAL EXAMINATION: ECOG PERFORMANCE STATUS: 1 - Symptomatic but completely ambulatory  BP 127/89 (BP Location: Left Arm, Patient Position: Sitting)   Pulse 91   Temp 97.9 F (36.6 C) (Tympanic)   Resp 18   Ht 5' 8" (1.727 m)   Wt 147 lb 0.8 oz (66.7 kg)    BMI 22.36 kg/m   Filed Weights   01/18/18 0945  Weight: 147 lb 0.8 oz (66.7 kg)    Physical Exam  Constitutional: He is oriented to person, place, and time and well-developed, well-nourished, and in no distress.  Accompanied by his wife.  HENT:  Head: Normocephalic and atraumatic.  Mouth/Throat: Oropharynx is clear and moist. No oropharyngeal exudate.  Eyes: Pupils are equal, round, and reactive to light.  Neck: Normal range of motion. Neck supple.  Cardiovascular: Normal rate and regular rhythm.  Pulmonary/Chest: No respiratory distress. He has no wheezes.  Decreased air entry bilaterally.  Abdominal: Soft. Bowel sounds are normal. He exhibits no distension and no mass. There is no tenderness. There is no rebound and no guarding.  Musculoskeletal: Normal range of motion. He exhibits no edema or tenderness.  Neurological: He is alert and oriented to person, place, and time.  Skin: Skin is warm.  Psychiatric: Affect normal.       LABORATORY DATA:  I have reviewed the data as listed    Component Value Date/Time   NA 139 01/18/2018 0932  NA 138 08/24/2017 1651   NA 137 04/23/2013 1330   K 4.5 01/18/2018 0932   K 3.8 04/23/2013 1330   CL 106 01/18/2018 0932   CL 105 04/23/2013 1330   CO2 23 01/18/2018 0932   CO2 22 04/23/2013 1330   GLUCOSE 100 (H) 01/18/2018 0932   GLUCOSE 120 (H) 04/23/2013 1330   BUN 12 01/18/2018 0932   BUN 18 08/24/2017 1651   BUN 18 04/23/2013 1330   CREATININE 0.93 01/18/2018 0932   CREATININE 0.90 04/23/2013 1330   CALCIUM 8.9 01/18/2018 0932   CALCIUM 9.3 04/23/2013 1330   PROT 6.7 01/18/2018 0932   PROT 6.8 08/24/2017 1651   PROT 7.5 04/23/2013 1330   ALBUMIN 3.5 01/18/2018 0932   ALBUMIN 4.2 08/24/2017 1651   ALBUMIN 3.8 04/23/2013 1330   AST 23 01/18/2018 0932   AST 11 (L) 04/23/2013 1330   ALT 17 01/18/2018 0932   ALT 18 04/23/2013 1330   ALKPHOS 57 01/18/2018 0932   ALKPHOS 64 04/23/2013 1330   BILITOT 0.6 01/18/2018 0932    BILITOT 0.2 08/24/2017 1651   BILITOT 0.6 04/23/2013 1330   GFRNONAA >60 01/18/2018 0932   GFRNONAA >60 04/23/2013 1330   GFRAA >60 01/18/2018 0932   GFRAA >60 04/23/2013 1330    No results found for: SPEP, UPEP  Lab Results  Component Value Date   WBC 5.6 01/18/2018   NEUTROABS 3.8 01/18/2018   HGB 10.8 (L) 01/18/2018   HCT 31.5 (L) 01/18/2018   MCV 97.1 01/18/2018   PLT 216 01/18/2018      Chemistry      Component Value Date/Time   NA 139 01/18/2018 0932   NA 138 08/24/2017 1651   NA 137 04/23/2013 1330   K 4.5 01/18/2018 0932   K 3.8 04/23/2013 1330   CL 106 01/18/2018 0932   CL 105 04/23/2013 1330   CO2 23 01/18/2018 0932   CO2 22 04/23/2013 1330   BUN 12 01/18/2018 0932   BUN 18 08/24/2017 1651   BUN 18 04/23/2013 1330   CREATININE 0.93 01/18/2018 0932   CREATININE 0.90 04/23/2013 1330      Component Value Date/Time   CALCIUM 8.9 01/18/2018 0932   CALCIUM 9.3 04/23/2013 1330   ALKPHOS 57 01/18/2018 0932   ALKPHOS 64 04/23/2013 1330   AST 23 01/18/2018 0932   AST 11 (L) 04/23/2013 1330   ALT 17 01/18/2018 0932   ALT 18 04/23/2013 1330   BILITOT 0.6 01/18/2018 0932   BILITOT 0.2 08/24/2017 1651   BILITOT 0.6 04/23/2013 1330       RADIOGRAPHIC STUDIES: I have personally reviewed the radiological images as listed and agreed with the findings in the report. No results found.   ASSESSMENT & PLAN:  Primary cancer of right upper lobe of lung ([place]) #Lung adenocarcinoma-stage IV-metastases- STABLE. S/p carbotaxol plus Avastin plus Tecentriq cycle #1.#Tolerated chemotherapy with expected side effects of fatigue/mild nausea.  #Proceed with cycle #2-carbotaxol-Tec-Avastin #2. Labs today reviewed;  acceptable for treatment today.   # joint pain-question second to chemotherapy; current resolved.    #Labile blood pressures continue HNT medication. Hold if less than 100.  Stable  #Bone mets- X-geva at next visit.  Discussed the potential benefits and side  effects of hypocalcemia and osteonecrosis of the jaw.  Recommend calcium vitamin D.  Stable  #Loss of appetite/fatigue-secondary to chemotherapy; currently improved.  Okay to hold off Megace at this time.  #Solitary brain met- s/p RT- 7/17; stable will plan MRI  in 1 month or so.   # 10 days- cbc/hold tube/ possible pRBC; in 3 weeks;Carbo-taxol-tecen-avastin/ MD; CT scan prior      Orders Placed This Encounter  Procedures  . CT CHEST W CONTRAST    Standing Status:   Future    Standing Expiration Date:   01/19/2019    Order Specific Question:   If indicated for the ordered procedure, I authorize the administration of contrast media per Radiology protocol    Answer:   Yes    Order Specific Question:   Preferred imaging location?    Answer:   [facility]    Order Specific Question:   Radiology Contrast Protocol - do NOT remove file path    Answer:   _0 [email or web address]    Order Specific Question:   ** REASON FOR EXAM (FREE TEXT)    Answer:   lung cancer on chemo  . CT Abdomen Pelvis W Contrast    Standing Status:   Future    Standing Expiration Date:   01/18/2019    Order Specific Question:   ** REASON FOR EXAM (FREE TEXT)    Answer:   lung cancer on chemo    Order Specific Question:   If indicated for the ordered procedure, I authorize the administration of contrast media per Radiology protocol    Answer:   Yes    Order Specific Question:   Preferred imaging location?    Answer:   [facility]    Order Specific Question:   Is Oral Contrast requested for this exam?    Answer:   Yes, Per Radiology protocol    Order Specific Question:   Radiology Contrast Protocol - do NOT remove file path    Answer:   _1 [email or web address]  . CBC with Differential/Platelet    Standing Status:   Future    Standing Expiration Date:   01/19/2019  . Comprehensive metabolic panel    Standing Status:   Future    Standing Expiration Date:   01/19/2019   . CBC with Differential/Platelet    Standing Status:   Future    Standing Expiration Date:   01/19/2019  . Comprehensive metabolic panel    Standing Status:   Future    Standing Expiration Date:   01/19/2019  . Sample to Blood Bank    Standing Status:   Future    Standing Expiration Date:   01/19/2019  . Sample to Blood Bank    Standing Status:   Future    Standing Expiration Date:   01/19/2019   All questions were answered. The patient knows to call the clinic with any problems, questions or concerns.      Cammie Sickle, MD 01/18/2018 12:39 PM

## 2018-01-18 NOTE — Assessment & Plan Note (Addendum)
#  Lung adenocarcinoma-stage IV-metastases- STABLE. S/p carbotaxol plus Avastin plus Tecentriq cycle #1.#Tolerated chemotherapy with expected side effects of fatigue/mild nausea.  #Proceed with cycle #2-carbotaxol-Tec-Avastin #2. Labs today reviewed;  acceptable for treatment today.   # joint pain-question second to chemotherapy; current resolved.    #Labile blood pressures continue HNT medication. Hold if less than 100.  Stable  #Bone mets- X-geva at next visit.  Discussed the potential benefits and side effects of hypocalcemia and osteonecrosis of the jaw.  Recommend calcium vitamin D.  Stable  #Loss of appetite/fatigue-secondary to chemotherapy; currently improved.  Okay to hold off Megace at this time.  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   # 10 days- cbc/hold tube/ possible pRBC; in 3 weeks;Carbo-taxol-tecen-avastin/ MD; CT scan prior

## 2018-01-19 ENCOUNTER — Other Ambulatory Visit: Payer: Self-pay | Admitting: *Deleted

## 2018-01-19 ENCOUNTER — Inpatient Hospital Stay: Payer: BC Managed Care – PPO

## 2018-01-19 VITALS — BP 116/81 | HR 99 | Temp 96.9°F | Resp 18

## 2018-01-19 DIAGNOSIS — R918 Other nonspecific abnormal finding of lung field: Secondary | ICD-10-CM

## 2018-01-19 DIAGNOSIS — C3411 Malignant neoplasm of upper lobe, right bronchus or lung: Secondary | ICD-10-CM

## 2018-01-19 LAB — PROTEIN, URINE, RANDOM

## 2018-01-19 MED ORDER — SODIUM CHLORIDE 0.9 % IV SOLN
1200.0000 mg | Freq: Once | INTRAVENOUS | Status: AC
  Administered 2018-01-19: 1200 mg via INTRAVENOUS
  Filled 2018-01-19: qty 20

## 2018-01-19 MED ORDER — PALONOSETRON HCL INJECTION 0.25 MG/5ML
0.2500 mg | Freq: Once | INTRAVENOUS | Status: AC
  Administered 2018-01-19: 0.25 mg via INTRAVENOUS
  Filled 2018-01-19: qty 5

## 2018-01-19 MED ORDER — SODIUM CHLORIDE 0.9 % IV SOLN
20.0000 mg | Freq: Once | INTRAVENOUS | Status: AC
  Administered 2018-01-19: 20 mg via INTRAVENOUS
  Filled 2018-01-19: qty 2

## 2018-01-19 MED ORDER — SODIUM CHLORIDE 0.9 % IV SOLN
610.0000 mg | Freq: Once | INTRAVENOUS | Status: AC
  Administered 2018-01-19: 610 mg via INTRAVENOUS
  Filled 2018-01-19: qty 61

## 2018-01-19 MED ORDER — FAMOTIDINE IN NACL 20-0.9 MG/50ML-% IV SOLN
20.0000 mg | Freq: Once | INTRAVENOUS | Status: AC
  Administered 2018-01-19: 20 mg via INTRAVENOUS
  Filled 2018-01-19: qty 50

## 2018-01-19 MED ORDER — DIPHENHYDRAMINE HCL 50 MG/ML IJ SOLN
50.0000 mg | Freq: Once | INTRAMUSCULAR | Status: AC
  Administered 2018-01-19: 50 mg via INTRAVENOUS
  Filled 2018-01-19: qty 1

## 2018-01-19 MED ORDER — SODIUM CHLORIDE 0.9 % IV SOLN
15.0000 mg/kg | Freq: Once | INTRAVENOUS | Status: AC
  Administered 2018-01-19: 1000 mg via INTRAVENOUS
  Filled 2018-01-19: qty 32

## 2018-01-19 MED ORDER — SODIUM CHLORIDE 0.9 % IV SOLN
200.0000 mg/m2 | Freq: Once | INTRAVENOUS | Status: AC
  Administered 2018-01-19: 360 mg via INTRAVENOUS
  Filled 2018-01-19: qty 60

## 2018-01-19 MED ORDER — SODIUM CHLORIDE 0.9 % IV SOLN
Freq: Once | INTRAVENOUS | Status: AC
  Administered 2018-01-19: 09:00:00 via INTRAVENOUS
  Filled 2018-01-19: qty 250

## 2018-01-19 NOTE — Progress Notes (Signed)
Last TSH 08/10/16.  Notified MD, will order a TSH level for next visit.

## 2018-01-24 ENCOUNTER — Telehealth: Payer: Self-pay

## 2018-01-24 NOTE — Telephone Encounter (Signed)
Pt would like to schedule PFT.

## 2018-01-24 NOTE — Telephone Encounter (Signed)
Returned call to patient and gave scheduling contact information. PFT was scheduled 9/11/@4  pm but was cancelled by someone. Spouse states this was not supposed to be cancelled.

## 2018-01-26 ENCOUNTER — Encounter: Payer: BC Managed Care – PPO | Admitting: Internal Medicine

## 2018-01-27 ENCOUNTER — Other Ambulatory Visit: Payer: BC Managed Care – PPO

## 2018-01-28 ENCOUNTER — Inpatient Hospital Stay: Payer: BC Managed Care – PPO

## 2018-01-28 ENCOUNTER — Ambulatory Visit: Payer: BC Managed Care – PPO

## 2018-01-28 DIAGNOSIS — C3411 Malignant neoplasm of upper lobe, right bronchus or lung: Secondary | ICD-10-CM

## 2018-01-28 LAB — CBC WITH DIFFERENTIAL/PLATELET
BASOS ABS: 0 10*3/uL (ref 0–0.1)
Basophils Relative: 2 %
EOS ABS: 0.1 10*3/uL (ref 0–0.7)
EOS PCT: 4 %
HCT: 29.7 % — ABNORMAL LOW (ref 40.0–52.0)
Hemoglobin: 10.6 g/dL — ABNORMAL LOW (ref 13.0–18.0)
LYMPHS PCT: 31 %
Lymphs Abs: 0.8 10*3/uL — ABNORMAL LOW (ref 1.0–3.6)
MCH: 34.1 pg — AB (ref 26.0–34.0)
MCHC: 35.6 g/dL (ref 32.0–36.0)
MCV: 95.8 fL (ref 80.0–100.0)
Monocytes Absolute: 0.2 10*3/uL (ref 0.2–1.0)
Monocytes Relative: 7 %
Neutro Abs: 1.4 10*3/uL (ref 1.4–6.5)
Neutrophils Relative %: 56 %
PLATELETS: 210 10*3/uL (ref 150–440)
RBC: 3.1 MIL/uL — AB (ref 4.40–5.90)
RDW: 15.8 % — AB (ref 11.5–14.5)
WBC: 2.5 10*3/uL — AB (ref 3.8–10.6)

## 2018-01-28 LAB — COMPREHENSIVE METABOLIC PANEL
ALBUMIN: 4 g/dL (ref 3.5–5.0)
ALK PHOS: 62 U/L (ref 38–126)
ALT: 25 U/L (ref 0–44)
AST: 26 U/L (ref 15–41)
Anion gap: 10 (ref 5–15)
BILIRUBIN TOTAL: 0.5 mg/dL (ref 0.3–1.2)
BUN: 19 mg/dL (ref 8–23)
CO2: 24 mmol/L (ref 22–32)
CREATININE: 0.9 mg/dL (ref 0.61–1.24)
Calcium: 9.4 mg/dL (ref 8.9–10.3)
Chloride: 104 mmol/L (ref 98–111)
GFR calc Af Amer: >60 mL/min (ref >60)
GLUCOSE: 120 mg/dL — AB (ref 70–99)
Potassium: 4.3 mmol/L (ref 3.5–5.1)
Sodium: 138 mmol/L (ref 135–145)
TOTAL PROTEIN: 7.1 g/dL (ref 6.5–8.1)

## 2018-01-28 LAB — SAMPLE TO BLOOD BANK

## 2018-01-28 NOTE — Progress Notes (Signed)
No blood today, labs verified with Dr B.

## 2018-01-31 ENCOUNTER — Other Ambulatory Visit: Payer: Self-pay | Admitting: Internal Medicine

## 2018-02-03 ENCOUNTER — Ambulatory Visit (INDEPENDENT_AMBULATORY_CARE_PROVIDER_SITE_OTHER): Payer: BC Managed Care – PPO | Admitting: Orthopaedic Surgery

## 2018-02-03 ENCOUNTER — Ambulatory Visit: Payer: BC Managed Care – PPO | Attending: Internal Medicine

## 2018-02-03 DIAGNOSIS — R918 Other nonspecific abnormal finding of lung field: Secondary | ICD-10-CM | POA: Diagnosis not present

## 2018-02-03 DIAGNOSIS — J441 Chronic obstructive pulmonary disease with (acute) exacerbation: Secondary | ICD-10-CM | POA: Diagnosis not present

## 2018-02-03 MED ORDER — ALBUTEROL SULFATE (2.5 MG/3ML) 0.083% IN NEBU
2.5000 mg | INHALATION_SOLUTION | Freq: Once | RESPIRATORY_TRACT | Status: AC
  Administered 2018-02-03: 2.5 mg via RESPIRATORY_TRACT
  Filled 2018-02-03: qty 3

## 2018-02-04 ENCOUNTER — Ambulatory Visit
Admission: RE | Admit: 2018-02-04 | Discharge: 2018-02-04 | Disposition: A | Discharge status: Home / Self Care | Payer: BC Managed Care – PPO | Source: Ambulatory Visit | Attending: Internal Medicine | Admitting: Internal Medicine

## 2018-02-04 DIAGNOSIS — C787 Secondary malignant neoplasm of liver and intrahepatic bile duct: Secondary | ICD-10-CM | POA: Diagnosis not present

## 2018-02-04 DIAGNOSIS — C7951 Secondary malignant neoplasm of bone: Secondary | ICD-10-CM | POA: Insufficient documentation

## 2018-02-04 DIAGNOSIS — C771 Secondary and unspecified malignant neoplasm of intrathoracic lymph nodes: Secondary | ICD-10-CM | POA: Insufficient documentation

## 2018-02-04 DIAGNOSIS — R918 Other nonspecific abnormal finding of lung field: Secondary | ICD-10-CM | POA: Insufficient documentation

## 2018-02-04 DIAGNOSIS — C3411 Malignant neoplasm of upper lobe, right bronchus or lung: Secondary | ICD-10-CM | POA: Insufficient documentation

## 2018-02-04 MED ORDER — IOPAMIDOL (ISOVUE-300) INJECTION 61%
100.0000 mL | Freq: Once | INTRAVENOUS | Status: AC | PRN
  Administered 2018-02-04: 100 mL via INTRAVENOUS

## 2018-02-07 ENCOUNTER — Ambulatory Visit (INDEPENDENT_AMBULATORY_CARE_PROVIDER_SITE_OTHER): Payer: BC Managed Care – PPO | Admitting: Internal Medicine

## 2018-02-07 ENCOUNTER — Encounter: Payer: Self-pay | Admitting: Internal Medicine

## 2018-02-07 VITALS — BP 102/78 | HR 110 | Ht 68.0 in | Wt 143.0 lb

## 2018-02-07 DIAGNOSIS — J449 Chronic obstructive pulmonary disease, unspecified: Secondary | ICD-10-CM

## 2018-02-07 MED ORDER — GLYCOPYRROLATE-FORMOTEROL 9-4.8 MCG/ACT IN AERO: 2.0000 | INHALATION_SPRAY | Freq: Every day | RESPIRATORY_TRACT | 5 refills | Status: DC

## 2018-02-07 MED ORDER — ALBUTEROL SULFATE HFA 108 (90 BASE) MCG/ACT IN AERS: 1.0000 | INHALATION_SPRAY | Freq: Four times a day (QID) | RESPIRATORY_TRACT | 5 refills | Status: AC | PRN

## 2018-02-07 NOTE — Patient Instructions (Addendum)
Start Bevespi 2 puffs once daily, after finishing your spiriva (don't take them together).  Use albuterol (rescue inhaler) 2 puffs every 4 hours as needed.

## 2018-02-07 NOTE — Progress Notes (Signed)
[facility] Consultation      Assessment and Plan:   COPD with emphysema. Dyspnea on exertion, secondary to COPD/emphysema. --FEV1=48%. Discussed changing spiriva to a combo LAMA/LABA inhaler.  --He was given a Bevespi inhaler coupon  Stage IV metastatic lung cancer. - Continue follow up with oncology.   Meds ordered this encounter  Medications  . Glycopyrrolate-Formoterol (BEVESPI AEROSPHERE) 9-4.8 MCG/ACT AERO    Sig: Inhale 2 puffs into the lungs daily.    Dispense:  1 Inhaler    Refill:  5  . albuterol (PROAIR HFA) 108 (90 Base) MCG/ACT inhaler    Sig: Inhale 1-2 puffs into the lungs every 6 (six) hours as needed for wheezing or shortness of breath.    Dispense:  1 Inhaler    Refill:  5     Return in about 4 months (around 06/09/2018).     Date: 02/07/2018  MRN# [id number] James [name] [birth year]   James [name] is a 62 y.o. old male seen in consultation for chief complaint of:    Chief Complaint  Patient presents with  . Follow-up    PFT results: SOB w/activity: cough in mornings:     HPI:  The patient is 62 year old male with lung cancer with metastatic disease to liver and brain.  He was diagnosed by needle biopsy of the liver on 11/17/2017, which was consistent with metastatic lung cancer, he also had evidence of brain metastases. He feels that his breathing has been doing well, he has a bit of am sputum production. He can mow the lawn with a push mower but will have trouble with it.  He is using spiriva once daily, which helps.     **PFT 02/03/2018>> PFT tracings personally reviewed.  FVC is 72% predicted, FEV1 is 46% predicted, ratio 50%.  There is a 12% improvement in FVC, 10% improvement in FEV1.  TLC is 92% predicted.  RV to TLC ratio 144% overall this test shows findings of severe COPD with hyperinflation and air trapping consistent with a diagnosis of severe COPD. **CT chest 02/04/2018>> images personally reviewed, in comparison with  previous on 11/05/2017, there has been interval slight improvement since the previous CAT scan.  **CT chest 11/05/2017>> apical bullous emphysema, bilateral upper lobe scarring/nodular changes, which are greatest in the right upper lobe; bibasilar infiltrates with areas of bronchiectasis.  There is left paratracheal lymphadenopathy, right hilar lymphadenopathy, and right infrahilar lymphadenopathy medial to the right bronchus intermedius **Absolute eosinophil count 08/24/2017>> 300  Medication:    Current Outpatient Medications:  .  lisinopril-hydrochlorothiazide (PRINZIDE,ZESTORETIC) 20-12.5 MG tablet, Take 1 tablet by mouth daily. (Patient not taking: Reported on 01/18/2018), Disp: 30 tablet, Rfl: 3 .  megestrol (MEGACE ES) 625 MG/5ML suspension, Take 5 mLs (625 mg total) by mouth daily. (Patient not taking: Reported on 01/18/2018), Disp: 150 mL, Rfl: 0 .  Multiple Vitamin (MULTIVITAMIN WITH MINERALS) TABS tablet, Take 1 tablet by mouth daily., Disp: , Rfl:  .  ondansetron (ZOFRAN) 8 MG tablet, One pill every 8 hours as needed for nausea/vomitting., Disp: 40 tablet, Rfl: 1 .  prochlorperazine (COMPAZINE) 10 MG tablet, Take 1 tablet (10 mg total) by mouth every 6 (six) hours as needed for nausea or vomiting., Disp: 40 tablet, Rfl: 1 .  SPIRIVA HANDIHALER 18 MCG inhalation capsule, INHALE 1 CAPSULE VIA HANDIHALER ONCE DAILY AT THE SAME TIME EVERY DAY, Disp: 30 capsule, Rfl: 0   Allergies:  Patient has no known allergies.  Review of Systems  Constitutional: Negative for  chills and fever.  HENT: Negative for hearing loss and tinnitus.   Eyes: Negative for blurred vision and double vision.  Respiratory: Positive for cough. Negative for sputum production and shortness of breath.   Cardiovascular: Negative for chest pain and palpitations.  Gastrointestinal: Negative for heartburn.  Genitourinary: Negative for dysuria and urgency.  Musculoskeletal: Negative for myalgias and neck pain.  Skin: Negative  for itching and rash.  Neurological: Negative for dizziness and headaches.  Endo/Heme/Allergies: Does not bruise/bleed easily.  Psychiatric/Behavioral: Negative for memory loss. The patient does not have insomnia.     Physical Examination:   VS: BP 102/78 (BP Location: Left Arm, Cuff Size: Normal)   Pulse (!) 110   Ht 5\' 8"  (1.727 m)   Wt 143 lb (64.9 kg)   SpO2 92%   BMI 21.74 kg/m   General Appearance: No distress  Neuro:without focal findings, mental status, speech normal, alert and oriented HEENT: PERRLA, EOM intact Pulmonary: No wheezing, No rales  CardiovascularNormal S1,S2.  No m/r/g.  Abdomen: Benign, Soft, non-tender, No masses Renal:  No costovertebral tenderness  GU:  No performed at this time. Endoc: No evident thyromegaly, no signs of acromegaly or Cushing features Skin:   warm, no rashes, no ecchymosis  Extremities: normal, no cyanosis, clubbing.    Physical Examination:   VS: BP 102/78 (BP Location: Left Arm, Cuff Size: Normal)   Pulse (!) 110   Ht 5\' 8"  (1.727 m)   Wt 143 lb (64.9 kg)   SpO2 92%   BMI 21.74 kg/m   General Appearance: No distress  Neuro:without focal findings, mental status, speech normal, alert and oriented HEENT: PERRLA, EOM intact Pulmonary: No wheezing, No rales  CardiovascularNormal S1,S2.  No m/r/g.  Abdomen: Benign, Soft, non-tender, No masses Renal:  No costovertebral tenderness  GU:  No performed at this time. Endoc: No evident thyromegaly, no signs of acromegaly or Cushing features Skin:   warm, no rashes, no ecchymosis  Extremities: normal, no cyanosis, clubbing.      LABORATORY PANEL:   CBC No results for input(s): WBC, HGB, HCT, PLT in the last 168 hours. ------------------------------------------------------------------------------------------------------------------  Chemistries  No results for input(s): NA, K, CL, CO2, GLUCOSE, BUN, CREATININE, CALCIUM, MG, AST, ALT, ALKPHOS, BILITOT in the last 168  hours.  Invalid input(s): GFRCGP ------------------------------------------------------------------------------------------------------------------  Cardiac Enzymes No results for input(s): TROPONINI in the last 168 hours. ------------------------------------------------------------  RADIOLOGY:  No results found.     Thank  you for the consultation and for allowing [facility] to assist in the care of your patient. Our recommendations are noted above.  Please contact us if we can be of further service.   Marda Stalker, M.D., F.C.C.P.  Board Certified in Internal Medicine, Pulmonary Medicine, [place], and Sleep Medicine.  [facility] Office Number: [phone]   02/07/2018

## 2018-02-08 ENCOUNTER — Inpatient Hospital Stay (HOSPITAL_BASED_OUTPATIENT_CLINIC_OR_DEPARTMENT_OTHER): Payer: BC Managed Care – PPO | Admitting: Internal Medicine

## 2018-02-08 ENCOUNTER — Inpatient Hospital Stay: Payer: BC Managed Care – PPO

## 2018-02-08 VITALS — BP 118/85 | HR 105 | Temp 97.8°F | Resp 16 | Wt 144.0 lb

## 2018-02-08 DIAGNOSIS — C7951 Secondary malignant neoplasm of bone: Secondary | ICD-10-CM | POA: Diagnosis not present

## 2018-02-08 DIAGNOSIS — C3411 Malignant neoplasm of upper lobe, right bronchus or lung: Secondary | ICD-10-CM

## 2018-02-08 DIAGNOSIS — C7931 Secondary malignant neoplasm of brain: Secondary | ICD-10-CM | POA: Diagnosis not present

## 2018-02-08 DIAGNOSIS — Z87891 Personal history of nicotine dependence: Secondary | ICD-10-CM

## 2018-02-08 DIAGNOSIS — C787 Secondary malignant neoplasm of liver and intrahepatic bile duct: Secondary | ICD-10-CM

## 2018-02-08 DIAGNOSIS — C782 Secondary malignant neoplasm of pleura: Secondary | ICD-10-CM

## 2018-02-08 DIAGNOSIS — Z79899 Other long term (current) drug therapy: Secondary | ICD-10-CM

## 2018-02-08 LAB — COMPREHENSIVE METABOLIC PANEL
ALT: 32 U/L (ref 0–44)
AST: 32 U/L (ref 15–41)
Albumin: 3.9 g/dL (ref 3.5–5.0)
Alkaline Phosphatase: 56 U/L (ref 38–126)
Anion gap: 9 (ref 5–15)
BUN: 16 mg/dL (ref 8–23)
CHLORIDE: 104 mmol/L (ref 98–111)
CO2: 24 mmol/L (ref 22–32)
Calcium: 9.3 mg/dL (ref 8.9–10.3)
Creatinine, Ser: 0.87 mg/dL (ref 0.61–1.24)
Glucose, Bld: 102 mg/dL — ABNORMAL HIGH (ref 70–99)
POTASSIUM: 4.2 mmol/L (ref 3.5–5.1)
SODIUM: 137 mmol/L (ref 135–145)
Total Bilirubin: 0.6 mg/dL (ref 0.3–1.2)
Total Protein: 7 g/dL (ref 6.5–8.1)

## 2018-02-08 LAB — CBC WITH DIFFERENTIAL/PLATELET
Basophils Absolute: 0.1 10*3/uL (ref 0–0.1)
Basophils Relative: 1 %
EOS PCT: 2 %
Eosinophils Absolute: 0.1 10*3/uL (ref 0–0.7)
HEMATOCRIT: 32 % — AB (ref 40.0–52.0)
Hemoglobin: 11.3 g/dL — ABNORMAL LOW (ref 13.0–18.0)
LYMPHS ABS: 1.1 10*3/uL (ref 1.0–3.6)
Lymphocytes Relative: 20 %
MCH: 34.2 pg — AB (ref 26.0–34.0)
MCHC: 35.2 g/dL (ref 32.0–36.0)
MCV: 97.2 fL (ref 80.0–100.0)
MONO ABS: 0.8 10*3/uL (ref 0.2–1.0)
Monocytes Relative: 15 %
NEUTROS ABS: 3.3 10*3/uL (ref 1.4–6.5)
Neutrophils Relative %: 62 %
PLATELETS: 182 10*3/uL (ref 150–440)
RBC: 3.3 MIL/uL — AB (ref 4.40–5.90)
RDW: 16.5 % — AB (ref 11.5–14.5)
WBC: 5.3 10*3/uL (ref 3.8–10.6)

## 2018-02-08 LAB — SAMPLE TO BLOOD BANK

## 2018-02-08 NOTE — Progress Notes (Signed)
[facility] OFFICE PROGRESS NOTE  Patient Care Team: Valerie Roys, DO as PCP - General (Family Medicine) Telford Nab, RN as Registered Nurse Rogue Bussing, Elisha Headland, MD as Medical Oncologist (Medical Oncology)  Cancer Staging No matching staging information was found for the patient.   Oncology History   # July 2019- [facility] CA- RUL [s/p Liver Bx]-CT chest- RUL/ liver/ brain solitary lesion.  to contralateral lung; pleura; liver and brain.July 2019- A/P- liver metastases at least 2; bone scan shows right humeral; left fifth rib; L2 vertebral body metastases.  # July 31st- carbo-tax-Atezo+Avastin   # Brain metastases- right occipital x 2.5 cm; SBRT [June 10th-24th] [Dr.Crystal]  --------------------------------------------------------------------   DIAGNOSIS: Adenocarcinoma lung  STAGE: 4       ;GOALS: Palliative  CURRENT/MOST RECENT THERAPY -CARBO+TAXOL+ATEZO+AVASTIN      Primary cancer of right upper lobe of lung ([place])   11/24/2017 -  Chemotherapy    The patient had bevacizumab (AVASTIN) 1,000 mg in sodium chloride 0.9 % 100 mL chemo infusion, 15 mg/kg = 1,000 mg, Intravenous,  Once, 3 of 4 cycles Administration: 1,000 mg (12/16/2017), 1,000 mg (01/19/2018)  for chemotherapy treatment.     12/09/2017 -  Chemotherapy    The patient had palonosetron (ALOXI) injection 0.25 mg, 0.25 mg, Intravenous,  Once, 3 of 4 cycles Administration: 0.25 mg (12/16/2017), 0.25 mg (01/19/2018) CARBOplatin (PARAPLATIN) 670 mg in sodium chloride 0.9 % 250 mL chemo infusion, 670 mg (100 % of original dose 667.8 mg), Intravenous,  Once, 3 of 4 cycles Dose modification:   (original dose 667.8 mg, Cycle 1, Reason: Provider Judgment) Administration: 670 mg (12/16/2017), 610 mg (01/19/2018) PACLitaxel (TAXOL) 360 mg in sodium chloride 0.9 % 500 mL chemo infusion (> 92m/m2), 200 mg/m2 = 360 mg, Intravenous,  Once, 3 of 4 cycles Administration: 360 mg (12/16/2017), 360 mg (01/19/2018) atezolizumab  (TECENTRIQ) 1,200 mg in sodium chloride 0.9 % 250 mL chemo infusion, 1,200 mg, Intravenous, Once, 3 of 4 cycles Administration: 1,200 mg (12/16/2017), 1,200 mg (01/19/2018)  for chemotherapy treatment.        INTERVAL HISTORY:  James [id number].  [name] of a history of metastatic adenocarcinoma the lung currently status post cycle #2 carbotaxol Avastin plus Tecentriq is here for follow-up/review the results of the restaging CAT scan.  Patient denies any unusual nausea vomiting.  Continues to have ongoing mild  shortness of breath or cough.  Denies any tingling or numbness.  Denies any pain.  Review of Systems  Constitutional: Positive for malaise/fatigue. Negative for chills, diaphoresis, fever and weight loss.  HENT: Negative for nosebleeds and sore throat.   Eyes: Negative for double vision.  Respiratory: Positive for cough and shortness of breath. Negative for hemoptysis, sputum production and wheezing.   Cardiovascular: Negative for chest pain, palpitations, orthopnea and leg swelling.  Gastrointestinal: Negative for abdominal pain, blood in stool, constipation, diarrhea, heartburn, melena and vomiting.  Genitourinary: Negative for dysuria, frequency and urgency.  Musculoskeletal: Negative for back pain and joint pain.  Skin: Negative.  Negative for itching and rash.  Neurological: Negative for dizziness, tingling, focal weakness, weakness and headaches.  Endo/Heme/Allergies: Does not bruise/bleed easily.  Psychiatric/Behavioral: Negative for depression. The patient is not nervous/anxious and does not have insomnia.       PAST MEDICAL HISTORY :  Past Medical History:  Diagnosis Date  . [place]   Primary cancer of right upper lobe of lung. Mets to liver, brain.  .Marland KitchenPrepatellar bursitis of left knee  patient denies    PAST SURGICAL HISTORY :   Past Surgical History:  Procedure Laterality Date  . collapsed lung  2012   "had to reinflate" " i carried large  piece of sheet rock up stairs by myself "   . [place]   x2 , second was with mesh   . TOTAL HIP ARTHROPLASTY Right 04/09/2017   Procedure: RIGHT TOTAL HIP ARTHROPLASTY ANTERIOR APPROACH;  Surgeon: Mcarthur Rossetti, MD;  Location: [facility];  Service: Orthopedics;  Laterality: Right;  . URETHRAL STRICTURE DILATATION      FAMILY HISTORY :  No family history on file.  SOCIAL HISTORY:   Social History   Tobacco Use  . Smoking status: Former Smoker    Types: Cigarettes    Last attempt to quit: 03/18/2012    Years since quitting: 5.9  . Smokeless tobacco: Never Used  . Tobacco comment: 5 years   Substance Use Topics  . Alcohol use: Yes    Comment: Socially  . Drug use: Yes    Types: Marijuana    Comment: 1 month ago     ALLERGIES:  has No Known Allergies.  MEDICATIONS:  Current Outpatient Medications  Medication Sig Dispense Refill  . albuterol (PROAIR HFA) 108 (90 Base) MCG/ACT inhaler Inhale 1-2 puffs into the lungs every 6 (six) hours as needed for wheezing or shortness of breath. 1 Inhaler 5  . Glycopyrrolate-Formoterol (BEVESPI AEROSPHERE) 9-4.8 MCG/ACT AERO Inhale 2 puffs into the lungs daily. 1 Inhaler 5  . lisinopril-hydrochlorothiazide (PRINZIDE,ZESTORETIC) 20-12.5 MG tablet Take 1 tablet by mouth daily. 30 tablet 3  . Multiple Vitamin (MULTIVITAMIN WITH MINERALS) TABS tablet Take 1 tablet by mouth daily.    Marland Kitchen SPIRIVA HANDIHALER 18 MCG inhalation capsule INHALE 1 CAPSULE VIA HANDIHALER ONCE DAILY AT THE SAME TIME EVERY DAY 30 capsule 0  . megestrol (MEGACE ES) 625 MG/5ML suspension Take 5 mLs (625 mg total) by mouth daily. (Patient not taking: Reported on 02/08/2018) 150 mL 0  . ondansetron (ZOFRAN) 8 MG tablet One pill every 8 hours as needed for nausea/vomitting. (Patient not taking: Reported on 02/08/2018) 40 tablet 1  . prochlorperazine (COMPAZINE) 10 MG tablet Take 1 tablet (10 mg total) by mouth every 6 (six) hours as needed for nausea or  vomiting. (Patient not taking: Reported on 02/08/2018) 40 tablet 1   No current facility-administered medications for this visit.    Facility-Administered Medications Ordered in Other Visits  Medication Dose Route Frequency Provider Last Rate Last Dose  . atezolizumab (TECENTRIQ) 1,200 mg in sodium chloride 0.9 % 250 mL chemo infusion  1,200 mg Intravenous Once Charlaine Dalton R, MD      . bevacizumab (AVASTIN) 1,000 mg in sodium chloride 0.9 % 100 mL chemo infusion  15 mg/kg (Treatment Plan Recorded) Intravenous Once Charlaine Dalton R, MD      . CARBOplatin (PARAPLATIN) 610 mg in sodium chloride 0.9 % 250 mL chemo infusion  610 mg Intravenous Once Charlaine Dalton R, MD      . denosumab (XGEVA) injection 120 mg  120 mg Subcutaneous Once Charlaine Dalton R, MD      . PACLitaxel (TAXOL) 360 mg in sodium chloride 0.9 % 500 mL chemo infusion (> 83m/m2)  200 mg/m2 (Treatment Plan Recorded) Intravenous Once BCammie Sickle MD        PHYSICAL EXAMINATION: ECOG PERFORMANCE STATUS: 1 - Symptomatic but completely ambulatory  BP 118/85 (BP Location: Left Arm, Patient Position: Sitting)   Pulse (Marland Kitchen  105   Temp 97.8 F (36.6 C) (Tympanic)   Resp 16   Wt 144 lb (65.3 kg)   BMI 21.90 kg/m   Filed Weights   02/08/18 1048 02/08/18 1052  Weight: 144 lb (65.3 kg) 144 lb (65.3 kg)    Physical Exam  Constitutional: He is oriented to person, place, and time and well-developed, well-nourished, and in no distress.  Accompanied by his wife.  HENT:  Head: Normocephalic and atraumatic.  Mouth/Throat: Oropharynx is clear and moist. No oropharyngeal exudate.  Eyes: Pupils are equal, round, and reactive to light.  Neck: Normal range of motion. Neck supple.  Cardiovascular: Normal rate and regular rhythm.  Pulmonary/Chest: No respiratory distress. He has no wheezes.  Decreased air entry bilaterally.  Abdominal: Soft. Bowel sounds are normal. He exhibits no distension and no mass. There  is no tenderness. There is no rebound and no guarding.  Musculoskeletal: Normal range of motion. He exhibits no edema or tenderness.  Neurological: He is alert and oriented to person, place, and time.  Skin: Skin is warm.  Psychiatric: Affect normal.       LABORATORY DATA:  I have reviewed the data as listed    Component Value Date/Time   NA 137 02/08/2018 1015   NA 138 08/24/2017 1651   NA 137 04/23/2013 1330   K 4.2 02/08/2018 1015   K 3.8 04/23/2013 1330   CL 104 02/08/2018 1015   CL 105 04/23/2013 1330   CO2 24 02/08/2018 1015   CO2 22 04/23/2013 1330   GLUCOSE 102 (H) 02/08/2018 1015   GLUCOSE 120 (H) 04/23/2013 1330   BUN 16 02/08/2018 1015   BUN 18 08/24/2017 1651   BUN 18 04/23/2013 1330   CREATININE 0.87 02/08/2018 1015   CREATININE 0.90 04/23/2013 1330   CALCIUM 9.3 02/08/2018 1015   CALCIUM 9.3 04/23/2013 1330   PROT 7.0 02/08/2018 1015   PROT 6.8 08/24/2017 1651   PROT 7.5 04/23/2013 1330   ALBUMIN 3.9 02/08/2018 1015   ALBUMIN 4.2 08/24/2017 1651   ALBUMIN 3.8 04/23/2013 1330   AST 32 02/08/2018 1015   AST 11 (L) 04/23/2013 1330   ALT 32 02/08/2018 1015   ALT 18 04/23/2013 1330   ALKPHOS 56 02/08/2018 1015   ALKPHOS 64 04/23/2013 1330   BILITOT 0.6 02/08/2018 1015   BILITOT 0.2 08/24/2017 1651   BILITOT 0.6 04/23/2013 1330   GFRNONAA >60 02/08/2018 1015   GFRNONAA >60 04/23/2013 1330   GFRAA >60 02/08/2018 1015   GFRAA >60 04/23/2013 1330    No results found for: SPEP, UPEP  Lab Results  Component Value Date   WBC 5.3 02/08/2018   NEUTROABS 3.3 02/08/2018   HGB 11.3 (L) 02/08/2018   HCT 32.0 (L) 02/08/2018   MCV 97.2 02/08/2018   PLT 182 02/08/2018      Chemistry      Component Value Date/Time   NA 137 02/08/2018 1015   NA 138 08/24/2017 1651   NA 137 04/23/2013 1330   K 4.2 02/08/2018 1015   K 3.8 04/23/2013 1330   CL 104 02/08/2018 1015   CL 105 04/23/2013 1330   CO2 24 02/08/2018 1015   CO2 22 04/23/2013 1330   BUN 16  02/08/2018 1015   BUN 18 08/24/2017 1651   BUN 18 04/23/2013 1330   CREATININE 0.87 02/08/2018 1015   CREATININE 0.90 04/23/2013 1330      Component Value Date/Time   CALCIUM 9.3 02/08/2018 1015   CALCIUM 9.3 04/23/2013  1330   ALKPHOS 56 02/08/2018 1015   ALKPHOS 64 04/23/2013 1330   AST 32 02/08/2018 1015   AST 11 (L) 04/23/2013 1330   ALT 32 02/08/2018 1015   ALT 18 04/23/2013 1330   BILITOT 0.6 02/08/2018 1015   BILITOT 0.2 08/24/2017 1651   BILITOT 0.6 04/23/2013 1330       RADIOGRAPHIC STUDIES: I have personally reviewed the radiological images as listed and agreed with the findings in the report. No results found.   ASSESSMENT & PLAN:  Primary cancer of right upper lobe of lung ([place]) #Lung adenocarcinoma-stage IV-metastases- STABLE. S/p carbotaxol plus Avastin plus Tecentriq cycle # 2;  SEP 2019- Partial response/stable disease.   # Proceed with carbo-Taxol-avastin-Tecentriq # 3 today. Labs today reviewed;  acceptable for treatment today.   # Bone mets- X-geva continue-  calcium vitamin D. Proceed with today.  STABLE.   #Loss of appetite/fatigue-secondary to chemotherapy; currently improved.   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 will plan MRI brian   # follow up on 10/16;labs/MD- [facility]; carbo-taxol-avaston-tecen on 10/17 [[place]]   # I reviewed the blood work- with the patient in detail; also reviewed the imaging independently [as summarized above]; and with the patient in detail.    Orders Placed This Encounter  Procedures  . MR Brain W Wo Contrast    Standing Status:   Future    Standing Expiration Date:   02/08/2019    Order Specific Question:   ** REASON FOR EXAM (FREE TEXT)    Answer:   brain met s/p RT; on chemo    Order Specific Question:   If indicated for the ordered procedure, I authorize the administration of contrast media per Radiology protocol    Answer:   Yes    Order Specific Question:   What is the patient's sedation  requirement?    Answer:   No Sedation    Order Specific Question:   Does the patient have a pacemaker or implanted devices?    Answer:   No    Order Specific Question:   Use SRS Protocol?    Answer:   No    Order Specific Question:   Radiology Contrast Protocol - do NOT remove file path    Answer:   [email or web address]    Order Specific Question:   Preferred imaging location?    Answer:   [facility] [place] (table limit-350lbs)   All questions were answered. The patient knows to call the clinic with any problems, questions or concerns.      Cammie Sickle, MD 02/09/2018 9:59 AM

## 2018-02-08 NOTE — Assessment & Plan Note (Addendum)
#  Lung adenocarcinoma-stage IV-metastases- STABLE. S/p carbotaxol plus Avastin plus Tecentriq cycle # 2;  SEP 2019- Partial response/stable disease.   # Proceed with carbo-Taxol-avastin-Tecentriq # 3 today. Labs today reviewed;  acceptable for treatment today.   # Bone mets- X-geva continue-  calcium vitamin D. Proceed with today.  STABLE.   #Loss of appetite/fatigue-secondary to chemotherapy; currently improved.   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 will plan MRI brian   # follow up on 10/16;labs/MD- [place]; carbo-taxol-avaston-tecen on 10/17 [[place]]   # I reviewed the blood work- with the patient in detail; also reviewed the imaging independently [as summarized above]; and with the patient in detail.

## 2018-02-08 NOTE — Progress Notes (Signed)
Patient c/o trouble sleeping. Patient states that mouth gets very dry, and sometimes he feels very anxious - which disrupts his sleeping.

## 2018-02-09 ENCOUNTER — Telehealth: Payer: Self-pay | Admitting: Internal Medicine

## 2018-02-09 ENCOUNTER — Inpatient Hospital Stay: Payer: BC Managed Care – PPO

## 2018-02-09 ENCOUNTER — Ambulatory Visit: Payer: BC Managed Care – PPO

## 2018-02-09 VITALS — BP 125/83 | HR 85 | Temp 97.6°F | Resp 16

## 2018-02-09 DIAGNOSIS — C3411 Malignant neoplasm of upper lobe, right bronchus or lung: Secondary | ICD-10-CM | POA: Diagnosis not present

## 2018-02-09 DIAGNOSIS — C7951 Secondary malignant neoplasm of bone: Secondary | ICD-10-CM

## 2018-02-09 LAB — TSH: TSH: 2.31 u[IU]/mL (ref 0.350–4.500)

## 2018-02-09 MED ORDER — SODIUM CHLORIDE 0.9 % IV SOLN
20.0000 mg | Freq: Once | INTRAVENOUS | Status: AC
  Administered 2018-02-09: 20 mg via INTRAVENOUS
  Filled 2018-02-09: qty 2

## 2018-02-09 MED ORDER — SODIUM CHLORIDE 0.9 % IV SOLN
1200.0000 mg | Freq: Once | INTRAVENOUS | Status: AC
  Administered 2018-02-09: 1200 mg via INTRAVENOUS
  Filled 2018-02-09: qty 20

## 2018-02-09 MED ORDER — SODIUM CHLORIDE 0.9 % IV SOLN
15.0000 mg/kg | Freq: Once | INTRAVENOUS | Status: AC
  Administered 2018-02-09: 1000 mg via INTRAVENOUS
  Filled 2018-02-09: qty 32

## 2018-02-09 MED ORDER — DIPHENHYDRAMINE HCL 50 MG/ML IJ SOLN
50.0000 mg | Freq: Once | INTRAMUSCULAR | Status: AC
  Administered 2018-02-09: 50 mg via INTRAVENOUS
  Filled 2018-02-09: qty 1

## 2018-02-09 MED ORDER — PALONOSETRON HCL INJECTION 0.25 MG/5ML
0.2500 mg | Freq: Once | INTRAVENOUS | Status: AC
  Administered 2018-02-09: 0.25 mg via INTRAVENOUS
  Filled 2018-02-09: qty 5

## 2018-02-09 MED ORDER — SODIUM CHLORIDE 0.9 % IV SOLN
Freq: Once | INTRAVENOUS | Status: AC
  Administered 2018-02-09: 09:00:00 via INTRAVENOUS
  Filled 2018-02-09: qty 250

## 2018-02-09 MED ORDER — SODIUM CHLORIDE 0.9 % IV SOLN
610.0000 mg | Freq: Once | INTRAVENOUS | Status: AC
  Administered 2018-02-09: 610 mg via INTRAVENOUS
  Filled 2018-02-09: qty 61

## 2018-02-09 MED ORDER — DENOSUMAB 120 MG/1.7ML ~~LOC~~ SOLN
120.0000 mg | Freq: Once | SUBCUTANEOUS | Status: AC
  Administered 2018-02-09: 120 mg via SUBCUTANEOUS
  Filled 2018-02-09: qty 1.7

## 2018-02-09 MED ORDER — FAMOTIDINE IN NACL 20-0.9 MG/50ML-% IV SOLN
20.0000 mg | Freq: Once | INTRAVENOUS | Status: AC
  Administered 2018-02-09: 20 mg via INTRAVENOUS
  Filled 2018-02-09: qty 50

## 2018-02-09 MED ORDER — SODIUM CHLORIDE 0.9 % IV SOLN
200.0000 mg/m2 | Freq: Once | INTRAVENOUS | Status: AC
  Administered 2018-02-09: 360 mg via INTRAVENOUS
  Filled 2018-02-09: qty 60

## 2018-02-09 NOTE — Telephone Encounter (Signed)
Robin-this patient's MRI it needs to be scheduled prior to the next visit in 3 weeks with me.  Thank you

## 2018-02-09 NOTE — Telephone Encounter (Signed)
MRI Brain [place] location 10/10 @ 11 am

## 2018-02-09 NOTE — Progress Notes (Signed)
Per Dr. Rogue Bussing okay to treat with urine protein from 01/19/18.

## 2018-02-09 NOTE — Progress Notes (Signed)
Last TSH 08/10/16.  Asked to add TSH for this visit during patients last visit, no labs ordered.  Patient got labs in [place] yesterday so cannot add on today.  MD ok to proceed with treatment today and to add on TSH lab next visit.

## 2018-02-14 ENCOUNTER — Telehealth: Payer: Self-pay | Admitting: *Deleted

## 2018-02-14 DIAGNOSIS — C3490 Malignant neoplasm of unspecified part of unspecified bronchus or lung: Secondary | ICD-10-CM

## 2018-02-14 NOTE — Telephone Encounter (Signed)
Pt's wife called to question if patient needs labs checked 1 week after receiving chemo. Per Dr. Jacinto Reap, okay to schedule for labs on 10/2 to check cbc, cmp. Pt scheduled for lab only visit on 10/2 at 10:30am. Pt's wife made aware of appt. Nothing further needed at this time.

## 2018-02-16 ENCOUNTER — Inpatient Hospital Stay: Payer: BC Managed Care – PPO | Attending: Internal Medicine

## 2018-02-16 DIAGNOSIS — Z5111 Encounter for antineoplastic chemotherapy: Secondary | ICD-10-CM | POA: Diagnosis not present

## 2018-02-16 DIAGNOSIS — C7931 Secondary malignant neoplasm of brain: Secondary | ICD-10-CM | POA: Insufficient documentation

## 2018-02-16 DIAGNOSIS — Z79899 Other long term (current) drug therapy: Secondary | ICD-10-CM | POA: Diagnosis not present

## 2018-02-16 DIAGNOSIS — C787 Secondary malignant neoplasm of liver and intrahepatic bile duct: Secondary | ICD-10-CM | POA: Insufficient documentation

## 2018-02-16 DIAGNOSIS — Z87891 Personal history of nicotine dependence: Secondary | ICD-10-CM | POA: Diagnosis not present

## 2018-02-16 DIAGNOSIS — T451X5A Adverse effect of antineoplastic and immunosuppressive drugs, initial encounter: Secondary | ICD-10-CM | POA: Diagnosis not present

## 2018-02-16 DIAGNOSIS — C3490 Malignant neoplasm of unspecified part of unspecified bronchus or lung: Secondary | ICD-10-CM

## 2018-02-16 DIAGNOSIS — C3411 Malignant neoplasm of upper lobe, right bronchus or lung: Secondary | ICD-10-CM | POA: Insufficient documentation

## 2018-02-16 DIAGNOSIS — R634 Abnormal weight loss: Secondary | ICD-10-CM | POA: Insufficient documentation

## 2018-02-16 DIAGNOSIS — C7951 Secondary malignant neoplasm of bone: Secondary | ICD-10-CM | POA: Insufficient documentation

## 2018-02-16 DIAGNOSIS — D709 Neutropenia, unspecified: Secondary | ICD-10-CM | POA: Insufficient documentation

## 2018-02-16 DIAGNOSIS — J449 Chronic obstructive pulmonary disease, unspecified: Secondary | ICD-10-CM | POA: Insufficient documentation

## 2018-02-16 LAB — CBC WITH DIFFERENTIAL/PLATELET
BASOS ABS: 0 10*3/uL (ref 0–0.1)
BASOS PCT: 1 %
EOS ABS: 0.1 10*3/uL (ref 0–0.7)
Eosinophils Relative: 5 %
HCT: 26.3 % — ABNORMAL LOW (ref 40.0–52.0)
Hemoglobin: 9.2 g/dL — ABNORMAL LOW (ref 13.0–18.0)
Lymphocytes Relative: 29 %
Lymphs Abs: 0.8 10*3/uL — ABNORMAL LOW (ref 1.0–3.6)
MCH: 34.3 pg — ABNORMAL HIGH (ref 26.0–34.0)
MCHC: 35 g/dL (ref 32.0–36.0)
MCV: 98.1 fL (ref 80.0–100.0)
MONOS PCT: 3 %
Monocytes Absolute: 0.1 10*3/uL — ABNORMAL LOW (ref 0.2–1.0)
NEUTROS PCT: 62 %
Neutro Abs: 1.7 10*3/uL (ref 1.4–6.5)
Platelets: 124 10*3/uL — ABNORMAL LOW (ref 150–440)
RBC: 2.68 MIL/uL — ABNORMAL LOW (ref 4.40–5.90)
RDW: 14.9 % — AB (ref 11.5–14.5)
WBC: 2.8 10*3/uL — ABNORMAL LOW (ref 3.8–10.6)

## 2018-02-16 LAB — COMPREHENSIVE METABOLIC PANEL
ALK PHOS: 45 U/L (ref 38–126)
ALT: 29 U/L (ref 0–44)
AST: 35 U/L (ref 15–41)
Albumin: 3.7 g/dL (ref 3.5–5.0)
Anion gap: 10 (ref 5–15)
BUN: 15 mg/dL (ref 8–23)
CALCIUM: 9 mg/dL (ref 8.9–10.3)
CHLORIDE: 105 mmol/L (ref 98–111)
CO2: 22 mmol/L (ref 22–32)
CREATININE: 0.9 mg/dL (ref 0.61–1.24)
GFR calc Af Amer: >60 mL/min (ref >60)
GFR calc non Af Amer: >60 mL/min (ref >60)
GLUCOSE: 117 mg/dL — AB (ref 70–99)
Potassium: 4.1 mmol/L (ref 3.5–5.1)
SODIUM: 137 mmol/L (ref 135–145)
Total Bilirubin: 0.7 mg/dL (ref 0.3–1.2)
Total Protein: 6.7 g/dL (ref 6.5–8.1)

## 2018-02-24 ENCOUNTER — Ambulatory Visit
Admission: RE | Admit: 2018-02-24 | Discharge: 2018-02-24 | Disposition: A | Discharge status: Home / Self Care | Payer: BC Managed Care – PPO | Source: Ambulatory Visit | Attending: Internal Medicine | Admitting: Internal Medicine

## 2018-02-24 DIAGNOSIS — C7931 Secondary malignant neoplasm of brain: Secondary | ICD-10-CM | POA: Insufficient documentation

## 2018-02-24 DIAGNOSIS — C3411 Malignant neoplasm of upper lobe, right bronchus or lung: Secondary | ICD-10-CM | POA: Insufficient documentation

## 2018-02-24 MED ORDER — GADOBENATE DIMEGLUMINE 529 MG/ML IV SOLN
15.0000 mL | Freq: Once | INTRAVENOUS | Status: AC | PRN
  Administered 2018-02-24: 13 mL via INTRAVENOUS

## 2018-03-01 ENCOUNTER — Other Ambulatory Visit: Payer: Self-pay | Admitting: Internal Medicine

## 2018-03-01 NOTE — Progress Notes (Signed)
Avastin is included in the antibody section of treatment plan. Thanks

## 2018-03-02 ENCOUNTER — Inpatient Hospital Stay (HOSPITAL_BASED_OUTPATIENT_CLINIC_OR_DEPARTMENT_OTHER): Payer: BC Managed Care – PPO | Admitting: Internal Medicine

## 2018-03-02 ENCOUNTER — Other Ambulatory Visit: Payer: Self-pay

## 2018-03-02 ENCOUNTER — Inpatient Hospital Stay: Payer: BC Managed Care – PPO

## 2018-03-02 ENCOUNTER — Other Ambulatory Visit: Payer: Self-pay | Admitting: *Deleted

## 2018-03-02 VITALS — BP 112/77 | HR 116 | Temp 97.6°F | Resp 20 | Ht 68.0 in | Wt 140.0 lb

## 2018-03-02 DIAGNOSIS — R634 Abnormal weight loss: Secondary | ICD-10-CM

## 2018-03-02 DIAGNOSIS — C7931 Secondary malignant neoplasm of brain: Secondary | ICD-10-CM | POA: Diagnosis not present

## 2018-03-02 DIAGNOSIS — Z87891 Personal history of nicotine dependence: Secondary | ICD-10-CM

## 2018-03-02 DIAGNOSIS — C7951 Secondary malignant neoplasm of bone: Secondary | ICD-10-CM

## 2018-03-02 DIAGNOSIS — C3411 Malignant neoplasm of upper lobe, right bronchus or lung: Secondary | ICD-10-CM | POA: Diagnosis not present

## 2018-03-02 DIAGNOSIS — J449 Chronic obstructive pulmonary disease, unspecified: Secondary | ICD-10-CM

## 2018-03-02 DIAGNOSIS — Z79899 Other long term (current) drug therapy: Secondary | ICD-10-CM

## 2018-03-02 DIAGNOSIS — C787 Secondary malignant neoplasm of liver and intrahepatic bile duct: Secondary | ICD-10-CM | POA: Diagnosis not present

## 2018-03-02 LAB — COMPREHENSIVE METABOLIC PANEL
ALK PHOS: 52 U/L (ref 38–126)
ALT: 16 U/L (ref 0–44)
ANION GAP: 7 (ref 5–15)
AST: 24 U/L (ref 15–41)
Albumin: 3.8 g/dL (ref 3.5–5.0)
BUN: 12 mg/dL (ref 8–23)
CALCIUM: 8.4 mg/dL — AB (ref 8.9–10.3)
CO2: 24 mmol/L (ref 22–32)
Chloride: 106 mmol/L (ref 98–111)
Creatinine, Ser: 0.91 mg/dL (ref 0.61–1.24)
GLUCOSE: 167 mg/dL — AB (ref 70–99)
Potassium: 4.1 mmol/L (ref 3.5–5.1)
Sodium: 137 mmol/L (ref 135–145)
Total Bilirubin: 0.5 mg/dL (ref 0.3–1.2)
Total Protein: 7 g/dL (ref 6.5–8.1)

## 2018-03-02 LAB — CBC WITH DIFFERENTIAL/PLATELET
Abs Immature Granulocytes: 0.01 10*3/uL (ref 0.00–0.07)
BASOS PCT: 1 %
Basophils Absolute: 0 10*3/uL (ref 0.0–0.1)
EOS ABS: 0.1 10*3/uL (ref 0.0–0.5)
EOS PCT: 2 %
HCT: 33 % — ABNORMAL LOW (ref 39.0–52.0)
Hemoglobin: 10.9 g/dL — ABNORMAL LOW (ref 13.0–17.0)
Immature Granulocytes: 0 %
Lymphocytes Relative: 18 %
Lymphs Abs: 0.7 10*3/uL (ref 0.7–4.0)
MCH: 33.5 pg (ref 26.0–34.0)
MCHC: 33 g/dL (ref 30.0–36.0)
MCV: 101.5 fL — AB (ref 80.0–100.0)
MONO ABS: 0.4 10*3/uL (ref 0.1–1.0)
MONOS PCT: 10 %
Neutro Abs: 2.8 10*3/uL (ref 1.7–7.7)
Neutrophils Relative %: 69 %
PLATELETS: 150 10*3/uL (ref 150–400)
RBC: 3.25 MIL/uL — AB (ref 4.22–5.81)
RDW: 14.3 % (ref 11.5–15.5)
WBC: 4.1 10*3/uL (ref 4.0–10.5)
nRBC: 0 % (ref 0.0–0.2)

## 2018-03-02 NOTE — Progress Notes (Signed)
[place] OFFICE PROGRESS NOTE  Patient Care Team: James Roys, DO as PCP - General (Family Medicine) James Nab, RN as Registered Nurse James [name], James Headland, MD as Medical Oncologist (Medical Oncology)  Cancer Staging No matching staging information was found for the patient.   Oncology History   # July 2019- [facility] CA- RUL [s/p Liver Bx]-CT chest- RUL/ liver/ brain solitary lesion.  to contralateral lung; pleura; liver and brain.July 2019- A/P- liver metastases at least 2; bone scan shows right humeral; left fifth rib; L2 vertebral body metastases.  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  --------------------------------------------------------------------   DIAGNOSIS: Adenocarcinoma lung  STAGE: 4       ;GOALS: Palliative  CURRENT/MOST RECENT THERAPY -CARBO+TAXOL+ATEZO+AVASTIN      Primary cancer of right upper lobe of lung (James [name])   11/24/2017 -  Chemotherapy    The patient had bevacizumab (AVASTIN) 1,000 mg in sodium chloride 0.9 % 100 mL chemo infusion, 15 mg/kg = 1,000 mg, Intravenous,  Once, 3 of 4 cycles Administration: 1,000 mg (12/16/2017), 1,000 mg (01/19/2018), 1,000 mg (02/09/2018)  for chemotherapy treatment.     12/09/2017 -  Chemotherapy    The patient had palonosetron (ALOXI) injection 0.25 mg, 0.25 mg, Intravenous,  Once, 3 of 4 cycles Administration: 0.25 mg (12/16/2017), 0.25 mg (01/19/2018), 0.25 mg (02/09/2018) CARBOplatin (PARAPLATIN) 670 mg in sodium chloride 0.9 % 250 mL chemo infusion, 670 mg (100 % of original dose 667.8 mg), Intravenous,  Once, 3 of 4 cycles Dose modification:   (original dose 667.8 mg, Cycle 1, Reason: Provider Judgment) Administration: 670 mg (12/16/2017), 610 mg (01/19/2018), 610 mg (02/09/2018) PACLitaxel (TAXOL) 360 mg in sodium chloride 0.9 % 500 mL chemo infusion (> 53m/m2), 200 mg/m2 = 360 mg, Intravenous,  Once, 3  of 4 cycles Administration: 360 mg (12/16/2017), 360 mg (01/19/2018), 360 mg (02/09/2018) atezolizumab (TECENTRIQ) 1,200 mg in sodium chloride 0.9 % 250 mL chemo infusion, 1,200 mg, Intravenous, Once, 3 of 4 cycles Administration: 1,200 mg (12/16/2017), 1,200 mg (01/19/2018), 1,200 mg (02/09/2018)  for chemotherapy treatment.        INTERVAL HISTORY:  James Gitlin89y.o.  male of a history of metastatic adenocarcinoma the lung currently status post cycle #3 carbotaxol Avastin plus Tecentriq is here for follow-up.  Patient last few days has lost appetite.  Lost few pounds.  . Complains of ongoing mild shortness of breath and productive cough especially in the mornings.  Denies any tingling or numbness.  Denies any pain  Review of Systems  Constitutional: Positive for malaise/fatigue and weight loss. Negative for chills, diaphoresis and fever.  HENT: Negative for nosebleeds and sore throat.   Eyes: Negative for double vision.  Respiratory: Positive for cough and shortness of breath. Negative for hemoptysis, sputum production and wheezing.   Cardiovascular: Negative for chest pain, palpitations, orthopnea and leg swelling.  Gastrointestinal: Negative for abdominal pain, blood in stool, constipation, diarrhea, heartburn, melena and vomiting.  Genitourinary: Negative for dysuria, frequency and urgency.  Musculoskeletal: Negative for back pain and joint pain.  Skin: Negative.  Negative for itching and rash.  Neurological: Negative for dizziness, tingling, focal weakness, weakness and headaches.  Endo/Heme/Allergies: Does not bruise/bleed easily.  Psychiatric/Behavioral: Negative for depression. The patient is not nervous/anxious and does not have insomnia.       PAST MEDICAL HISTORY :  Past Medical History:  Diagnosis Date  . Cancer ([place] 12/01/2017  Primary cancer of right upper lobe of lung. Mets to liver, brain.  Marland Kitchen Prepatellar bursitis of left knee    patient denies    PAST SURGICAL  HISTORY :   Past Surgical History:  Procedure Laterality Date  . collapsed lung  2012   "had to reinflate" " i carried large piece of sheet rock up stairs by myself "   . [place]   x2 , second was with mesh   . TOTAL HIP ARTHROPLASTY Right 04/09/2017   Procedure: RIGHT TOTAL HIP ARTHROPLASTY ANTERIOR APPROACH;  Surgeon: James Rossetti, MD;  Location: [facility];  Service: Orthopedics;  Laterality: Right;  . URETHRAL STRICTURE DILATATION      FAMILY HISTORY :  No family history on file.  SOCIAL HISTORY:   Social History   Tobacco Use  . Smoking status: Former Smoker    Types: Cigarettes    Last attempt to quit: 03/18/2012    Years since quitting: 5.9  . Smokeless tobacco: Never Used  . Tobacco comment: 5 years   Substance Use Topics  . Alcohol use: Yes    Comment: Socially  . Drug use: Yes    Types: Marijuana    Comment: 1 month ago     ALLERGIES:  has No Known Allergies.  MEDICATIONS:  Current Outpatient Medications  Medication Sig Dispense Refill  . albuterol (PROAIR HFA) 108 (90 Base) MCG/ACT inhaler Inhale 1-2 puffs into the lungs every 6 (six) hours as needed for wheezing or shortness of breath. 1 Inhaler 5  . Glycopyrrolate-Formoterol (BEVESPI AEROSPHERE) 9-4.8 MCG/ACT AERO Inhale 2 puffs into the lungs daily. 1 Inhaler 5  . lisinopril-hydrochlorothiazide (PRINZIDE,ZESTORETIC) 20-12.5 MG tablet Take 1 tablet by mouth daily. 30 tablet 3  . Multiple Vitamin (MULTIVITAMIN WITH MINERALS) TABS tablet Take 1 tablet by mouth daily.    . ondansetron (ZOFRAN) 8 MG tablet One pill every 8 hours as needed for nausea/vomitting. 40 tablet 1  . prochlorperazine (COMPAZINE) 10 MG tablet Take 1 tablet (10 mg total) by mouth every 6 (six) hours as needed for nausea or vomiting. 40 tablet 1  . SPIRIVA HANDIHALER 18 MCG inhalation capsule INHALE 1 CAPSULE VIA HANDIHALER ONCE DAILY AT THE SAME TIME EVERY DAY 30 capsule 0  . megestrol (MEGACE ES) 625 MG/5ML  suspension Take 5 mLs (625 mg total) by mouth daily. (Patient not taking: Reported on 02/08/2018) 150 mL 0   No current facility-administered medications for this visit.     PHYSICAL EXAMINATION: ECOG PERFORMANCE STATUS: 1 - Symptomatic but completely ambulatory  BP 112/77 (Patient Position: Sitting)   Pulse (!) 116   Temp 97.6 F (36.4 C) (Tympanic)   Resp 20   Ht '5\' 8"'  (1.727 m)   Wt 140 lb (63.5 kg)   SpO2 97%   BMI 21.29 kg/m   Filed Weights   03/02/18 0935  Weight: 140 lb (63.5 kg)    Physical Exam  Constitutional: He is oriented to person, place, and time and well-developed, well-nourished, and in no distress.  Accompanied by his wife.  HENT:  Head: Normocephalic and atraumatic.  Mouth/Throat: Oropharynx is clear and moist. No oropharyngeal exudate.  Eyes: Pupils are equal, round, and reactive to light.  Neck: Normal range of motion. Neck supple.  Cardiovascular: Normal rate and regular rhythm.  Pulmonary/Chest: No respiratory distress. He has no wheezes.  Decreased air entry bilaterally.  Abdominal: Soft. Bowel sounds are normal. He exhibits no distension and no mass. There is no tenderness.  There is no rebound and no guarding.  Musculoskeletal: Normal range of motion. He exhibits no edema or tenderness.  Neurological: He is alert and oriented to person, place, and time.  Skin: Skin is warm.  Psychiatric: Affect normal.       LABORATORY DATA:  I have reviewed the data as listed    Component Value Date/Time   NA 137 03/02/2018 0900   NA 138 08/24/2017 1651   NA 137 04/23/2013 1330   K 4.1 03/02/2018 0900   K 3.8 04/23/2013 1330   CL 106 03/02/2018 0900   CL 105 04/23/2013 1330   CO2 24 03/02/2018 0900   CO2 22 04/23/2013 1330   GLUCOSE 167 (H) 03/02/2018 0900   GLUCOSE 120 (H) 04/23/2013 1330   BUN 12 03/02/2018 0900   BUN 18 08/24/2017 1651   BUN 18 04/23/2013 1330   CREATININE 0.91 03/02/2018 0900   CREATININE 0.90 04/23/2013 1330   CALCIUM 8.4  (L) 03/02/2018 0900   CALCIUM 9.3 04/23/2013 1330   PROT 7.0 03/02/2018 0900   PROT 6.8 08/24/2017 1651   PROT 7.5 04/23/2013 1330   ALBUMIN 3.8 03/02/2018 0900   ALBUMIN 4.2 08/24/2017 1651   ALBUMIN 3.8 04/23/2013 1330   AST 24 03/02/2018 0900   AST 11 (L) 04/23/2013 1330   ALT 16 03/02/2018 0900   ALT 18 04/23/2013 1330   ALKPHOS 52 03/02/2018 0900   ALKPHOS 64 04/23/2013 1330   BILITOT 0.5 03/02/2018 0900   BILITOT 0.2 08/24/2017 1651   BILITOT 0.6 04/23/2013 1330   GFRNONAA >60 03/02/2018 0900   GFRNONAA >60 04/23/2013 1330   GFRAA >60 03/02/2018 0900   GFRAA >60 04/23/2013 1330    No results found for: SPEP, UPEP  Lab Results  Component Value Date   WBC 4.1 03/02/2018   NEUTROABS 2.8 03/02/2018   HGB 10.9 (L) 03/02/2018   HCT 33.0 (L) 03/02/2018   MCV 101.5 (H) 03/02/2018   PLT 150 03/02/2018      Chemistry      Component Value Date/Time   NA 137 03/02/2018 0900   NA 138 08/24/2017 1651   NA 137 04/23/2013 1330   K 4.1 03/02/2018 0900   K 3.8 04/23/2013 1330   CL 106 03/02/2018 0900   CL 105 04/23/2013 1330   CO2 24 03/02/2018 0900   CO2 22 04/23/2013 1330   BUN 12 03/02/2018 0900   BUN 18 08/24/2017 1651   BUN 18 04/23/2013 1330   CREATININE 0.91 03/02/2018 0900   CREATININE 0.90 04/23/2013 1330      Component Value Date/Time   CALCIUM 8.4 (L) 03/02/2018 0900   CALCIUM 9.3 04/23/2013 1330   ALKPHOS 52 03/02/2018 0900   ALKPHOS 64 04/23/2013 1330   AST 24 03/02/2018 0900   AST 11 (L) 04/23/2013 1330   ALT 16 03/02/2018 0900   ALT 18 04/23/2013 1330   BILITOT 0.5 03/02/2018 0900   BILITOT 0.2 08/24/2017 1651   BILITOT 0.6 04/23/2013 1330       RADIOGRAPHIC STUDIES: I have personally reviewed the radiological images as listed and agreed with the findings in the report. No results found.   ASSESSMENT & PLAN:  Primary cancer of right upper lobe of lung ([place]) #Lung adenocarcinoma-stage IV-metastases- STABLE. S/p carbotaxol plus Avastin plus  Tecentriq cycle # 2;  SEP 2019- Partial response/stable disease.   # Proceed with carbo-Taxol-avastin-Tecentriq # 4 today. Labs today reviewed;  acceptable for treatment today. Will plan CT scan in 1 month; patient  reluctant with chemotherapies.  # Bone mets- X-geva continue-  calcium vitamin D.  Stable  # Cough/SOB- ? COPD on streoids inhlaer [Dr. Ram]; Spiriva/albuterol.  Discussed re: nebulizer.  Wants to hold off for now  # weight loss/dietary referral; recommend using megace at home.    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  # DISPOSITION:  # treatment tomorow # follow up in 4 weeks/labs- NO chemo/MD/ X-geva; CT scan prior.   # I reviewed the blood work- with the patient in detail; also reviewed the imaging independently [as summarized above]; and with the patient in detail.     Orders Placed This Encounter  Procedures  . CT CHEST W CONTRAST    Standing Status:   Future    Standing Expiration Date:   03/03/2019    Order Specific Question:   If indicated for the ordered procedure, I authorize the administration of contrast media per Radiology protocol    Answer:   Yes    Order Specific Question:   Preferred imaging location?    Answer:   [facility]    Order Specific Question:   Radiology Contrast Protocol - do NOT remove file path    Answer:   \\[email or web address]    Order Specific Question:   ** REASON FOR EXAM (FREE TEXT)    Answer:   lung cnacer on chmeo  . CT Abdomen Pelvis W Contrast    Standing Status:   Future    Standing Expiration Date:   03/02/2019    Order Specific Question:   ** REASON FOR EXAM (FREE TEXT)    Answer:   lung cnacer on chemo    Order Specific Question:   If indicated for the ordered procedure, I authorize the administration of contrast media per Radiology protocol    Answer:   Yes    Order Specific Question:   Preferred imaging  location?    Answer:   [facility]    Order Specific Question:   Is Oral Contrast requested for this exam?    Answer:   Yes, Per Radiology protocol    Order Specific Question:   Radiology Contrast Protocol - do NOT remove file path    Answer:   \\[email or web address]   All questions were answered. The patient knows to call the clinic with any problems, questions or concerns.      Cammie Sickle, MD 03/02/2018 9:39 PM

## 2018-03-02 NOTE — Assessment & Plan Note (Addendum)
#  Lung adenocarcinoma-stage IV-metastases- STABLE. S/p carbotaxol plus Avastin plus Tecentriq cycle # 2;  SEP 2019- Partial response/stable disease.   # Proceed with carbo-Taxol-avastin-Tecentriq # 4 today. Labs today reviewed;  acceptable for treatment today. Will plan CT scan in 1 month; patient reluctant with chemotherapies.  # Bone mets- X-geva continue-  calcium vitamin D.  Stable  # Cough/SOB- ? COPD on streoids inhlaer [Dr. Ram]; Spiriva/albuterol.  Discussed re: nebulizer.  Wants to hold off for now  # weight loss/dietary referral; recommend using megace at home.    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  # DISPOSITION:  # treatment tomorow # follow up in 4 weeks/labs- NO chemo/MD/ X-geva; CT scan prior.   # I reviewed the blood work- with the patient in detail; also reviewed the imaging independently [as summarized above]; and with the patient in detail.

## 2018-03-03 ENCOUNTER — Other Ambulatory Visit: Payer: Self-pay | Admitting: Internal Medicine

## 2018-03-03 ENCOUNTER — Inpatient Hospital Stay: Payer: BC Managed Care – PPO

## 2018-03-03 VITALS — BP 118/79 | HR 99 | Temp 98.1°F | Resp 19

## 2018-03-03 DIAGNOSIS — C3411 Malignant neoplasm of upper lobe, right bronchus or lung: Secondary | ICD-10-CM

## 2018-03-03 LAB — PROTEIN, URINE, RANDOM

## 2018-03-03 MED ORDER — SODIUM CHLORIDE 0.9 % IV SOLN
20.0000 mg | Freq: Once | INTRAVENOUS | Status: AC
  Administered 2018-03-03: 20 mg via INTRAVENOUS
  Filled 2018-03-03: qty 2

## 2018-03-03 MED ORDER — SODIUM CHLORIDE 0.9 % IV SOLN
1200.0000 mg | Freq: Once | INTRAVENOUS | Status: AC
  Administered 2018-03-03: 1200 mg via INTRAVENOUS
  Filled 2018-03-03: qty 20

## 2018-03-03 MED ORDER — SODIUM CHLORIDE 0.9 % IV SOLN
Freq: Once | INTRAVENOUS | Status: AC
  Administered 2018-03-03: 09:00:00 via INTRAVENOUS
  Filled 2018-03-03: qty 250

## 2018-03-03 MED ORDER — DIPHENHYDRAMINE HCL 50 MG/ML IJ SOLN
50.0000 mg | Freq: Once | INTRAMUSCULAR | Status: AC
  Administered 2018-03-03: 50 mg via INTRAVENOUS
  Filled 2018-03-03: qty 1

## 2018-03-03 MED ORDER — SODIUM CHLORIDE 0.9 % IV SOLN
15.0000 mg/kg | Freq: Once | INTRAVENOUS | Status: AC
  Administered 2018-03-03: 1000 mg via INTRAVENOUS
  Filled 2018-03-03: qty 32

## 2018-03-03 MED ORDER — SODIUM CHLORIDE 0.9 % IV SOLN
610.0000 mg | Freq: Once | INTRAVENOUS | Status: AC
  Administered 2018-03-03: 610 mg via INTRAVENOUS
  Filled 2018-03-03: qty 61

## 2018-03-03 MED ORDER — SODIUM CHLORIDE 0.9 % IV SOLN
200.0000 mg/m2 | Freq: Once | INTRAVENOUS | Status: AC
  Administered 2018-03-03: 360 mg via INTRAVENOUS
  Filled 2018-03-03: qty 60

## 2018-03-03 MED ORDER — PALONOSETRON HCL INJECTION 0.25 MG/5ML
0.2500 mg | Freq: Once | INTRAVENOUS | Status: AC
  Administered 2018-03-03: 0.25 mg via INTRAVENOUS
  Filled 2018-03-03: qty 5

## 2018-03-03 MED ORDER — FAMOTIDINE IN NACL 20-0.9 MG/50ML-% IV SOLN
20.0000 mg | Freq: Once | INTRAVENOUS | Status: AC
  Administered 2018-03-03: 20 mg via INTRAVENOUS
  Filled 2018-03-03: qty 50

## 2018-03-03 MED ORDER — SODIUM CHLORIDE 0.9 % IV SOLN: 616.2000 mg | Freq: Once | INTRAVENOUS | Status: DC

## 2018-03-03 NOTE — Progress Notes (Signed)
MD note from yesterday says no chemo.  Verified with MD, patient getting chemo today.

## 2018-03-04 ENCOUNTER — Emergency Department: Payer: BC Managed Care – PPO

## 2018-03-04 ENCOUNTER — Encounter: Payer: Self-pay | Admitting: Emergency Medicine

## 2018-03-04 ENCOUNTER — Other Ambulatory Visit: Payer: Self-pay

## 2018-03-04 ENCOUNTER — Inpatient Hospital Stay
Admission: EM | Admit: 2018-03-04 | Discharge: 2018-03-07 | DRG: 193 | Disposition: A | Discharge status: Home / Self Care | Payer: BC Managed Care – PPO | Attending: Internal Medicine | Admitting: Internal Medicine

## 2018-03-04 DIAGNOSIS — Z87891 Personal history of nicotine dependence: Secondary | ICD-10-CM

## 2018-03-04 DIAGNOSIS — J189 Pneumonia, unspecified organism: Principal | ICD-10-CM | POA: Diagnosis present

## 2018-03-04 DIAGNOSIS — Z79899 Other long term (current) drug therapy: Secondary | ICD-10-CM

## 2018-03-04 DIAGNOSIS — E876 Hypokalemia: Secondary | ICD-10-CM | POA: Diagnosis present

## 2018-03-04 DIAGNOSIS — R059 Cough, unspecified: Secondary | ICD-10-CM

## 2018-03-04 DIAGNOSIS — C3411 Malignant neoplasm of upper lobe, right bronchus or lung: Secondary | ICD-10-CM | POA: Diagnosis present

## 2018-03-04 DIAGNOSIS — T451X5A Adverse effect of antineoplastic and immunosuppressive drugs, initial encounter: Secondary | ICD-10-CM | POA: Diagnosis present

## 2018-03-04 DIAGNOSIS — Z7951 Long term (current) use of inhaled steroids: Secondary | ICD-10-CM

## 2018-03-04 DIAGNOSIS — Y95 Nosocomial condition: Secondary | ICD-10-CM | POA: Diagnosis present

## 2018-03-04 DIAGNOSIS — R112 Nausea with vomiting, unspecified: Secondary | ICD-10-CM

## 2018-03-04 DIAGNOSIS — D61810 Antineoplastic chemotherapy induced pancytopenia: Secondary | ICD-10-CM | POA: Diagnosis present

## 2018-03-04 DIAGNOSIS — C787 Secondary malignant neoplasm of liver and intrahepatic bile duct: Secondary | ICD-10-CM | POA: Diagnosis present

## 2018-03-04 DIAGNOSIS — Z923 Personal history of irradiation: Secondary | ICD-10-CM

## 2018-03-04 DIAGNOSIS — C7931 Secondary malignant neoplasm of brain: Secondary | ICD-10-CM | POA: Diagnosis present

## 2018-03-04 DIAGNOSIS — R05 Cough: Secondary | ICD-10-CM

## 2018-03-04 DIAGNOSIS — F419 Anxiety disorder, unspecified: Secondary | ICD-10-CM | POA: Diagnosis present

## 2018-03-04 DIAGNOSIS — I959 Hypotension, unspecified: Secondary | ICD-10-CM | POA: Diagnosis present

## 2018-03-04 DIAGNOSIS — Z96641 Presence of right artificial hip joint: Secondary | ICD-10-CM | POA: Diagnosis present

## 2018-03-04 DIAGNOSIS — Z6822 Body mass index (BMI) 22.0-22.9, adult: Secondary | ICD-10-CM

## 2018-03-04 DIAGNOSIS — R509 Fever, unspecified: Secondary | ICD-10-CM

## 2018-03-04 DIAGNOSIS — A419 Sepsis, unspecified organism: Secondary | ICD-10-CM | POA: Diagnosis present

## 2018-03-04 DIAGNOSIS — R000 Tachycardia, unspecified: Secondary | ICD-10-CM

## 2018-03-04 DIAGNOSIS — E440 Moderate protein-calorie malnutrition: Secondary | ICD-10-CM

## 2018-03-04 LAB — COMPREHENSIVE METABOLIC PANEL
ALBUMIN: 3.5 g/dL (ref 3.5–5.0)
ALT: 15 U/L (ref 0–44)
AST: 28 U/L (ref 15–41)
Alkaline Phosphatase: 49 U/L (ref 38–126)
Anion gap: 9 (ref 5–15)
BUN: 13 mg/dL (ref 8–23)
CHLORIDE: 101 mmol/L (ref 98–111)
CO2: 24 mmol/L (ref 22–32)
Calcium: 8.5 mg/dL — ABNORMAL LOW (ref 8.9–10.3)
Creatinine, Ser: 1.06 mg/dL (ref 0.61–1.24)
GFR calc Af Amer: >60 mL/min (ref >60)
GFR calc non Af Amer: >60 mL/min (ref >60)
GLUCOSE: 116 mg/dL — AB (ref 70–99)
Potassium: 4 mmol/L (ref 3.5–5.1)
Sodium: 134 mmol/L — ABNORMAL LOW (ref 135–145)
Total Bilirubin: 0.9 mg/dL (ref 0.3–1.2)
Total Protein: 6.6 g/dL (ref 6.5–8.1)

## 2018-03-04 LAB — CBC WITH DIFFERENTIAL/PLATELET
ABS IMMATURE GRANULOCYTES: 0.03 10*3/uL (ref 0.00–0.07)
Basophils Absolute: 0 10*3/uL (ref 0.0–0.1)
Basophils Relative: 0 %
Eosinophils Absolute: 0.1 10*3/uL (ref 0.0–0.5)
Eosinophils Relative: 2 %
HEMATOCRIT: 32 % — AB (ref 39.0–52.0)
Hemoglobin: 10.9 g/dL — ABNORMAL LOW (ref 13.0–17.0)
IMMATURE GRANULOCYTES: 1 %
LYMPHS ABS: 0.2 10*3/uL — AB (ref 0.7–4.0)
LYMPHS PCT: 4 %
MCH: 34 pg (ref 26.0–34.0)
MCHC: 34.1 g/dL (ref 30.0–36.0)
MCV: 99.7 fL (ref 80.0–100.0)
MONOS PCT: 4 %
Monocytes Absolute: 0.2 10*3/uL (ref 0.1–1.0)
NEUTROS ABS: 4.6 10*3/uL (ref 1.7–7.7)
Neutrophils Relative %: 89 %
PLATELETS: 171 10*3/uL (ref 150–400)
RBC: 3.21 MIL/uL — ABNORMAL LOW (ref 4.22–5.81)
RDW: 13.9 % (ref 11.5–15.5)
WBC: 5.1 10*3/uL (ref 4.0–10.5)
nRBC: 0 % (ref 0.0–0.2)

## 2018-03-04 LAB — LIPASE, BLOOD: Lipase: 18 U/L (ref 11–51)

## 2018-03-04 LAB — PROTIME-INR
INR: 1.17
Prothrombin Time: 14.8 seconds (ref 11.4–15.2)

## 2018-03-04 LAB — LACTIC ACID, PLASMA: LACTIC ACID, VENOUS: 1.6 mmol/L (ref 0.5–1.9)

## 2018-03-04 LAB — TROPONIN I: TROPONIN I: 0.03 ng/mL — AB (ref <0.03)

## 2018-03-04 MED ORDER — LORAZEPAM 2 MG/ML IJ SOLN
0.5000 mg | Freq: Once | INTRAMUSCULAR | Status: AC
  Administered 2018-03-04: 0.5 mg via INTRAVENOUS
  Filled 2018-03-04: qty 1

## 2018-03-04 MED ORDER — SODIUM CHLORIDE 0.9 % IV SOLN
2.0000 g | Freq: Three times a day (TID) | INTRAVENOUS | Status: DC
  Administered 2018-03-05 – 2018-03-07 (×7): 2 g via INTRAVENOUS
  Filled 2018-03-04 (×9): qty 2

## 2018-03-04 MED ORDER — METRONIDAZOLE IN NACL 5-0.79 MG/ML-% IV SOLN
500.0000 mg | Freq: Three times a day (TID) | INTRAVENOUS | Status: DC
  Administered 2018-03-04 – 2018-03-07 (×8): 500 mg via INTRAVENOUS
  Filled 2018-03-04 (×10): qty 100

## 2018-03-04 MED ORDER — SODIUM CHLORIDE 0.9 % IV BOLUS
500.0000 mL | Freq: Once | INTRAVENOUS | Status: AC
  Administered 2018-03-05: 500 mL via INTRAVENOUS

## 2018-03-04 MED ORDER — VANCOMYCIN HCL IN DEXTROSE 1-5 GM/200ML-% IV SOLN
1000.0000 mg | Freq: Once | INTRAVENOUS | Status: AC
  Administered 2018-03-05: 1000 mg via INTRAVENOUS
  Filled 2018-03-04: qty 200

## 2018-03-04 MED ORDER — SODIUM CHLORIDE 0.9 % IV SOLN
2.0000 g | Freq: Once | INTRAVENOUS | Status: AC
  Administered 2018-03-04: 2 g via INTRAVENOUS
  Filled 2018-03-04: qty 2

## 2018-03-04 MED ORDER — SODIUM CHLORIDE 0.9 % IV BOLUS
500.0000 mL | Freq: Once | INTRAVENOUS | Status: AC
  Administered 2018-03-04: 500 mL via INTRAVENOUS

## 2018-03-04 MED ORDER — ONDANSETRON HCL 4 MG/2ML IJ SOLN
INTRAMUSCULAR | Status: AC
  Administered 2018-03-04: 4 mg via INTRAVENOUS
  Filled 2018-03-04: qty 2

## 2018-03-04 NOTE — ED Provider Notes (Signed)
[facility] Emergency Department Provider Note  ___________________________________________   First MD Initiated Contact with Patient 03/04/18 2157     (approximate)  I have reviewed the triage vital signs and the nursing notes.  HISTORY  Chief Complaint Fever and Tachycardia  HPI James [name] is a 62 y.o. male presents for evaluation of a fever  Patient reports he had chemotherapy yesterday, he often feels sick after chemotherapy he will feel nauseated but today he is felt notably worse and fatigued chills and had a fever at home measured 102.55F.  He endorses that he feels achy, has had a cough but he carries a chronic cough but it seems a little bit worse in the last day or 2.  Some nausea which is currently gone because he is taken Zofran just prior to arrival.  Feels warm, no abdominal pain no diarrhea.  No recent travel except to [place].  No insect or tick bites no skin rashes or other injuries known.  Denies abdominal pain.  Not in any discomfort except for his joints feel achy which she reports is normal for him after chemotherapy.  Feels fatigued, has vomited multiple times which she attributes to chemotherapy in the past but feels worse today.   Past Medical History:  Diagnosis Date  . Cancer ([place]) 12/01/2017   Primary cancer of right upper lobe of lung. Mets to liver, brain.  Marland Kitchen Prepatellar bursitis of left knee    patient denies    Patient Active Problem List   Diagnosis Date Noted  . Cancer, metastatic to bone ([place]) 01/18/2018  . Goals of care, counseling/discussion 12/10/2017  . Primary cancer of right upper lobe of lung ([place]) 11/23/2017  . Mass of upper lobe of right lung 11/08/2017  . Status post total replacement of right hip 04/09/2017  . Unilateral primary osteoarthritis, right hip 02/09/2017  . Chronic right-sided low back pain with left-sided sciatica 02/09/2017  . Pain in right hip 02/09/2017    Past Surgical History:    Procedure Laterality Date  . collapsed lung  2012   "had to reinflate" " i carried large piece of sheet rock up stairs by myself "   . [place]   x2 , second was with mesh   . TOTAL HIP ARTHROPLASTY Right 04/09/2017   Procedure: RIGHT TOTAL HIP ARTHROPLASTY ANTERIOR APPROACH;  Surgeon: Mcarthur Rossetti, MD;  Location: [facility];  Service: Orthopedics;  Laterality: Right;  . URETHRAL STRICTURE DILATATION      Prior to Admission medications   Medication Sig Start Date End Date Taking? Authorizing Provider  albuterol (PROAIR HFA) 108 (90 Base) MCG/ACT inhaler Inhale 1-2 puffs into the lungs every 6 (six) hours as needed for wheezing or shortness of breath. 02/07/18  Yes Laverle Hobby, MD  Glycopyrrolate-Formoterol (BEVESPI AEROSPHERE) 9-4.8 MCG/ACT AERO Inhale 2 puffs into the lungs daily. 02/07/18  Yes Laverle Hobby, MD  lisinopril-hydrochlorothiazide (PRINZIDE,ZESTORETIC) 20-12.5 MG tablet Take 1 tablet by mouth daily. 12/31/17  Yes Cammie Sickle, MD  Multiple Vitamin (MULTIVITAMIN WITH MINERALS) TABS tablet Take 1 tablet by mouth daily.   Yes [provider]  ondansetron (ZOFRAN) 8 MG tablet One pill every 8 hours as needed for nausea/vomitting. Patient taking differently: Take 8 mg by mouth every 8 (eight) hours as needed for vomiting. One pill every 8 hours as needed for nausea/vomitting. 11/27/17  Yes Cammie Sickle, MD  prochlorperazine (COMPAZINE) 10 MG tablet Take 1 tablet (10 mg total) by  mouth every 6 (six) hours as needed for nausea or vomiting. 11/27/17  Yes Brahmanday, Elisha Headland, MD  SPIRIVA HANDIHALER 18 MCG inhalation capsule INHALE 1 CAPSULE VIA HANDIHALER ONCE DAILY AT [place] Patient taking differently: Place 18 mcg into inhaler and inhale daily.  01/31/18  Yes Laverle Hobby, MD  megestrol (MEGACE ES) 625 MG/5ML suspension Take 5 mLs (625 mg total) by mouth daily. Patient not taking: Reported on  02/08/2018 01/11/18   Cammie Sickle, MD    Allergies Patient has no known allergies.  No family history on file.  Social History Social History   Tobacco Use  . Smoking status: Former Smoker    Types: Cigarettes    Last attempt to quit: 03/18/2012    Years since quitting: 5.9  . Smokeless tobacco: Never Used  . Tobacco comment: 5 years   Substance Use Topics  . Alcohol use: Yes    Comment: Socially  . Drug use: Yes    Types: Marijuana    Comment: 1 month ago     Review of Systems Constitutional: Fever and chills and fatigue Eyes: No visual changes. ENT: No sore throat. Cardiovascular: Denies chest pain. Respiratory: Denies shortness of breath. Gastrointestinal: No abdominal pain.  Some nausea and vomiting, currently gone after taking ondansetron at home. Genitourinary: Negative for dysuria. Musculoskeletal: Negative for back pain.  Achiness in all of his major joints which he attributes to chemotherapy. Skin: Negative for rash. Neurological: Negative for headaches, areas of focal weakness or numbness.    ____________________________________________   PHYSICAL EXAM:  VITAL SIGNS: ED Triage Vitals  Enc Vitals Group     BP 03/04/18 2148 106/74     Pulse Rate 03/04/18 2148 (!) 143     Resp 03/04/18 2148 (!) 24     Temp 03/04/18 2148 98.4 F (36.9 C)     Temp Source 03/04/18 2148 Oral     SpO2 03/04/18 2148 92 %     Weight 03/04/18 2149 140 lb (63.5 kg)     Height 03/04/18 2149 5\' 8"  (1.727 m)     Head Circumference --      Peak Flow --      Pain Score 03/04/18 2149 5     Pain Loc --      Pain Edu? --      Excl. in [place]? --     Constitutional: Alert and oriented.  Moderately ill-appearing, very pleasant and in no distress with his family at the bedside.  He is warm to touch and oral temp measured at 99.9 by myself. Eyes: Conjunctivae are normal. Head: Atraumatic. Nose: No congestion/rhinnorhea. Mouth/Throat: Mucous membranes are dry. Neck: No  stridor.  Cardiovascular: Cardiac rate, regular rhythm. Grossly normal heart sounds.  Good peripheral circulation. Respiratory: Normal respiratory effort.  No retractions. Lungs CTAB. Gastrointestinal: Soft and nontender. No distention. Musculoskeletal: No lower extremity tenderness nor edema.  No rashes noted anywhere on the body. Neurologic:  Normal speech and language. No gross focal neurologic deficits are appreciated.  Skin:  Skin is warm, dry and intact. No rash noted. Psychiatric: Mood and affect are normal. Speech and behavior are normal.  ____________________________________________   LABS (all labs ordered are listed, but only abnormal results are displayed)  Labs Reviewed  COMPREHENSIVE METABOLIC PANEL - Abnormal; Notable for the following components:      Result Value   Sodium 134 (*)    Glucose, Bld 116 (*)    Calcium 8.5 (*)  All other components within normal limits  CBC WITH DIFFERENTIAL/PLATELET - Abnormal; Notable for the following components:   RBC 3.21 (*)    Hemoglobin 10.9 (*)    HCT 32.0 (*)    Lymphs Abs 0.2 (*)    All other components within normal limits  TROPONIN I - Abnormal; Notable for the following components:   Troponin I 0.03 (*)    All other components within normal limits  CULTURE, BLOOD (ROUTINE X 2)  CULTURE, BLOOD (ROUTINE X 2)  LACTIC ACID, PLASMA  LIPASE, BLOOD  PROTIME-INR  LACTIC ACID, PLASMA  URINALYSIS, ROUTINE W REFLEX MICROSCOPIC  INFLUENZA PANEL BY PCR (TYPE A & B)   ____________________________________________  EKG  Reviewed and entered by me at 2155 Heart rate 140 QRS 80 QTc 410 Sinus tachycardia, no evidence of acute ischemia.  Suspect some mild T wave changes related to rate. ____________________________________________  RADIOLOGY  Chest x-ray pending at time of admission will be followed up by hospitalist service ____________________________________________   PROCEDURES  Procedure(s) performed:  None  Procedures  Critical Care performed: Yes, see critical care note(s)  CRITICAL CARE Performed by: Delman Kitten   Total critical care time: 37 minutes  Critical care time was exclusive of separately billable procedures and treating other patients.  Critical care was necessary to treat or prevent imminent or life-threatening deterioration.  Critical care was time spent personally by me on the following activities: development of treatment plan with patient and/or surrogate as well as nursing, discussions with consultants, evaluation of patient's response to treatment, examination of patient, obtaining history from patient or surrogate, ordering and performing treatments and interventions, ordering and review of laboratory studies, ordering and review of radiographic studies, pulse oximetry and re-evaluation of patient's condition.  ____________________________________________   INITIAL IMPRESSION / ASSESSMENT AND PLAN / ED COURSE  Pertinent labs & imaging results that were available during my care of the patient were reviewed by me and considered in my medical decision making (see chart for details).     ----------------------------------------- 10:14 PM on 03/04/2018 -----------------------------------------  Based on clinical assessment I am highly suspected the patient may have infectious etiology.  Fever to 102.7 at home, chills nausea and Rigors.  Tachycardia.  Will initiate fluid resuscitation for tachycardia and     Differential diagnosis broad including infectious, reaction to chemotherapy, metabolic, toxic.  All processes considered.  Will evaluate broadly with lab work.  Given the patient's chemotherapy status and fever reported, initiate early antibiotics in the event this is infection treating broad-spectrum with hospital recommended infection for unknown source at this time.  ----------------------------------------- 11:47 PM on  03/04/2018 -----------------------------------------  Patient has received 500 mL bolus, reassessed he is alert and oriented reports feeling improved.  Heart rate down to 125 continuing additional 500 mL normal saline bolus.  He is resting comfortably and reports his nausea and symptoms feel improved.  He does appear improved.  Antibiotics administered.  Discussed with Dr. Jannifer Franklin, patient being admitted.  Chest x-ray result is pending at the time of admission decision discussed with Dr. Jannifer Franklin. ____________________________________________   FINAL CLINICAL IMPRESSION(S) / ED DIAGNOSES  Final diagnoses:  Fever, unspecified fever cause  Tachycardia  Non-intractable vomiting with nausea, unspecified vomiting type        Note:  This document was prepared using Dragon voice recognition software and may include unintentional dictation errors       Delman Kitten, MD 03/08/18 [phone]

## 2018-03-04 NOTE — ED Triage Notes (Signed)
Pt is CA pt (Lung) had last Chemo treatment yesterday reports feeling fatigued fever fluctuating from 101.65F, pt feeling tired reports been having nausea vomiting.

## 2018-03-04 NOTE — Progress Notes (Signed)
CODE SEPSIS - PHARMACY COMMUNICATION  **Broad Spectrum Antibiotics should be administered within 1 hour of Sepsis diagnosis**  Time Code Sepsis Called/Page Received: [phone]  Antibiotics Ordered: [id number] [id number]  Time of 1st antibiotic administration: [id number]  Additional action taken by pharmacy:   If necessary, Name of Provider/Nurse Contacted:     Eloise Harman ,PharmD Clinical Pharmacist  03/04/2018  11:13 PM

## 2018-03-04 NOTE — ED Notes (Signed)
One set of blood cultures did not have labels.  Dr. Jacqualine Code made aware; requests another set of blood cultures.

## 2018-03-05 ENCOUNTER — Inpatient Hospital Stay: Payer: BC Managed Care – PPO

## 2018-03-05 DIAGNOSIS — E876 Hypokalemia: Secondary | ICD-10-CM | POA: Diagnosis present

## 2018-03-05 DIAGNOSIS — Z87891 Personal history of nicotine dependence: Secondary | ICD-10-CM

## 2018-03-05 DIAGNOSIS — A419 Sepsis, unspecified organism: Secondary | ICD-10-CM | POA: Diagnosis present

## 2018-03-05 DIAGNOSIS — Y95 Nosocomial condition: Secondary | ICD-10-CM | POA: Diagnosis present

## 2018-03-05 DIAGNOSIS — E440 Moderate protein-calorie malnutrition: Secondary | ICD-10-CM | POA: Diagnosis present

## 2018-03-05 DIAGNOSIS — F419 Anxiety disorder, unspecified: Secondary | ICD-10-CM | POA: Diagnosis present

## 2018-03-05 DIAGNOSIS — D61810 Antineoplastic chemotherapy induced pancytopenia: Secondary | ICD-10-CM | POA: Diagnosis present

## 2018-03-05 DIAGNOSIS — I959 Hypotension, unspecified: Secondary | ICD-10-CM | POA: Diagnosis present

## 2018-03-05 DIAGNOSIS — T451X5A Adverse effect of antineoplastic and immunosuppressive drugs, initial encounter: Secondary | ICD-10-CM | POA: Diagnosis present

## 2018-03-05 DIAGNOSIS — C787 Secondary malignant neoplasm of liver and intrahepatic bile duct: Secondary | ICD-10-CM | POA: Diagnosis present

## 2018-03-05 DIAGNOSIS — J189 Pneumonia, unspecified organism: Secondary | ICD-10-CM | POA: Diagnosis not present

## 2018-03-05 DIAGNOSIS — Z79899 Other long term (current) drug therapy: Secondary | ICD-10-CM | POA: Diagnosis not present

## 2018-03-05 DIAGNOSIS — C3411 Malignant neoplasm of upper lobe, right bronchus or lung: Secondary | ICD-10-CM | POA: Diagnosis not present

## 2018-03-05 DIAGNOSIS — Z923 Personal history of irradiation: Secondary | ICD-10-CM | POA: Diagnosis not present

## 2018-03-05 DIAGNOSIS — Z7951 Long term (current) use of inhaled steroids: Secondary | ICD-10-CM | POA: Diagnosis not present

## 2018-03-05 DIAGNOSIS — Z96641 Presence of right artificial hip joint: Secondary | ICD-10-CM | POA: Diagnosis present

## 2018-03-05 DIAGNOSIS — Z6822 Body mass index (BMI) 22.0-22.9, adult: Secondary | ICD-10-CM | POA: Diagnosis not present

## 2018-03-05 DIAGNOSIS — C7931 Secondary malignant neoplasm of brain: Secondary | ICD-10-CM | POA: Diagnosis present

## 2018-03-05 LAB — CBC
HCT: 27.4 % — ABNORMAL LOW (ref 39.0–52.0)
Hemoglobin: 9.4 g/dL — ABNORMAL LOW (ref 13.0–17.0)
MCH: 34.3 pg — ABNORMAL HIGH (ref 26.0–34.0)
MCHC: 34.3 g/dL (ref 30.0–36.0)
MCV: 100 fL (ref 80.0–100.0)
NRBC: 0 % (ref 0.0–0.2)
PLATELETS: 122 10*3/uL — AB (ref 150–400)
RBC: 2.74 MIL/uL — AB (ref 4.22–5.81)
RDW: 14 % (ref 11.5–15.5)
WBC: 4.3 10*3/uL (ref 4.0–10.5)

## 2018-03-05 LAB — URINALYSIS, ROUTINE W REFLEX MICROSCOPIC
Bilirubin Urine: NEGATIVE
Glucose, UA: NEGATIVE mg/dL
Hgb urine dipstick: NEGATIVE
Ketones, ur: NEGATIVE mg/dL
Leukocytes, UA: NEGATIVE
NITRITE: NEGATIVE
Protein, ur: NEGATIVE mg/dL
Specific Gravity, Urine: 1.015 (ref 1.005–1.030)
pH: 5 (ref 5.0–8.0)

## 2018-03-05 LAB — BASIC METABOLIC PANEL
ANION GAP: 9 (ref 5–15)
BUN: 15 mg/dL (ref 8–23)
CALCIUM: 7.1 mg/dL — AB (ref 8.9–10.3)
CO2: 21 mmol/L — AB (ref 22–32)
Chloride: 105 mmol/L (ref 98–111)
Creatinine, Ser: 0.89 mg/dL (ref 0.61–1.24)
Glucose, Bld: 106 mg/dL — ABNORMAL HIGH (ref 70–99)
Potassium: 3.6 mmol/L (ref 3.5–5.1)
SODIUM: 135 mmol/L (ref 135–145)

## 2018-03-05 LAB — C DIFFICILE QUICK SCREEN W PCR REFLEX
C DIFFICILE (CDIFF) INTERP: NOT DETECTED
C DIFFICILE (CDIFF) TOXIN: NEGATIVE
C Diff antigen: NEGATIVE

## 2018-03-05 LAB — INFLUENZA PANEL BY PCR (TYPE A & B)
INFLBPCR: NEGATIVE
Influenza A By PCR: NEGATIVE

## 2018-03-05 LAB — LACTIC ACID, PLASMA: Lactic Acid, Venous: 1.3 mmol/L (ref 0.5–1.9)

## 2018-03-05 LAB — PROCALCITONIN: PROCALCITONIN: 0.24 ng/mL

## 2018-03-05 MED ORDER — GUAIFENESIN-DM 100-10 MG/5ML PO SYRP
5.0000 mL | ORAL_SOLUTION | ORAL | Status: DC | PRN
  Administered 2018-03-05 – 2018-03-06 (×2): 5 mL via ORAL
  Filled 2018-03-05 (×4): qty 5

## 2018-03-05 MED ORDER — BENZONATATE 100 MG PO CAPS
200.0000 mg | ORAL_CAPSULE | Freq: Three times a day (TID) | ORAL | Status: DC | PRN
  Administered 2018-03-05 (×2): 200 mg via ORAL
  Filled 2018-03-05 (×2): qty 2

## 2018-03-05 MED ORDER — METOPROLOL TARTRATE 5 MG/5ML IV SOLN: 5.0000 mg | INTRAVENOUS | Status: DC | PRN

## 2018-03-05 MED ORDER — ACETAMINOPHEN 325 MG PO TABS
650.0000 mg | ORAL_TABLET | Freq: Four times a day (QID) | ORAL | Status: DC | PRN
  Administered 2018-03-05 – 2018-03-06 (×2): 650 mg via ORAL
  Filled 2018-03-05 (×2): qty 2

## 2018-03-05 MED ORDER — OXYCODONE HCL 5 MG PO TABS
5.0000 mg | ORAL_TABLET | ORAL | Status: DC | PRN
  Administered 2018-03-05 – 2018-03-06 (×4): 5 mg via ORAL
  Filled 2018-03-05 (×4): qty 1

## 2018-03-05 MED ORDER — ONDANSETRON HCL 4 MG PO TABS
4.0000 mg | ORAL_TABLET | Freq: Four times a day (QID) | ORAL | Status: DC | PRN
  Administered 2018-03-05: 4 mg via ORAL

## 2018-03-05 MED ORDER — TIOTROPIUM BROMIDE MONOHYDRATE 18 MCG IN CAPS
18.0000 ug | ORAL_CAPSULE | Freq: Every day | RESPIRATORY_TRACT | Status: DC
  Administered 2018-03-05 – 2018-03-07 (×3): 18 ug via RESPIRATORY_TRACT
  Filled 2018-03-05: qty 5

## 2018-03-05 MED ORDER — ONDANSETRON HCL 4 MG/2ML IJ SOLN
4.0000 mg | Freq: Four times a day (QID) | INTRAMUSCULAR | Status: DC | PRN
  Administered 2018-03-04 – 2018-03-06 (×4): 4 mg via INTRAVENOUS
  Filled 2018-03-05 (×5): qty 2

## 2018-03-05 MED ORDER — SODIUM CHLORIDE 0.9 % IV SOLN
INTRAVENOUS | Status: AC
  Administered 2018-03-05: 03:00:00 via INTRAVENOUS

## 2018-03-05 MED ORDER — PROCHLORPERAZINE EDISYLATE 10 MG/2ML IJ SOLN
10.0000 mg | Freq: Four times a day (QID) | INTRAMUSCULAR | Status: DC | PRN
  Administered 2018-03-05 – 2018-03-06 (×3): 10 mg via INTRAVENOUS
  Filled 2018-03-05 (×8): qty 2

## 2018-03-05 MED ORDER — ACETAMINOPHEN 650 MG RE SUPP: 650.0000 mg | Freq: Four times a day (QID) | RECTAL | Status: DC | PRN

## 2018-03-05 MED ORDER — SODIUM CHLORIDE 0.9 % IV BOLUS
1000.0000 mL | Freq: Once | INTRAVENOUS | Status: AC
  Administered 2018-03-05: 1000 mL via INTRAVENOUS

## 2018-03-05 MED ORDER — ENOXAPARIN SODIUM 40 MG/0.4ML ~~LOC~~ SOLN
40.0000 mg | SUBCUTANEOUS | Status: DC
  Administered 2018-03-05 – 2018-03-06 (×2): 40 mg via SUBCUTANEOUS
  Filled 2018-03-05 (×2): qty 0.4

## 2018-03-05 MED ORDER — SODIUM CHLORIDE 0.9 % IV SOLN
INTRAVENOUS | Status: DC
  Administered 2018-03-05: 15:00:00 via INTRAVENOUS

## 2018-03-05 MED ORDER — VANCOMYCIN HCL IN DEXTROSE 1-5 GM/200ML-% IV SOLN
1000.0000 mg | Freq: Two times a day (BID) | INTRAVENOUS | Status: DC
  Administered 2018-03-05 – 2018-03-06 (×3): 1000 mg via INTRAVENOUS
  Filled 2018-03-05 (×5): qty 200

## 2018-03-05 NOTE — ED Notes (Signed)
ED TO INPATIENT HANDOFF REPORT  Name/Age/Gender James [name] 62 y.o. male  Code Status Code Status History    Date Active Date Inactive Code Status Order ID Comments User Context   04/09/2017 1103 04/11/2017 1427 Full Code [id number]  Mcarthur Rossetti, MD Inpatient    Advance Directive Documentation     Most Recent Value  Type of Advance Directive  Living will  Pre-existing out of facility DNR order (yellow form or pink MOST form)  -  "MOST" Form in Place?  -      Home/SNF/Other Home  Chief Complaint Fever  Level of Care/Admitting Diagnosis ED Disposition    ED Disposition Condition [place]: [place] [[id number]]  Level of Care: Med-Surg [16]  Diagnosis: Sepsis [facility]) [[id number]]  Admitting Physician: Lance Coon [[id number]]  Attending Physician: Lance Coon [phone]  Estimated length of stay: past midnight tomorrow  Certification:: I certify this patient will need inpatient services for at least 2 midnights  PT Class (Do Not Modify): Inpatient [101]  PT Acc Code (Do Not Modify): Private [1]       Medical History Past Medical History:  Diagnosis Date  . 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  Marland Kitchen Prepatellar bursitis of left knee    patient denies    Allergies No Known Allergies  IV Location/Drains/Wounds Patient Lines/Drains/Airways Status   Active Line/Drains/Airways    Name:   Placement date:   Placement time:   Site:   Days:   Peripheral IV 03/04/18 Right Forearm   03/04/18    2230    Forearm   1   Peripheral IV 03/04/18 Right Forearm   03/04/18    2229    Forearm   1   Incision (Closed) 04/09/17 Hip Right   04/09/17    0835     330          Labs/Imaging Results for orders placed or performed during the hospital encounter of 03/04/18 (from the past 48 hour(s))  Influenza panel by PCR (type A & B)     Status: None   Collection Time: 03/04/18 10:11  PM  Result Value Ref Range   Influenza A By PCR NEGATIVE NEGATIVE   Influenza B By PCR NEGATIVE NEGATIVE    Comment: (NOTE) The Xpert Xpress Flu assay is intended as an aid in the diagnosis of  influenza and should not be used as a sole basis for treatment.  This  assay is FDA approved for nasopharyngeal swab specimens only. Nasal  washings and aspirates are unacceptable for Xpert Xpress Flu testing. Performed at [facility], [address] [place], [place] [address]   Comprehensive metabolic panel     Status: Abnormal   Collection Time: 03/04/18 10:19 PM  Result Value Ref Range   Sodium 134 (L) 135 - 145 mmol/L   Potassium 4.0 3.5 - 5.1 mmol/L   Chloride 101 98 - 111 mmol/L   CO2 24 22 - 32 mmol/L   Glucose, Bld 116 (H) 70 - 99 mg/dL   BUN 13 8 - 23 mg/dL   Creatinine, Ser 1.06 0.61 - 1.24 mg/dL   Calcium 8.5 (L) 8.9 - 10.3 mg/dL   Total Protein 6.6 6.5 - 8.1 g/dL   Albumin 3.5 3.5 - 5.0 g/dL   AST 28 15 - 41 U/L   ALT 15 0 - 44 U/L   Alkaline Phosphatase 49 38 -  126 U/L   Total Bilirubin 0.9 0.3 - 1.2 mg/dL   GFR calc non Af Amer >60 >60 mL/min   GFR calc Af Amer >60 >60 mL/min    Comment: (NOTE) The eGFR has been calculated using the CKD EPI equation. This calculation has not been validated in all clinical situations. eGFR's persistently <60 mL/min signify possible Chronic Kidney Disease.    Anion gap 9 5 - 15    Comment: Performed at [facility], [address] [place], [place] [address]  Lipase, blood     Status: None   Collection Time: 03/04/18 10:19 PM  Result Value Ref Range   Lipase 18 11 - 51 U/L    Comment: Performed at [facility], [address] [place], [place] [address]  CBC WITH DIFFERENTIAL     Status: Abnormal   Collection Time: 03/04/18 10:19 PM  Result Value Ref Range   WBC 5.1 4.0 - 10.5 K/uL   RBC 3.21 (L) 4.22 - 5.81 MIL/uL   Hemoglobin 10.9 (L) 13.0 - 17.0 g/dL   HCT 32.0 (L) 39.0 - 52.0 %   MCV 99.7 80.0 -  100.0 fL   MCH 34.0 26.0 - 34.0 pg   MCHC 34.1 30.0 - 36.0 g/dL   RDW 13.9 11.5 - 15.5 %   Platelets 171 150 - 400 K/uL   nRBC 0.0 0.0 - 0.2 %   Neutrophils Relative % 89 %   Neutro Abs 4.6 1.7 - 7.7 K/uL   Lymphocytes Relative 4 %   Lymphs Abs 0.2 (L) 0.7 - 4.0 K/uL   Monocytes Relative 4 %   Monocytes Absolute 0.2 0.1 - 1.0 K/uL   Eosinophils Relative 2 %   Eosinophils Absolute 0.1 0.0 - 0.5 K/uL   Basophils Relative 0 %   Basophils Absolute 0.0 0.0 - 0.1 K/uL   Immature Granulocytes 1 %   Abs Immature Granulocytes 0.03 0.00 - 0.07 K/uL    Comment: Performed at [facility], [address] [place], [place] [address]  Protime-INR     Status: None   Collection Time: 03/04/18 10:19 PM  Result Value Ref Range   Prothrombin Time 14.8 11.4 - 15.2 seconds   INR 1.17     Comment: Performed at [facility], [address] [place], [place] [address]  Troponin I     Status: Abnormal   Collection Time: 03/04/18 10:19 PM  Result Value Ref Range   Troponin I 0.03 (HH) <0.03 ng/mL    Comment: CRITICAL RESULT CALLED TO, READ BACK BY AND VERIFIED WITH SHERRIE ALLSION AT 2322 03/04/2018.  TFK Performed at [facility], [address] [place], [place] [address]   Lactic acid, plasma     Status: None   Collection Time: 03/05/18 12:00 AM  Result Value Ref Range   Lactic Acid, Venous 1.6 0.5 - 1.9 mmol/L    Comment: Performed at [facility], [address] [place],  [address]   Dg Chest 2 View  Result Date: 03/05/2018 CLINICAL DATA:  Fever EXAM: CHEST - 2 VIEW COMPARISON:  CT 02/04/2018, radiograph 11/01/2017 FINDINGS: No pleural effusion. Emphysematous disease. Coarse chronic interstitial opacity. Increased left upper and peripheral nodular opacity. Decreased nodularity in the right upper lobe. Normal heart size. No pneumothorax. IMPRESSION: 1. Increased peripheral mid to upper lung opacity, slightly nodular in appearance, possible acute  infection or inflammatory process. Lung nodules in this patient with history of lung cancer are also considered. 2. Underlying emphysematous  disease and coarse interstitial disease. Slight decrease nodularity in the right upper lobe since prior radiograph. Electronically Signed   By: Donavan Foil M.D.   On: 03/05/2018 00:16    Pending Labs Unresulted Labs (From admission, onward)    Start     Ordered   03/06/18 1930  Vancomycin, trough  Once-Timed,   STAT     03/05/18 0054   03/04/18 2200  Lactic acid, plasma  Now then every 2 hours,   STAT     03/04/18 2159   03/04/18 2200  Blood Culture (routine x 2)  BLOOD CULTURE X 2,   STAT     03/04/18 2159   03/04/18 2200  Urinalysis, Routine w reflex microscopic  STAT,   STAT     03/04/18 2159   Signed and Held  CBC  (enoxaparin (LOVENOX)    CrCl >/= 30 ml/min)  Once,   R    Comments:  Baseline for enoxaparin therapy IF NOT ALREADY DRAWN.  Notify MD if PLT < 100 K.    Signed and Held   Signed and Held  Creatinine, serum  (enoxaparin (LOVENOX)    CrCl >/= 30 ml/min)  Once,   R    Comments:  Baseline for enoxaparin therapy IF NOT ALREADY DRAWN.    Signed and Held   Signed and Held  Creatinine, serum  (enoxaparin (LOVENOX)    CrCl >/= 30 ml/min)  Weekly,   R    Comments:  while on enoxaparin therapy    Signed and Held   Signed and Held  HIV antibody (Routine Testing)  Once,   R     Signed and Held   Signed and Held  Basic metabolic panel  Tomorrow morning,   R     Signed and Held   Signed and Held  CBC  Tomorrow morning,   R     Signed and Held          Vitals/Pain Today's Vitals   03/05/18 0045 03/05/18 0050 03/05/18 0100 03/05/18 0110  BP:  111/73 103/75 123/79  Pulse:      Resp:  (!) 23 (!) 21 (!) 21  Temp: 99.1 F (37.3 C)     TempSrc: Oral     SpO2:      Weight:      Height:      PainSc:        Isolation Precautions Droplet precaution  Medications Medications  sodium chloride 0.9 % bolus 500 mL (500 mLs Intravenous  New Bag/Given 03/05/18 0030)  metroNIDAZOLE (FLAGYL) IVPB 500 mg (0 mg Intravenous Stopped 03/05/18 0028)  vancomycin (VANCOCIN) IVPB 1000 mg/200 mL premix (1,000 mg Intravenous New Bag/Given 03/05/18 0038)  ceFEPIme (MAXIPIME) 2 g in sodium chloride 0.9 % 100 mL IVPB (has no administration in time range)  vancomycin (VANCOCIN) IVPB 1000 mg/200 mL premix (has no administration in time range)  sodium chloride 0.9 % bolus 500 mL (0 mLs Intravenous Stopped 03/05/18 0028)  LORazepam (ATIVAN) injection 0.5 mg (0.5 mg Intravenous Given 03/04/18 2255)  ceFEPIme (MAXIPIME) 2 g in sodium chloride 0.9 % 100 mL IVPB (0 g Intravenous Stopped 03/05/18 0028)  ondansetron (ZOFRAN) 4 MG/2ML injection (4 mg  Given 03/04/18 2235)    Mobility walks

## 2018-03-05 NOTE — Progress Notes (Signed)
Pharmacy Antibiotic Note  James [name] is a 62 y.o. male admitted on 03/04/2018 with unknown source.  Pharmacy has been consulted for vancomycin and cefepime dosing.  Plan: DW 64kg  Vd 45L kei 0.066 hr-1  T1/2 11 hours Vancomycin 1 gram q 12 hours ordered with stacked dosing. Level before 5th dose. Goal trough 15-20  Cefepime 2 grams q 8 hours ordered  Height: 5\' 8"  (172.7 cm) Weight: 140 lb (63.5 kg) IBW/kg (Calculated) : 68.4  Temp (24hrs), Avg:99 F (37.2 C), Min:98.4 F (36.9 C), Max:99.4 F (37.4 C)  Recent Labs  Lab 03/02/18 0900 03/04/18 2219 03/05/18 0000  WBC 4.1 5.1  --   CREATININE 0.91 1.06  --   LATICACIDVEN  --   --  1.6    Estimated Creatinine Clearance: 63.2 mL/min (by C-G formula based on SCr of 1.06 mg/dL).    No Known Allergies  Antimicrobials this admission: vancomycin cefepime 10/18   >>   Dose adjustments this admission:   Microbiology results: 10/18 BCx: pending   Thank you for allowing pharmacy to be a part of this patient's care.  James [name] S 03/05/2018 12:54 AM

## 2018-03-05 NOTE — H&P (Signed)
[place] [facility] [place] NAME: James [name]    MR#:  [id number]  DATE OF BIRTH:  08/20/1955  DATE OF ADMISSION:  03/04/2018  PRIMARY CARE PHYSICIAN: Valerie Roys, DO   REQUESTING/REFERRING PHYSICIAN: Jacqualine Code, MD  CHIEF COMPLAINT:   Chief Complaint  Patient presents with  . Fever  . Tachycardia    HISTORY OF PRESENT ILLNESS:  James [name]  is a 62 y.o. male who presents with chief complaint as above.  Patient is on active chemo for lung cancer.  He is unable to contribute to the history today, history is given by family at bedside.  Family member states that he received his last round of chemo yesterday.  Today he has been coughing, been very weak, and had several fevers at home.  He came to the ED for evaluation is found here to have likely pneumonia.  He meets sepsis criteria.  Hospitalist were called for admission  PAST MEDICAL HISTORY:   Past Medical History:  Diagnosis Date  . 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  Marland Kitchen Prepatellar bursitis of left knee    patient denies     PAST SURGICAL HISTORY:   Past Surgical History:  Procedure Laterality Date  . collapsed lung  2012   "had to reinflate" " i carried large piece of sheet rock up stairs by myself "   . [place]   x2 , second was with mesh   . TOTAL HIP ARTHROPLASTY Right 04/09/2017   Procedure: RIGHT TOTAL HIP ARTHROPLASTY ANTERIOR APPROACH;  Surgeon: Mcarthur Rossetti, MD;  Location: [facility];  Service: Orthopedics;  Laterality: Right;  . URETHRAL STRICTURE DILATATION       SOCIAL HISTORY:   Social History   Tobacco Use  . Smoking status: Former Smoker    Types: Cigarettes    Last attempt to quit: 03/18/2012    Years since quitting: 5.9  . Smokeless tobacco: Never Used  . Tobacco comment: 5 years   Substance Use Topics  . Alcohol use: Yes    Comment: Socially     FAMILY  HISTORY:  Family history reviewed and is non-contributory   DRUG ALLERGIES:  No Known Allergies  MEDICATIONS AT HOME:   Prior to Admission medications   Medication Sig Start Date End Date Taking? Authorizing Provider  albuterol (PROAIR HFA) 108 (90 Base) MCG/ACT inhaler Inhale 1-2 puffs into the lungs every 6 (six) hours as needed for wheezing or shortness of breath. 02/07/18  Yes Laverle Hobby, MD  Glycopyrrolate-Formoterol (BEVESPI AEROSPHERE) 9-4.8 MCG/ACT AERO Inhale 2 puffs into the lungs daily. 02/07/18  Yes Laverle Hobby, MD  lisinopril-hydrochlorothiazide (PRINZIDE,ZESTORETIC) 20-12.5 MG tablet Take 1 tablet by mouth daily. 12/31/17  Yes Cammie Sickle, MD  Multiple Vitamin (MULTIVITAMIN WITH MINERALS) TABS tablet Take 1 tablet by mouth daily.   Yes [provider]  ondansetron (ZOFRAN) 8 MG tablet One pill every 8 hours as needed for nausea/vomitting. Patient taking differently: Take 8 mg by mouth every 8 (eight) hours as needed for vomiting. One pill every 8 hours as needed for nausea/vomitting. 11/27/17  Yes Cammie Sickle, MD  prochlorperazine (COMPAZINE) 10 MG tablet Take 1 tablet (10 mg total) by mouth every 6 (six) hours as needed for nausea or vomiting. 11/27/17  Yes Cammie Sickle, MD  SPIRIVA HANDIHALER 18 MCG inhalation capsule INHALE 1 CAPSULE VIA HANDIHALER ONCE DAILY  AT [place] Patient taking differently: Place 18 mcg into inhaler and inhale daily.  01/31/18  Yes Laverle Hobby, MD  megestrol (MEGACE ES) 625 MG/5ML suspension Take 5 mLs (625 mg total) by mouth daily. Patient not taking: Reported on 02/08/2018 01/11/18   Cammie Sickle, MD    REVIEW OF SYSTEMS:  Review of Systems  Unable to perform ROS: Acuity of condition     VITAL SIGNS:   Vitals:   03/05/18 0030 03/05/18 0031 03/05/18 0040 03/05/18 0045  BP: (!) 75/56 91/72 106/76   Pulse:      Resp: (!) 24 (!) 25 19   Temp:    99.1 F  (37.3 C)  TempSrc:    Oral  SpO2:      Weight:      Height:       Wt Readings from Last 3 Encounters:  03/04/18 63.5 kg  03/02/18 63.5 kg  02/08/18 65.3 kg    PHYSICAL EXAMINATION:  Physical Exam  Vitals reviewed. Constitutional: He appears well-developed and well-nourished. No distress.  HENT:  Head: Normocephalic and atraumatic.  Mouth/Throat: Oropharynx is clear and moist.  Eyes: Pupils are equal, round, and reactive to light. Conjunctivae and EOM are normal. No scleral icterus.  Neck: Normal range of motion. Neck supple. No JVD present. No thyromegaly present.  Cardiovascular: Regular rhythm and intact distal pulses. Exam reveals no gallop and no friction rub.  No murmur heard. Tachycardic  Respiratory: Effort normal. No respiratory distress. He has no wheezes. He has no rales.  Coarse breath sounds  GI: Soft. Bowel sounds are normal. He exhibits no distension. There is no tenderness.  Musculoskeletal: Normal range of motion. He exhibits no edema.  No arthritis, no gout  Lymphadenopathy:    He has no cervical adenopathy.  Neurological: He is alert. No cranial nerve deficit.  Unable to fully assess due to confusion  Skin: Skin is warm and dry. No rash noted. No erythema.  Psychiatric:  Unable to fully assess due to confusion    LABORATORY PANEL:   CBC Recent Labs  Lab 03/04/18 2219  WBC 5.1  HGB 10.9*  HCT 32.0*  PLT 171   ------------------------------------------------------------------------------------------------------------------  Chemistries  Recent Labs  Lab 03/04/18 2219  NA 134*  K 4.0  CL 101  CO2 24  GLUCOSE 116*  BUN 13  CREATININE 1.06  CALCIUM 8.5*  AST 28  ALT 15  ALKPHOS 49  BILITOT 0.9   ------------------------------------------------------------------------------------------------------------------  Cardiac Enzymes Recent Labs  Lab 03/04/18 2219  TROPONINI 0.03*    ------------------------------------------------------------------------------------------------------------------  RADIOLOGY:  Dg Chest 2 View  Result Date: 03/05/2018 CLINICAL DATA:  Fever EXAM: CHEST - 2 VIEW COMPARISON:  CT 02/04/2018, radiograph 11/01/2017 FINDINGS: No pleural effusion. Emphysematous disease. Coarse chronic interstitial opacity. Increased left upper and peripheral nodular opacity. Decreased nodularity in the right upper lobe. Normal heart size. No pneumothorax. IMPRESSION: 1. Increased peripheral mid to upper lung opacity, slightly nodular in appearance, possible acute infection or inflammatory process. Lung nodules in this patient with history of lung cancer are also considered. 2. Underlying emphysematous disease and coarse interstitial disease. Slight decrease nodularity in the right upper lobe since prior radiograph. Electronically Signed   By: Donavan Foil M.D.   On: 03/05/2018 00:16    EKG:   Orders placed or performed during the hospital encounter of 03/04/18  . ED EKG 12-Lead  . ED EKG 12-Lead    IMPRESSION AND PLAN:  Principal Problem:   Sepsis (  [place]) -broad IV antibiotics, lactic acid within normal limits, blood pressure stable, cause is pneumonia, cultures sent Active Problems:   HCAP (healthcare-associated pneumonia) -IV antibiotics as above, supportive treatment PRN   Primary cancer of right upper lobe of lung ([place]) -patient just finished receiving last chemotherapy dose yesterday.  Monitor blood counts carefully  Chart review performed and case discussed with ED provider. Labs, imaging and/or ECG reviewed by provider and discussed with patient/family. Management plans discussed with the patient and/or family.  DVT PROPHYLAXIS: SubQ lovenox   GI PROPHYLAXIS:  None  ADMISSION STATUS: Inpatient     CODE STATUS: Full Code Status History    Date Active Date Inactive Code Status Order ID Comments User Context   04/09/2017 1103 04/11/2017 1427 Full  Code [id number]  Mcarthur Rossetti, MD Inpatient    Advance Directive Documentation     Most Recent Value  Type of Advance Directive  Living will  Pre-existing out of facility DNR order (yellow form or pink MOST form)  -  "MOST" Form in Place?  -      TOTAL TIME TAKING CARE OF THIS PATIENT: 45 minutes.   Zaiah Credeur [place] 03/05/2018, 12:48 AM  [facility]  Office  [phone]  CC: Primary care physician; Valerie Roys, DO  Note:  This document was prepared using Dragon voice recognition software and may include unintentional dictation errors.

## 2018-03-05 NOTE — Progress Notes (Addendum)
[place] at [facility]                                                                                                                                                                                  Patient Demographics   James [name], is a 62 y.o. male, DOB - 07/05/1955, OIN:[id number]  Admit date - 03/04/2018   Admitting Physician Lance Coon, MD  Outpatient Primary MD for the patient is Park Liter P, DO   LOS - 0  Subjective: Patient admitted with fever diarrhea nausea Noted to have hypotension yesterday Patient states that he is feeling better His chemo was 3 days ago  Review of Systems:   CONSTITUTIONAL: Positive fever. No fatigue, weakness. No weight gain, no weight loss.  EYES: No blurry or double vision.  ENT: No tinnitus. No postnasal drip. No redness of the oropharynx.  RESPIRATORY: Positive cough, no wheeze, no hemoptysis. No dyspnea.  CARDIOVASCULAR: No chest pain. No orthopnea. No palpitations. No syncope.  GASTROINTESTINAL: No nausea, positive vomiting positive diarrhea. No abdominal pain. No melena or hematochezia.  GENITOURINARY: No dysuria or hematuria.  ENDOCRINE: No polyuria or nocturia. No heat or cold intolerance.  HEMATOLOGY: No anemia. No bruising. No bleeding.  INTEGUMENTARY: No rashes. No lesions.  MUSCULOSKELETAL: No arthritis. No swelling. No gout.  NEUROLOGIC: No numbness, tingling, or ataxia. No seizure-type activity.  PSYCHIATRIC: No anxiety. No insomnia. No ADD.    Vitals:   Vitals:   03/05/18 0120 03/05/18 0251 03/05/18 0515 03/05/18 1324  BP: 118/73 120/83 132/87 120/83  Pulse:   (!) 115 (!) 110  Resp: (!) 22 (!) 22 17 20   Temp:  98.9 F (37.2 C) 98.4 F (36.9 C) 97.8 F (36.6 C)  TempSrc:  Oral Oral Oral  SpO2:  100% 92% 94%  Weight:  66.2 kg    Height:  5\' 8"  (1.727 m)      Wt Readings from Last 3 Encounters:  03/05/18 66.2 kg  03/02/18 63.5 kg  02/08/18 65.3 kg     Intake/Output Summary  (Last 24 hours) at 03/05/2018 1513 Last data filed at 03/05/2018 1308 Gross per 24 hour  Intake 921.5 ml  Output 351 ml  Net 570.5 ml    Physical Exam:   GENERAL: Pleasant-appearing in no apparent distress.  HEAD, EYES, EARS, NOSE AND THROAT: Atraumatic, normocephalic. Extraocular muscles are intact. Pupils equal and reactive to light. Sclerae anicteric. No conjunctival injection. No oro-pharyngeal erythema.  NECK: Supple. There is no jugular venous distention. No bruits, no lymphadenopathy, no thyromegaly.  HEART: Regular rate and rhythm,. No murmurs, no rubs, no clicks.  LUNGS: Clear to auscultation bilaterally. No rales or rhonchi.  No wheezes.  ABDOMEN: Soft, flat, nontender, nondistended. Has good bowel sounds. No hepatosplenomegaly appreciated.  EXTREMITIES: No evidence of any cyanosis, clubbing, or peripheral edema.  +2 pedal and radial pulses bilaterally.  NEUROLOGIC: The patient is alert, awake, and oriented x3 with no focal motor or sensory deficits appreciated bilaterally.  SKIN: Moist and warm with no rashes appreciated.  Psych: Not anxious, depressed LN: No inguinal LN enlargement    Antibiotics   Anti-infectives (From admission, onward)   Start     Dose/Rate Route Frequency Ordered Stop   03/05/18 0800  vancomycin (VANCOCIN) IVPB 1000 mg/200 mL premix     1,000 mg 200 mL/hr over 60 Minutes Intravenous Every 12 hours 03/05/18 0054     03/05/18 0600  ceFEPIme (MAXIPIME) 2 g in sodium chloride 0.9 % 100 mL IVPB     2 g 200 mL/hr over 30 Minutes Intravenous Every 8 hours 03/04/18 2315     03/04/18 2215  ceFEPIme (MAXIPIME) 2 g in sodium chloride 0.9 % 100 mL IVPB     2 g 200 mL/hr over 30 Minutes Intravenous  Once 03/04/18 2209 03/05/18 0028   03/04/18 2215  metroNIDAZOLE (FLAGYL) IVPB 500 mg     500 mg 100 mL/hr over 60 Minutes Intravenous Every 8 hours 03/04/18 2209     03/04/18 2215  vancomycin (VANCOCIN) IVPB 1000 mg/200 mL premix     1,000 mg 200 mL/hr over 60  Minutes Intravenous  Once 03/04/18 2209 03/05/18 0138      Medications   Scheduled Meds: . enoxaparin (LOVENOX) injection  40 mg Subcutaneous Q24H  . tiotropium  18 mcg Inhalation Daily   Continuous Infusions: . sodium chloride    . ceFEPime (MAXIPIME) IV Stopped (03/05/18 5361)  . metronidazole Stopped (03/05/18 4431)  . vancomycin Stopped (03/05/18 0959)   PRN Meds:.acetaminophen **OR** acetaminophen, benzonatate, guaiFENesin-dextromethorphan, metoprolol tartrate, ondansetron **OR** ondansetron (ZOFRAN) IV, oxyCODONE, prochlorperazine   Data Review:   Micro Results Recent Results (from the past 240 hour(s))  Blood Culture (routine x 2)     Status: None (Preliminary result)   Collection Time: 03/04/18 10:05 PM  Result Value Ref Range Status   Specimen Description LEFT ANTECUBITAL  Final   Special Requests NONE  Final   Culture   Final    NO GROWTH < 12 HOURS Performed at [facility], [address], [place], [place] [address]    Report Status PENDING  Incomplete  Blood Culture (routine x 2)     Status: None (Preliminary result)   Collection Time: 03/04/18 10:19 PM  Result Value Ref Range Status   Specimen Description BLOOD RIGHT ANTECUBITAL  Final   Special Requests   Final    BOTTLES DRAWN AEROBIC AND ANAEROBIC Blood Culture results may not be optimal due to an excessive volume of blood received in culture bottles   Culture   Final    NO GROWTH < 12 HOURS Performed at [facility], [address] [place], [place] [address]    Report Status PENDING  Incomplete    Radiology Reports Dg Chest 2 View  Result Date: 03/05/2018 CLINICAL DATA:  Fever EXAM: CHEST - 2 VIEW COMPARISON:  CT 02/04/2018, radiograph 11/01/2017 FINDINGS: No pleural effusion. Emphysematous disease. Coarse chronic interstitial opacity. Increased left upper and peripheral nodular opacity. Decreased nodularity in the right upper lobe. Normal heart size. No pneumothorax.  IMPRESSION: 1. Increased peripheral mid to upper lung opacity, slightly nodular in appearance, possible acute infection or inflammatory process. Lung  nodules in this patient with history of lung cancer are also considered. 2. Underlying emphysematous disease and coarse interstitial disease. Slight decrease nodularity in the right upper lobe since prior radiograph. Electronically Signed   By: Donavan Foil M.D.   On: 03/05/2018 00:16   Ct Chest W Contrast  Result Date: 02/04/2018 CLINICAL DATA:  Lung cancer, on chemotherapy, for restaging EXAM: CT CHEST, ABDOMEN, AND PELVIS WITH CONTRAST TECHNIQUE: Multidetector CT imaging of the chest, abdomen and pelvis was performed following the standard protocol during bolus administration of intravenous contrast. CONTRAST:  14mL ISOVUE-300 IOPAMIDOL (ISOVUE-300) INJECTION 61% COMPARISON:  CT abdomen/pelvis dated 12/01/2017. CT chest dated 11/05/2017. FINDINGS: CT CHEST FINDINGS Cardiovascular: Heart is normal in size.  No pericardial effusion. No evidence of thoracic aortic aneurysm. Mild coronary atherosclerosis of the LAD. Mediastinum/Nodes: Mediastinal lymphadenopathy, mildly improved, including: --1.6 cm short axis AP window node (series 2/image 25), previously 1.8 cm --1.3 cm short axis right azygoesophageal recess node (series 2/image 32), previously 1.6 cm Visualized thyroid is unremarkable. Lungs/Pleura: Soft tissue thickening along the right upper lobe bronchus (series 4/image 64), compatible with the patient's known primary bronchogenic neoplasm, improved. 1.2 x 2.3 cm nodular opacity in the lateral right lung apex (series 4/image 38), previously 1.4 x 2.1 cm, grossly unchanged. Peribronchovascular and subpleural nodularity in the lungs bilaterally,, including focal patchy opacity in the posterior right lower lobe (series 4/images 121 and 133). This appearance favors multifocal tumor in this patient, although infection/inflammation is technically also possible.  No pleural effusion or pneumothorax. Musculoskeletal: Stable multifocal sclerotic metastases, including a dominant lesion in the T4 vertebral body. CT ABDOMEN PELVIS FINDINGS Hepatobiliary: Multifocal hepatic lesions, favoring metastases, including a dominant 3.0 x 2.3 cm lesion inferiorly in segment 6 (series 2/image 71), previously 3.7 x 2.3 cm when measured in a similar fashion. Adjacent 11 mm lesion anteriorly/inferiorly (series 2/image 34), previously 14 mm. Gallbladder is unremarkable. No intrahepatic or extrahepatic ductal dilatation. Pancreas: Within normal limits. Spleen: Within normal limits. Adrenals/Urinary Tract: Adrenal glands within normal limits. Right upper pole renal cysts measuring up to 2.1 cm. Left kidney is within normal limits. No hydronephrosis. Bladder is thick-walled although underdistended. Stomach/Bowel: Stomach is within normal limits. No evidence of bowel obstruction. Normal appendix (series 2/image 99). Left colonic diverticulosis, without evidence of diverticulitis. Vascular/Lymphatic: No evidence of abdominal aortic aneurysm. Atherosclerotic calcifications of the abdominal aorta and branch vessels. No suspicious abdominopelvic lymphadenopathy. Reproductive: Prostate is unremarkable. Other: No abdominopelvic ascites. Postsurgical changes related to prior ventral/bilateral inguinal hernia repair. Tiny fat containing bilateral inguinal hernias. Musculoskeletal: Multifocal sclerotic osseous metastases, including at L2 and L3, grossly unchanged. Right hip arthroplasty, without evidence of complication. IMPRESSION: Soft tissue thickening along the right upper lobe bronchus, compatible with the patient's known primary bronchogenic neoplasm. Additional peribronchovascular and patchy opacities in the lungs bilaterally, likely reflecting multifocal tumor in this patient, although technically infection is also possible. Improving thoracic nodal metastases.  Improving hepatic metastases.  Multifocal sclerotic osseous metastases, grossly unchanged. Electronically Signed   By: Julian Hy M.D.   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 EXAM: MRI HEAD WITHOUT AND WITH CONTRAST TECHNIQUE: Multiplanar, multiecho pulse sequences of the brain and surrounding structures were obtained without and with intravenous contrast. CONTRAST:  75mL MULTIHANCE GADOBENATE DIMEGLUMINE 529 MG/ML IV SOLN COMPARISON:  11/17/2017 FINDINGS: Brain: Treated right occipital lobe metastasis is now mildly enhancing and significantly decreased  in size (15 mm in maximum as compared to 26 mm previously). No residual vasogenic edema. No new or untreated metastasis is seen. No infarct, hemorrhage, hydrocephalus, or collection. Vascular: Major flow voids and vascular enhancements are preserved Skull and upper cervical spine: Subcentimeter structure in the anterior dens is stable. Sinuses/Orbits: Negative IMPRESSION: 1. Treated right occipital lobe metastasis with positive treatment response. 2. No new metastasis. Electronically Signed   By: Monte Fantasia M.D.   On: 02/24/2018 14:56   Ct Abdomen Pelvis W Contrast  Result Date: 02/04/2018 CLINICAL DATA:  Lung cancer, on chemotherapy, for restaging EXAM: CT CHEST, ABDOMEN, AND PELVIS WITH CONTRAST TECHNIQUE: Multidetector CT imaging of the chest, abdomen and pelvis was performed following the standard protocol during bolus administration of intravenous contrast. CONTRAST:  151mL ISOVUE-300 IOPAMIDOL (ISOVUE-300) INJECTION 61% COMPARISON:  CT abdomen/pelvis dated 12/01/2017. CT chest dated 11/05/2017. FINDINGS: CT CHEST FINDINGS Cardiovascular: Heart is normal in size.  No pericardial effusion. No evidence of thoracic aortic aneurysm. Mild coronary atherosclerosis of the LAD. Mediastinum/Nodes: Mediastinal lymphadenopathy, mildly improved, including: --1.6  cm short axis AP window node (series 2/image 25), previously 1.8 cm --1.3 cm short axis right azygoesophageal recess node (series 2/image 32), previously 1.6 cm Visualized thyroid is unremarkable. Lungs/Pleura: Soft tissue thickening along the right upper lobe bronchus (series 4/image 64), compatible with the patient's known primary bronchogenic neoplasm, improved. 1.2 x 2.3 cm nodular opacity in the lateral right lung apex (series 4/image 38), previously 1.4 x 2.1 cm, grossly unchanged. Peribronchovascular and subpleural nodularity in the lungs bilaterally,, including focal patchy opacity in the posterior right lower lobe (series 4/images 121 and 133). This appearance favors multifocal tumor in this patient, although infection/inflammation is technically also possible. No pleural effusion or pneumothorax. Musculoskeletal: Stable multifocal sclerotic metastases, including a dominant lesion in the T4 vertebral body. CT ABDOMEN PELVIS FINDINGS Hepatobiliary: Multifocal hepatic lesions, favoring metastases, including a dominant 3.0 x 2.3 cm lesion inferiorly in segment 6 (series 2/image 71), previously 3.7 x 2.3 cm when measured in a similar fashion. Adjacent 11 mm lesion anteriorly/inferiorly (series 2/image 34), previously 14 mm. Gallbladder is unremarkable. No intrahepatic or extrahepatic ductal dilatation. Pancreas: Within normal limits. Spleen: Within normal limits. Adrenals/Urinary Tract: Adrenal glands within normal limits. Right upper pole renal cysts measuring up to 2.1 cm. Left kidney is within normal limits. No hydronephrosis. Bladder is thick-walled although underdistended. Stomach/Bowel: Stomach is within normal limits. No evidence of bowel obstruction. Normal appendix (series 2/image 99). Left colonic diverticulosis, without evidence of diverticulitis. Vascular/Lymphatic: No evidence of abdominal aortic aneurysm. Atherosclerotic calcifications of the abdominal aorta and branch vessels. No suspicious  abdominopelvic lymphadenopathy. Reproductive: Prostate is unremarkable. Other: No abdominopelvic ascites. Postsurgical changes related to prior ventral/bilateral inguinal hernia repair. Tiny fat containing bilateral inguinal hernias. Musculoskeletal: Multifocal sclerotic osseous metastases, including at L2 and L3, grossly unchanged. Right hip arthroplasty, without evidence of complication. IMPRESSION: Soft tissue thickening along the right upper lobe bronchus, compatible with the patient's known primary bronchogenic neoplasm. Additional peribronchovascular and patchy opacities in the lungs bilaterally, likely reflecting multifocal tumor in this patient, although technically infection is also possible. Improving thoracic nodal metastases.  Improving hepatic metastases. Multifocal sclerotic osseous metastases, grossly unchanged. Electronically Signed   By: Julian Hy M.D.   On: 02/04/2018 11:52     CBC Recent Labs  Lab 03/02/18 0900 03/04/18 2219 03/05/18 0656  WBC 4.1 5.1 4.3  HGB 10.9* 10.9* 9.4*  HCT 33.0* 32.0* 27.4*  PLT 150 171 122*  MCV 101.5*  99.7 100.0  MCH 33.5 34.0 34.3*  MCHC 33.0 34.1 34.3  RDW 14.3 13.9 14.0  LYMPHSABS 0.7 0.2*  --   MONOABS 0.4 0.2  --   EOSABS 0.1 0.1  --   BASOSABS 0.0 0.0  --     Chemistries  Recent Labs  Lab 03/02/18 0900 03/04/18 2219 03/05/18 0656  NA 137 134* 135  K 4.1 4.0 3.6  CL 106 101 105  CO2 24 24 21*  GLUCOSE 167* 116* 106*  BUN 12 13 15   CREATININE 0.91 1.06 0.89  CALCIUM 8.4* 8.5* 7.1*  AST 24 28  --   ALT 16 15  --   ALKPHOS 52 49  --   BILITOT 0.5 0.9  --    ------------------------------------------------------------------------------------------------------------------ estimated creatinine clearance is 78.5 mL/min (by C-G formula based on SCr of 0.89 mg/dL). ------------------------------------------------------------------------------------------------------------------ No results for input(s): HGBA1C in the last  72 hours. ------------------------------------------------------------------------------------------------------------------ No results for input(s): CHOL, HDL, LDLCALC, TRIG, CHOLHDL, LDLDIRECT in the last 72 hours. ------------------------------------------------------------------------------------------------------------------ No results for input(s): TSH, T4TOTAL, T3FREE, THYROIDAB in the last 72 hours.  Invalid input(s): FREET3 ------------------------------------------------------------------------------------------------------------------ No results for input(s): VITAMINB12, FOLATE, FERRITIN, TIBC, IRON, RETICCTPCT in the last 72 hours.  Coagulation profile Recent Labs  Lab 03/04/18 2219  INR 1.17    No results for input(s): DDIMER in the last 72 hours.  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    Oncology consult placed  #4 misc: Lovenox for DVT prophylaxis      Code Status Orders  (From admission, onward)         Start     Ordered   03/05/18 0225  Full code  Continuous     03/05/18 0224        Code Status History    Date Active Date Inactive Code Status Order ID Comments User Context   04/09/2017 1103 04/11/2017 1427 Full Code [id number]  Mcarthur Rossetti, MD Inpatient    Advance Directive Documentation     Most Recent Value  Type of Advance Directive  Living will  Pre-existing out of  facility DNR order (yellow form or pink MOST form)  -  "MOST" Form in Place?  -           Consults  rao  DVT Prophylaxis  Lovenox   Lab Results  Component Value Date   PLT 122 (L) 03/05/2018     Time Spent in minutes  2min 12 PM 1245pm greater than 50% of time spent in care coordination and counseling patient regarding the condition and plan of care.   Dustin Flock M.D on 03/05/2018 at 3:13 PM  Between 7am to 6pm - Pager - [phone]  After 6pm go to [email or web address] - Proofreader  [employer]   Office  [phone]

## 2018-03-05 NOTE — Consult Note (Signed)
Hematology/Oncology Consult note [facility] [phone] [phone]  Patient Care Team: Valerie Roys, DO as PCP - General (Family Medicine) Telford Nab, RN as Registered Nurse Cammie Sickle, MD as Medical Oncologist (Medical Oncology)   Name of the patient: James [name]  [id number]  1955/07/27    Reason for consult: h/o stage IV lung cancer admitetd for pneumonia   Requesting physician: Dr. Posey Pronto  Date of visit: 03/05/2018    History of presenting illness-patient is a 62 year old male with stage IV lung cancer status post 4 cycles of carbotaxol Tecentriq and  Avastin.  Last treatment was on 03/02/2018.  He sees Dr. Lynett Fish as an outpatient and plan was to follow-up with him in 1 month's time with scans prior.  Patient's wife called me yesterday night stating that patient has not been feeling well and has been having intermittent fevers as high as 101.  I had advised her to bring  the patient to the ER which she did.  Patient was found to have healthcare associated pneumonia and is currently on IV antibiotics as well as IV fluids.  He reports feeling overall better since admission.  Patient has been afebrile since admission  ECOG PS- 2  Pain scale- 0   Review of systems- Review of Systems  Constitutional: Positive for chills, fever and malaise/fatigue. Negative for weight loss.  HENT: Negative for congestion, ear discharge and nosebleeds.   Eyes: Negative for blurred vision.  Respiratory: Negative for cough, hemoptysis, sputum production, shortness of breath and wheezing.   Cardiovascular: Negative for chest pain, palpitations, orthopnea and claudication.  Gastrointestinal: Positive for nausea. Negative for abdominal pain, blood in stool, constipation, diarrhea, heartburn, melena and vomiting.  Genitourinary: Negative for dysuria, flank pain, frequency, hematuria and urgency.  Musculoskeletal: Negative for back pain, joint  pain and myalgias.  Skin: Negative for rash.  Neurological: Negative for dizziness, tingling, focal weakness, seizures, weakness and headaches.  Endo/Heme/Allergies: Does not bruise/bleed easily.  Psychiatric/Behavioral: Negative for depression and suicidal ideas. The patient does not have insomnia.     No Known Allergies  Patient Active Problem List   Diagnosis Date Noted  . Sepsis ([place]) 03/05/2018  . HCAP (healthcare-associated pneumonia) 03/05/2018  . Cancer, metastatic to bone ([place]) 01/18/2018  . Goals of care, counseling/discussion 12/10/2017  . Primary cancer of right upper lobe of lung ([place]) 11/23/2017  . Mass of upper lobe of right lung 11/08/2017  . Status post total replacement of right hip 04/09/2017  . Unilateral primary osteoarthritis, right hip 02/09/2017  . Chronic right-sided low back pain with left-sided sciatica 02/09/2017  . Pain in right hip 02/09/2017     Past Medical History:  Diagnosis Date  . Cancer ([place]) 12/01/2017   Primary cancer of right upper lobe of lung. Mets to liver, brain.  Marland Kitchen Prepatellar bursitis of left knee    patient denies     Past Surgical History:  Procedure Laterality Date  . collapsed lung  2012   "had to reinflate" " i carried large piece of sheet rock up stairs by myself "   . [place]   x2 , second was with mesh   . TOTAL HIP ARTHROPLASTY Right 04/09/2017   Procedure: RIGHT TOTAL HIP ARTHROPLASTY ANTERIOR APPROACH;  Surgeon: Mcarthur Rossetti, MD;  Location: [facility];  Service: Orthopedics;  Laterality: Right;  . URETHRAL STRICTURE DILATATION      Social History   Socioeconomic History  . Marital status: Married  Spouse name: Not on file  . Number of children: Not on file  . Years of education: Not on file  . Highest education level: Not on file  Occupational History  . Not on file  Social Needs  . Financial resource strain: Not on file  . Food insecurity:    Worry: Not on file     Inability: Not on file  . Transportation needs:    Medical: Not on file    Non-medical: Not on file  Tobacco Use  . Smoking status: Former Smoker    Types: Cigarettes    Last attempt to quit: 03/18/2012    Years since quitting: 5.9  . Smokeless tobacco: Never Used  . Tobacco comment: 5 years   Substance and Sexual Activity  . Alcohol use: Yes    Comment: Socially  . Drug use: Yes    Types: Marijuana    Comment: 1 month ago   . Sexual activity: Not Currently  Lifestyle  . Physical activity:    Days per week: Not on file    Minutes per session: Not on file  . Stress: Not on file  Relationships  . Social connections:    Talks on phone: Not on file    Gets together: Not on file    Attends religious service: Not on file    Active member of club or organization: Not on file    Attends meetings of clubs or organizations: Not on file    Relationship status: Not on file  . Intimate partner violence:    Fear of current or ex partner: Not on file    Emotionally abused: Not on file    Physically abused: Not on file    Forced sexual activity: Not on file  Other Topics Concern  . Not on file  Social History Narrative   Father and mother with hypertension.      No family history on file.   Current Facility-Administered Medications:  .  0.9 %  sodium chloride infusion, , Intravenous, Continuous, Dustin Flock, MD .  acetaminophen (TYLENOL) tablet 650 mg, 650 mg, Oral, Q6H PRN **OR** acetaminophen (TYLENOL) suppository 650 mg, 650 mg, Rectal, Q6H PRN, Lance Coon, MD .  benzonatate (TESSALON) capsule 200 mg, 200 mg, Oral, TID PRN, Lance Coon, MD, 200 mg at 03/05/18 0858 .  ceFEPIme (MAXIPIME) 2 g in sodium chloride 0.9 % 100 mL IVPB, 2 g, Intravenous, Q8H, Lance Coon, MD, Stopped at 03/05/18 [phone] .  enoxaparin (LOVENOX) injection 40 mg, 40 mg, Subcutaneous, Q24H, Lance Coon, MD .  guaiFENesin-dextromethorphan (ROBITUSSIN DM) 100-10 MG/5ML syrup 5 mL, 5 mL, Oral, Q4H PRN,  Lance Coon, MD .  metoprolol tartrate (LOPRESSOR) injection 5 mg, 5 mg, Intravenous, Q4H PRN, Harrie Foreman, MD .  metroNIDAZOLE (FLAGYL) IVPB 500 mg, 500 mg, Intravenous, Q8H, Lance Coon, MD, Stopped at 03/05/18 [phone] .  ondansetron (ZOFRAN) tablet 4 mg, 4 mg, Oral, Q6H PRN, 4 mg at 03/05/18 0421 **OR** ondansetron (ZOFRAN) injection 4 mg, 4 mg, Intravenous, Q6H PRN, Lance Coon, MD, 4 mg at 03/04/18 2235 .  oxyCODONE (Oxy IR/ROXICODONE) immediate release tablet 5 mg, 5 mg, Oral, Q4H PRN, Lance Coon, MD, 5 mg at 03/05/18 0858 .  prochlorperazine (COMPAZINE) injection 10 mg, 10 mg, Intravenous, Q6H PRN, Harrie Foreman, MD, 10 mg at 03/05/18 0858 .  tiotropium (SPIRIVA) inhalation capsule ([facility] use ONLY) 18 mcg, 18 mcg, Inhalation, Daily, Lance Coon, MD, 18 mcg at 03/05/18 0840 .  vancomycin (VANCOCIN) IVPB 1000  mg/200 mL premix, 1,000 mg, Intravenous, Q12H, Delman Kitten, MD, Stopped at 03/05/18 0959   Physical exam:  Vitals:   03/05/18 0120 03/05/18 0251 03/05/18 0515 03/05/18 1324  BP: 118/73 120/83 132/87 120/83  Pulse:   (!) 115 (!) 110  Resp: (!) 22 (!) 22 17 20   Temp:  98.9 F (37.2 C) 98.4 F (36.9 C) 97.8 F (36.6 C)  TempSrc:  Oral Oral Oral  SpO2:  100% 92% 94%  Weight:  146 lb (66.2 kg)    Height:  5\' 8"  (1.727 m)     Physical Exam  Constitutional: He is oriented to person, place, and time. He appears well-developed and well-nourished.  He appears fatigued  HENT:  Head: Normocephalic and atraumatic.  Eyes: Pupils are equal, round, and reactive to light. EOM are normal.  Neck: Normal range of motion.  Cardiovascular: Regular rhythm and normal heart sounds.  Tachycardic  Pulmonary/Chest: Effort normal and breath sounds normal.  Abdominal: Soft. Bowel sounds are normal.  Musculoskeletal: He exhibits no edema.  Neurological: He is alert and oriented to person, place, and time.  Skin: Skin is warm and dry.       CMP Latest Ref Rng & Units  03/05/2018  Glucose 70 - 99 mg/dL 106(H)  BUN 8 - 23 mg/dL 15  Creatinine 0.61 - 1.24 mg/dL 0.89  Sodium 135 - 145 mmol/L 135  Potassium 3.5 - 5.1 mmol/L 3.6  Chloride 98 - 111 mmol/L 105  CO2 22 - 32 mmol/L 21(L)  Calcium 8.9 - 10.3 mg/dL 7.1(L)  Total Protein 6.5 - 8.1 g/dL -  Total Bilirubin 0.3 - 1.2 mg/dL -  Alkaline Phos 38 - 126 U/L -  AST 15 - 41 U/L -  ALT 0 - 44 U/L -   CBC Latest Ref Rng & Units 03/05/2018  WBC 4.0 - 10.5 K/uL 4.3  Hemoglobin 13.0 - 17.0 g/dL 9.4(L)  Hematocrit 39.0 - 52.0 % 27.4(L)  Platelets 150 - 400 K/uL 122(L)    @IMAGES @  Dg Chest 2 View  Result Date: 03/05/2018 CLINICAL DATA:  Fever EXAM: CHEST - 2 VIEW COMPARISON:  CT 02/04/2018, radiograph 11/01/2017 FINDINGS: No pleural effusion. Emphysematous disease. Coarse chronic interstitial opacity. Increased left upper and peripheral nodular opacity. Decreased nodularity in the right upper lobe. Normal heart size. No pneumothorax. IMPRESSION: 1. Increased peripheral mid to upper lung opacity, slightly nodular in appearance, possible acute infection or inflammatory process. Lung nodules in this patient with history of lung cancer are also considered. 2. Underlying emphysematous disease and coarse interstitial disease. Slight decrease nodularity in the right upper lobe since prior radiograph. Electronically Signed   By: Donavan Foil M.D.   On: 03/05/2018 00:16   Ct Chest W Contrast  Result Date: 02/04/2018 CLINICAL DATA:  Lung cancer, on chemotherapy, for restaging EXAM: CT CHEST, ABDOMEN, AND PELVIS WITH CONTRAST TECHNIQUE: Multidetector CT imaging of the chest, abdomen and pelvis was performed following the standard protocol during bolus administration of intravenous contrast. CONTRAST:  133mL ISOVUE-300 IOPAMIDOL (ISOVUE-300) INJECTION 61% COMPARISON:  CT abdomen/pelvis dated 12/01/2017. CT chest dated 11/05/2017. FINDINGS: CT CHEST FINDINGS Cardiovascular: Heart is normal in size.  No pericardial  effusion. No evidence of thoracic aortic aneurysm. Mild coronary atherosclerosis of the LAD. Mediastinum/Nodes: Mediastinal lymphadenopathy, mildly improved, including: --1.6 cm short axis AP window node (series 2/image 25), previously 1.8 cm --1.3 cm short axis right azygoesophageal recess node (series 2/image 32), previously 1.6 cm Visualized thyroid is unremarkable. Lungs/Pleura: Soft tissue thickening along the  right upper lobe bronchus (series 4/image 64), compatible with the patient's known primary bronchogenic neoplasm, improved. 1.2 x 2.3 cm nodular opacity in the lateral right lung apex (series 4/image 38), previously 1.4 x 2.1 cm, grossly unchanged. Peribronchovascular and subpleural nodularity in the lungs bilaterally,, including focal patchy opacity in the posterior right lower lobe (series 4/images 121 and 133). This appearance favors multifocal tumor in this patient, although infection/inflammation is technically also possible. No pleural effusion or pneumothorax. Musculoskeletal: Stable multifocal sclerotic metastases, including a dominant lesion in the T4 vertebral body. CT ABDOMEN PELVIS FINDINGS Hepatobiliary: Multifocal hepatic lesions, favoring metastases, including a dominant 3.0 x 2.3 cm lesion inferiorly in segment 6 (series 2/image 71), previously 3.7 x 2.3 cm when measured in a similar fashion. Adjacent 11 mm lesion anteriorly/inferiorly (series 2/image 34), previously 14 mm. Gallbladder is unremarkable. No intrahepatic or extrahepatic ductal dilatation. Pancreas: Within normal limits. Spleen: Within normal limits. Adrenals/Urinary Tract: Adrenal glands within normal limits. Right upper pole renal cysts measuring up to 2.1 cm. Left kidney is within normal limits. No hydronephrosis. Bladder is thick-walled although underdistended. Stomach/Bowel: Stomach is within normal limits. No evidence of bowel obstruction. Normal appendix (series 2/image 99). Left colonic diverticulosis, without evidence  of diverticulitis. Vascular/Lymphatic: No evidence of abdominal aortic aneurysm. Atherosclerotic calcifications of the abdominal aorta and branch vessels. No suspicious abdominopelvic lymphadenopathy. Reproductive: Prostate is unremarkable. Other: No abdominopelvic ascites. Postsurgical changes related to prior ventral/bilateral inguinal hernia repair. Tiny fat containing bilateral inguinal hernias. Musculoskeletal: Multifocal sclerotic osseous metastases, including at L2 and L3, grossly unchanged. Right hip arthroplasty, without evidence of complication. IMPRESSION: Soft tissue thickening along the right upper lobe bronchus, compatible with the patient's known primary bronchogenic neoplasm. Additional peribronchovascular and patchy opacities in the lungs bilaterally, likely reflecting multifocal tumor in this patient, although technically infection is also possible. Improving thoracic nodal metastases.  Improving hepatic metastases. Multifocal sclerotic osseous metastases, grossly unchanged. Electronically Signed   By: Julian Hy M.D.   On: 02/04/2018 11:52   Mr Jeri Cos ZO Contrast  Result Date: 02/24/2018 CLINICAL DATA:  Right occipital brain metastasis treated with radiotherapy in June 2019. EXAM: MRI HEAD WITHOUT AND WITH CONTRAST TECHNIQUE: Multiplanar, multiecho pulse sequences of the brain and surrounding structures were obtained without and with intravenous contrast. CONTRAST:  20mL MULTIHANCE GADOBENATE DIMEGLUMINE 529 MG/ML IV SOLN COMPARISON:  11/17/2017 FINDINGS: Brain: Treated right occipital lobe metastasis is now mildly enhancing and significantly decreased in size (15 mm in maximum as compared to 26 mm previously). No residual vasogenic edema. No new or untreated metastasis is seen. No infarct, hemorrhage, hydrocephalus, or collection. Vascular: Major flow voids and vascular enhancements are preserved Skull and upper cervical spine: Subcentimeter structure in the anterior dens is stable.  Sinuses/Orbits: Negative IMPRESSION: 1. Treated right occipital lobe metastasis with positive treatment response. 2. No new metastasis. Electronically Signed   By: Monte Fantasia M.D.   On: 02/24/2018 14:56   Ct Abdomen Pelvis W Contrast  Result Date: 02/04/2018 CLINICAL DATA:  Lung cancer, on chemotherapy, for restaging EXAM: CT CHEST, ABDOMEN, AND PELVIS WITH CONTRAST TECHNIQUE: Multidetector CT imaging of the chest, abdomen and pelvis was performed following the standard protocol during bolus administration of intravenous contrast. CONTRAST:  164mL ISOVUE-300 IOPAMIDOL (ISOVUE-300) INJECTION 61% COMPARISON:  CT abdomen/pelvis dated 12/01/2017. CT chest dated 11/05/2017. FINDINGS: CT CHEST FINDINGS Cardiovascular: Heart is normal in size.  No pericardial effusion. No evidence of thoracic aortic aneurysm. Mild coronary atherosclerosis of the LAD. Mediastinum/Nodes: Mediastinal lymphadenopathy, mildly improved,  including: --1.6 cm short axis AP window node (series 2/image 25), previously 1.8 cm --1.3 cm short axis right azygoesophageal recess node (series 2/image 32), previously 1.6 cm Visualized thyroid is unremarkable. Lungs/Pleura: Soft tissue thickening along the right upper lobe bronchus (series 4/image 64), compatible with the patient's known primary bronchogenic neoplasm, improved. 1.2 x 2.3 cm nodular opacity in the lateral right lung apex (series 4/image 38), previously 1.4 x 2.1 cm, grossly unchanged. Peribronchovascular and subpleural nodularity in the lungs bilaterally,, including focal patchy opacity in the posterior right lower lobe (series 4/images 121 and 133). This appearance favors multifocal tumor in this patient, although infection/inflammation is technically also possible. No pleural effusion or pneumothorax. Musculoskeletal: Stable multifocal sclerotic metastases, including a dominant lesion in the T4 vertebral body. CT ABDOMEN PELVIS FINDINGS Hepatobiliary: Multifocal hepatic lesions,  favoring metastases, including a dominant 3.0 x 2.3 cm lesion inferiorly in segment 6 (series 2/image 71), previously 3.7 x 2.3 cm when measured in a similar fashion. Adjacent 11 mm lesion anteriorly/inferiorly (series 2/image 34), previously 14 mm. Gallbladder is unremarkable. No intrahepatic or extrahepatic ductal dilatation. Pancreas: Within normal limits. Spleen: Within normal limits. Adrenals/Urinary Tract: Adrenal glands within normal limits. Right upper pole renal cysts measuring up to 2.1 cm. Left kidney is within normal limits. No hydronephrosis. Bladder is thick-walled although underdistended. Stomach/Bowel: Stomach is within normal limits. No evidence of bowel obstruction. Normal appendix (series 2/image 99). Left colonic diverticulosis, without evidence of diverticulitis. Vascular/Lymphatic: No evidence of abdominal aortic aneurysm. Atherosclerotic calcifications of the abdominal aorta and branch vessels. No suspicious abdominopelvic lymphadenopathy. Reproductive: Prostate is unremarkable. Other: No abdominopelvic ascites. Postsurgical changes related to prior ventral/bilateral inguinal hernia repair. Tiny fat containing bilateral inguinal hernias. Musculoskeletal: Multifocal sclerotic osseous metastases, including at L2 and L3, grossly unchanged. Right hip arthroplasty, without evidence of complication. IMPRESSION: Soft tissue thickening along the right upper lobe bronchus, compatible with the patient's known primary bronchogenic neoplasm. Additional peribronchovascular and patchy opacities in the lungs bilaterally, likely reflecting multifocal tumor in this patient, although technically infection is also possible. Improving thoracic nodal metastases.  Improving hepatic metastases. Multifocal sclerotic osseous metastases, grossly unchanged. Electronically Signed   By: Julian Hy M.D.   On: 02/04/2018 11:52    Assessment and plan- Patient is a 62 y.o. male with stage IV lung cancer status post  4 cycles of chemotherapy last cycle on 03/01/2018 admitted for healthcare associated pneumonia  1.  Patient is currently on IV cefepime as well as vancomycin for healthcare associated pneumonia.  He is also on IV Flagyl which I am not sure why.  He is currently receiving IV fluids and symptomatically he is feeling better.  2.  Patient has a normal white count.  He has mild thrombocytopenia which can be monitored and likely due to chemotherapy.  He is a little more anemic as compared to his baseline and his hemoglobin is 9.4.  I suspect this is chemo induced anemia but I will add iron studies B12 and folate to his labs tomorrow.  Dr. Rogue Bussing will see him on Monday    Visit Diagnosis 1. Fever, unspecified fever cause   2. Tachycardia   3. Non-intractable vomiting with nausea, unspecified vomiting type     Dr. Randa Evens, MD, MPH [facility] at [facility] [phone] 03/05/2018 3:02 PM

## 2018-03-06 LAB — BASIC METABOLIC PANEL
Anion gap: 7 (ref 5–15)
BUN: 11 mg/dL (ref 8–23)
CO2: 22 mmol/L (ref 22–32)
CREATININE: 0.88 mg/dL (ref 0.61–1.24)
Calcium: 6.8 mg/dL — ABNORMAL LOW (ref 8.9–10.3)
Chloride: 106 mmol/L (ref 98–111)
GFR calc Af Amer: >60 mL/min (ref >60)
Glucose, Bld: 112 mg/dL — ABNORMAL HIGH (ref 70–99)
Potassium: 3.2 mmol/L — ABNORMAL LOW (ref 3.5–5.1)
SODIUM: 135 mmol/L (ref 135–145)

## 2018-03-06 LAB — CBC
HCT: 26 % — ABNORMAL LOW (ref 39.0–52.0)
Hemoglobin: 8.8 g/dL — ABNORMAL LOW (ref 13.0–17.0)
MCH: 33.6 pg (ref 26.0–34.0)
MCHC: 33.8 g/dL (ref 30.0–36.0)
MCV: 99.2 fL (ref 80.0–100.0)
PLATELETS: 109 10*3/uL — AB (ref 150–400)
RBC: 2.62 MIL/uL — ABNORMAL LOW (ref 4.22–5.81)
RDW: 13.3 % (ref 11.5–15.5)
WBC: 3.7 10*3/uL — ABNORMAL LOW (ref 4.0–10.5)
nRBC: 0 % (ref 0.0–0.2)

## 2018-03-06 LAB — VANCOMYCIN, TROUGH: Vancomycin Tr: 12 ug/mL — ABNORMAL LOW (ref 15–20)

## 2018-03-06 MED ORDER — DIPHENHYDRAMINE HCL 25 MG PO CAPS
25.0000 mg | ORAL_CAPSULE | Freq: Every evening | ORAL | Status: DC | PRN
  Administered 2018-03-06: 21:00:00 25 mg via ORAL
  Filled 2018-03-06: qty 1

## 2018-03-06 MED ORDER — ENSURE ENLIVE PO LIQD
237.0000 mL | Freq: Three times a day (TID) | ORAL | Status: DC
  Administered 2018-03-06: 237 mL via ORAL

## 2018-03-06 MED ORDER — VANCOMYCIN HCL 10 G IV SOLR
1250.0000 mg | Freq: Two times a day (BID) | INTRAVENOUS | Status: DC
  Administered 2018-03-06: 1250 mg via INTRAVENOUS
  Filled 2018-03-06 (×3): qty 1250

## 2018-03-06 MED ORDER — POTASSIUM CHLORIDE CRYS ER 20 MEQ PO TBCR
40.0000 meq | EXTENDED_RELEASE_TABLET | Freq: Once | ORAL | Status: AC
  Administered 2018-03-06: 12:00:00 40 meq via ORAL
  Filled 2018-03-06: qty 2

## 2018-03-06 MED ORDER — LORAZEPAM 2 MG/ML IJ SOLN
0.5000 mg | Freq: Once | INTRAMUSCULAR | Status: AC
  Administered 2018-03-06: 0.5 mg via INTRAVENOUS
  Filled 2018-03-06: qty 1

## 2018-03-06 NOTE — Progress Notes (Signed)
Pharmacy Antibiotic Note  James [name] is a 62 y.o. male admitted on 03/04/2018 with unknown source.  Pharmacy has been consulted for vancomycin and cefepime dosing.  10/20 20:00 Vancomycin trough resulted at 12 mcg/ml  Plan: Will increase Vancomycin dose to 1250mg  IV q12h, recheck Vancomycin trough with 5th new dose.  Continue Cefepime 2 grams q 8 hours   Height: 5\' 8"  (172.7 cm) Weight: 146 lb (66.2 kg) IBW/kg (Calculated) : 68.4  Temp (24hrs), Avg:98.4 F (36.9 C), Min:98.1 F (36.7 C), Max:98.7 F (37.1 C)  Recent Labs  Lab 03/02/18 0900 03/04/18 2219 03/05/18 0000 03/05/18 0656 03/06/18 0330 03/06/18 1933  WBC 4.1 5.1  --  4.3 3.7*  --   CREATININE 0.91 1.06  --  0.89 0.88  --   LATICACIDVEN  --   --  1.6 1.3  --   --   VANCOTROUGH  --   --   --   --   --  12*    Estimated Creatinine Clearance: 79.4 mL/min (by C-G formula based on SCr of 0.88 mg/dL).    No Known Allergies  Antimicrobials this admission: vancomycin 10/18 >>  cefepime 10/18  >>   Dose adjustments this admission: 10/20 Vancomycin increased to 1250mg  IV q12h  Microbiology results: 10/18 BCx: NGTD   Thank you for allowing pharmacy to be a part of this patient's care.  Paulina Fusi, PharmD, BCPS 03/06/2018 8:26 PM

## 2018-03-06 NOTE — Progress Notes (Signed)
Pt states he "does not feel good".  States he is in pain and cannot get comfortable.  Vital signs obtained.  Roxicodone given.  Will continue to monitor.

## 2018-03-06 NOTE — Progress Notes (Signed)
Pt agitated, states he cannot get comfortable.  MD notified.

## 2018-03-06 NOTE — Progress Notes (Signed)
[place] at [facility]                                                                                                                                                                                  Patient Demographics   James [name], is a 62 y.o. male, DOB - [birth year], HUT:[id number]  Admit date - 03/04/2018   Admitting Physician Lance Coon, MD  Outpatient Primary MD for the patient is Park Liter P, DO   LOS - 1  Subjective: Patient feeling better diarrhea is improved to very weak  Review of Systems:   CONSTITUTIONAL: Positive fever. No fatigue, weakness. No weight gain, no weight loss.  EYES: No blurry or double vision.  ENT: No tinnitus. No postnasal drip. No redness of the oropharynx.  RESPIRATORY: Positive cough, no wheeze, no hemoptysis. No dyspnea.  CARDIOVASCULAR: No chest pain. No orthopnea. No palpitations. No syncope.  GASTROINTESTINAL: No nausea, positive vomiting positive diarrhea. No abdominal pain. No melena or hematochezia.  GENITOURINARY: No dysuria or hematuria.  ENDOCRINE: No polyuria or nocturia. No heat or cold intolerance.  HEMATOLOGY: No anemia. No bruising. No bleeding.  INTEGUMENTARY: No rashes. No lesions.  MUSCULOSKELETAL: No arthritis. No swelling. No gout.  NEUROLOGIC: No numbness, tingling, or ataxia. No seizure-type activity.  PSYCHIATRIC: No anxiety. No insomnia. No ADD.    Vitals:   Vitals:   03/05/18 2129 03/05/18 2219 03/06/18 0629 03/06/18 1257  BP: 132/89  128/89 (!) 126/95  Pulse: (!) 121 (!) 116 (!) 104 (!) 102  Resp: 18   18  Temp: 98.7 F (37.1 C)  98.2 F (36.8 C) 98.4 F (36.9 C)  TempSrc: Oral  Oral Oral  SpO2: 96%  93% 95%  Weight:      Height:        Wt Readings from Last 3 Encounters:  03/05/18 66.2 kg  03/02/18 63.5 kg  02/08/18 65.3 kg     Intake/Output Summary (Last 24 hours) at 03/06/2018 1356 Last data filed at 03/06/2018 1150 Gross per 24 hour  Intake 440.25 ml  Output  1125 ml  Net -684.75 ml    Physical Exam:   GENERAL: Pleasant-appearing in no apparent distress.  HEAD, EYES, EARS, NOSE AND THROAT: Atraumatic, normocephalic. Extraocular muscles are intact. Pupils equal and reactive to light. Sclerae anicteric. No conjunctival injection. No oro-pharyngeal erythema.  NECK: Supple. There is no jugular venous distention. No bruits, no lymphadenopathy, no thyromegaly.  HEART: Regular rate and rhythm,. No murmurs, no rubs, no clicks.  LUNGS: Clear to auscultation bilaterally. No rales or rhonchi. No wheezes.  ABDOMEN: Soft, flat, nontender, nondistended. Has good bowel sounds. No hepatosplenomegaly appreciated.  EXTREMITIES: No  evidence of any cyanosis, clubbing, or peripheral edema.  +2 pedal and radial pulses bilaterally.  NEUROLOGIC: The patient is alert, awake, and oriented x3 with no focal motor or sensory deficits appreciated bilaterally.  SKIN: Moist and warm with no rashes appreciated.  Psych: Not anxious, depressed LN: No inguinal LN enlargement    Antibiotics   Anti-infectives (From admission, onward)   Start     Dose/Rate Route Frequency Ordered Stop   03/05/18 0800  vancomycin (VANCOCIN) IVPB 1000 mg/200 mL premix     1,000 mg 200 mL/hr over 60 Minutes Intravenous Every 12 hours 03/05/18 0054     03/05/18 0600  ceFEPIme (MAXIPIME) 2 g in sodium chloride 0.9 % 100 mL IVPB     2 g 200 mL/hr over 30 Minutes Intravenous Every 8 hours 03/04/18 2315     03/04/18 2215  ceFEPIme (MAXIPIME) 2 g in sodium chloride 0.9 % 100 mL IVPB     2 g 200 mL/hr over 30 Minutes Intravenous  Once 03/04/18 2209 03/05/18 0028   03/04/18 2215  metroNIDAZOLE (FLAGYL) IVPB 500 mg     500 mg 100 mL/hr over 60 Minutes Intravenous Every 8 hours 03/04/18 2209     03/04/18 2215  vancomycin (VANCOCIN) IVPB 1000 mg/200 mL premix     1,000 mg 200 mL/hr over 60 Minutes Intravenous  Once 03/04/18 2209 03/05/18 0138      Medications   Scheduled Meds: . enoxaparin  (LOVENOX) injection  40 mg Subcutaneous Q24H  . feeding supplement (ENSURE ENLIVE)  237 mL Oral TID BM  . tiotropium  18 mcg Inhalation Daily   Continuous Infusions: . ceFEPime (MAXIPIME) IV Stopped (03/06/18 0700)  . metronidazole Stopped (03/06/18 0715)  . vancomycin Stopped (03/06/18 0915)   PRN Meds:.acetaminophen **OR** acetaminophen, benzonatate, guaiFENesin-dextromethorphan, metoprolol tartrate, ondansetron **OR** ondansetron (ZOFRAN) IV, oxyCODONE, prochlorperazine   Data Review:   Micro Results Recent Results (from the past 240 hour(s))  Blood Culture (routine x 2)     Status: None (Preliminary result)   Collection Time: 03/04/18 10:05 PM  Result Value Ref Range Status   Specimen Description LEFT ANTECUBITAL  Final   Special Requests NONE  Final   Culture   Final    NO GROWTH 2 DAYS Performed at [facility], [address], [place], [place] [address]    Report Status PENDING  Incomplete  Blood Culture (routine x 2)     Status: None (Preliminary result)   Collection Time: 03/04/18 10:19 PM  Result Value Ref Range Status   Specimen Description BLOOD RIGHT ANTECUBITAL  Final   Special Requests   Final    BOTTLES DRAWN AEROBIC AND ANAEROBIC Blood Culture results may not be optimal due to an excessive volume of blood received in culture bottles   Culture   Final    NO GROWTH 2 DAYS Performed at [facility], [address], [place], [place] [address]    Report Status PENDING  Incomplete  C difficile quick scan w PCR reflex     Status: None   Collection Time: 03/05/18 12:17 PM  Result Value Ref Range Status   C Diff antigen NEGATIVE NEGATIVE Final   C Diff toxin NEGATIVE NEGATIVE Final   C Diff interpretation No C. difficile detected.  Final    Comment: Performed at [facility], [address], [place], [place] [address]    Radiology Reports Dg Chest 2 View  Result Date: 03/05/2018 CLINICAL DATA:  Fever EXAM: CHEST - 2 VIEW  COMPARISON:  CT 02/04/2018, radiograph 11/01/2017 FINDINGS: No pleural effusion. Emphysematous disease. Coarse chronic interstitial opacity. Increased left upper and peripheral nodular opacity. Decreased nodularity in the right upper lobe. Normal heart size. No pneumothorax. IMPRESSION: 1. Increased peripheral mid to upper lung opacity, slightly nodular in appearance, possible acute infection or inflammatory process. Lung nodules in this patient with history of lung cancer are also considered. 2. Underlying emphysematous disease and coarse interstitial disease. Slight decrease nodularity in the right upper lobe since prior radiograph. Electronically Signed   By: Donavan Foil M.D.   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 EXAM: MRI HEAD WITHOUT AND WITH CONTRAST TECHNIQUE: Multiplanar, multiecho pulse sequences of the brain and surrounding structures were obtained without and with intravenous contrast. CONTRAST:  67mL MULTIHANCE GADOBENATE DIMEGLUMINE 529 MG/ML IV SOLN COMPARISON:  11/17/2017 FINDINGS: Brain: Treated right occipital lobe metastasis is now mildly enhancing and significantly decreased in size (15 mm in maximum as compared to 26 mm previously). No residual vasogenic edema. No new or untreated metastasis is seen. No infarct, hemorrhage, hydrocephalus, or collection. Vascular: Major flow voids and vascular enhancements are preserved Skull and upper cervical spine: Subcentimeter structure in the anterior dens is stable. Sinuses/Orbits: Negative IMPRESSION: 1. Treated right occipital lobe metastasis with positive treatment response. 2. No new metastasis. Electronically Signed   By: Monte Fantasia M.D.   On: 02/24/2018 14:56   Dg Chest Port 1 View  Result Date: 03/05/2018 CLINICAL DATA:  Cough, metastatic lung cancer EXAM: PORTABLE CHEST 1 VIEW COMPARISON:  Chest radiograph from  one day prior. FINDINGS: Stable cardiomediastinal silhouette with normal heart size. No pneumothorax. No pleural effusion. Extensive patchy reticulonodular opacities throughout both lungs have not appreciably changed. No acute superimposed consolidative airspace disease. IMPRESSION: Stable extensive patchy reticulonodular opacities throughout both lungs, favor lymphangitic tumor. No acute superimposed consolidative airspace disease. Electronically Signed   By: Ilona Sorrel M.D.   On: 03/05/2018 21:59     CBC Recent Labs  Lab 03/02/18 0900 03/04/18 2219 03/05/18 0656 03/06/18 0330  WBC 4.1 5.1 4.3 3.7*  HGB 10.9* 10.9* 9.4* 8.8*  HCT 33.0* 32.0* 27.4* 26.0*  PLT 150 171 122* 109*  MCV 101.5* 99.7 100.0 99.2  MCH 33.5 34.0 34.3* 33.6  MCHC 33.0 34.1 34.3 33.8  RDW 14.3 13.9 14.0 13.3  LYMPHSABS 0.7 0.2*  --   --   MONOABS 0.4 0.2  --   --   EOSABS 0.1 0.1  --   --   BASOSABS 0.0 0.0  --   --     Chemistries  Recent Labs  Lab 03/02/18 0900 03/04/18 2219 03/05/18 0656 03/06/18 0330  NA 137 134* 135 135  K 4.1 4.0 3.6 3.2*  CL 106 101 105 106  CO2 24 24 21* 22  GLUCOSE 167* 116* 106* 112*  BUN 12 13 15 11   CREATININE 0.91 1.06 0.89 0.88  CALCIUM 8.4* 8.5* 7.1* 6.8*  AST 24 28  --   --   ALT 16 15  --   --   ALKPHOS 52 49  --   --   BILITOT 0.5 0.9  --   --    ------------------------------------------------------------------------------------------------------------------ estimated creatinine clearance is 79.4 mL/min (by C-G formula based on SCr of 0.88 mg/dL). ------------------------------------------------------------------------------------------------------------------ No results for input(s): HGBA1C in the last 72 hours. ------------------------------------------------------------------------------------------------------------------ No results for input(s): CHOL, HDL, LDLCALC, TRIG,  CHOLHDL, LDLDIRECT in the last 72  hours. ------------------------------------------------------------------------------------------------------------------ No results for input(s): TSH, T4TOTAL, T3FREE, THYROIDAB in the last 72 hours.  Invalid input(s): FREET3 ------------------------------------------------------------------------------------------------------------------ No results for input(s): VITAMINB12, FOLATE, FERRITIN, TIBC, IRON, RETICCTPCT in the last 72 hours.  Coagulation profile Recent Labs  Lab 03/04/18 2219  INR 1.17    No results for input(s): DDIMER in the last 72 hours.  Cardiac Enzymes Recent Labs  Lab 03/04/18 2219  TROPONINI 0.03*   ------------------------------------------------------------------------------------------------------------------ Invalid input(s): POCBNP    Assessment & Plan   Patient is a 62 year old admitted with sepsis  #1 Sepsis ([place]) -due to pneumonia, possible chemo induced Continue IV antibiotics for 1 more day   #2 HCAP (healthcare-associated pneumonia) -continue IV antibiotics as above,     #3 primary cancer of right upper lobe of lung ([place]) -patient just finished receiving last chemotherapy dose yesterday.    Oncology consult noted  #4 misc: Lovenox for DVT prophylaxis      Code Status Orders  (From admission, onward)         Start     Ordered   03/05/18 0225  Full code  Continuous     03/05/18 0224        Code Status History    Date Active Date Inactive Code Status Order ID Comments User Context   04/09/2017 1103 04/11/2017 1427 Full Code [id number]  Mcarthur Rossetti, MD Inpatient    Advance Directive Documentation     Most Recent Value  Type of Advance Directive  Living will  Pre-existing out of facility DNR order (yellow form or pink MOST form)  -  "MOST" Form in Place?  -           Consults  rao  DVT Prophylaxis  Lovenox   Lab Results  Component Value Date   PLT 109 (L) 03/06/2018     Time Spent in minutes   54min 12 PM 1245pm greater than 50% of time spent in care coordination and counseling patient regarding the condition and plan of care.   Dustin Flock M.D on 03/06/2018 at 1:56 PM  Between 7am to 6pm - Pager - [phone]  After 6pm go to [email or web address] - Proofreader  [employer]   Office  [phone]

## 2018-03-06 NOTE — Progress Notes (Signed)
Initial Nutrition Assessment  DOCUMENTATION CODES:   Non-severe (moderate) malnutrition in context of chronic illness  INTERVENTION:  Recommend liberalizing diet to regular.  Provide Ensure Enlive po TID, each supplement provides 350 kcal and 20 grams of protein. Patient prefers strawberry.  Encouraged ongoing intake of small, frequent meals throughout the day containing calorie- and protein-dense foods/beverages.  NUTRITION DIAGNOSIS:   Moderate Malnutrition related to chronic illness(stage IV lung cancer) as evidenced by mild fat depletion, mild muscle depletion, moderate muscle depletion.  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   Met with patient at bedside. He reports his appetite has been decreased from the chemotherapy and it is difficult for him to eat. He experiences nausea and taste changes typically, though his nausea is better today and he reports his appetite is picking back up. He typically eats 3-4 smaller meals during the day and also snacks between his meals. For breakfast he has cereal with milk. Lunch is usually a sandwich. Dinner is usually a meat with sides. He has had Ensure and other ONS before but does not drink them regularly yet.  Patient reports his UBW was around 162 lbs (73.6 kg). He reports he has lost about 20 lbs since his diagnosis. Per chart he was last around 160 lbs in 2017. He was 146 lbs on 04/09/2017, 149.4 lbs on 07/06/2017  Medications reviewed and include: cefepime, Flagyl, vancomycin.  Labs reviewed: Potassium 3.2.  NUTRITION - FOCUSED PHYSICAL  EXAM:    Most Recent Value  Orbital Region  Mild depletion  Upper Arm Region  Moderate depletion  Thoracic and Lumbar Region  Mild depletion  Buccal Region  Mild depletion  Temple Region  Moderate depletion  Clavicle Bone Region  Mild depletion  Clavicle and Acromion Bone Region  Mild depletion  Scapular Bone Region  Mild depletion  Dorsal Hand  No depletion  Patellar Region  Moderate depletion  Anterior Thigh Region  Moderate depletion  Posterior Calf Region  Moderate depletion  Edema (RD Assessment)  None  Hair  Reviewed  Eyes  Reviewed  Mouth  Reviewed  Skin  Reviewed  Nails  Reviewed     Diet Order:   Diet Order            Diet Heart Room service appropriate? Yes; Fluid consistency: Thin  Diet effective now              EDUCATION NEEDS:   Education needs have been addressed  Skin:  Skin Assessment: Reviewed RN Assessment(ecchymosis)  Last BM:  03/05/2018 - medium type 7  Height:   Ht Readings from Last 1 Encounters:  03/05/18 '5\' 8"'$  (1.727 m)    Weight:   Wt Readings from Last 1 Encounters:  03/05/18 66.2 kg    Ideal Body Weight:  70 kg  BMI:  Body mass index is 22.2 kg/m.  Estimated Nutritional Needs:   Kcal:  1860-2150 (MSJ x 1.3-1.5)  Protein:  80-100 grams (1.2-1.5 grams/kg)  Fluid:  1.8-2.1 L/day (1 mL/kcal)  Willey Blade, MS, RD, LDN Office: [phone] Pager: [phone] After Hours/Weekend  Pager: [phone]

## 2018-03-06 NOTE — Plan of Care (Signed)

## 2018-03-07 ENCOUNTER — Other Ambulatory Visit: Payer: Self-pay | Admitting: *Deleted

## 2018-03-07 ENCOUNTER — Telehealth: Payer: Self-pay | Admitting: *Deleted

## 2018-03-07 DIAGNOSIS — E440 Moderate protein-calorie malnutrition: Secondary | ICD-10-CM

## 2018-03-07 DIAGNOSIS — C3490 Malignant neoplasm of unspecified part of unspecified bronchus or lung: Secondary | ICD-10-CM

## 2018-03-07 LAB — CBC
HCT: 26 % — ABNORMAL LOW (ref 39.0–52.0)
Hemoglobin: 8.9 g/dL — ABNORMAL LOW (ref 13.0–17.0)
MCH: 33.8 pg (ref 26.0–34.0)
MCHC: 34.2 g/dL (ref 30.0–36.0)
MCV: 98.9 fL (ref 80.0–100.0)
NRBC: 0 % (ref 0.0–0.2)
PLATELETS: 113 10*3/uL — AB (ref 150–400)
RBC: 2.63 MIL/uL — AB (ref 4.22–5.81)
RDW: 13.2 % (ref 11.5–15.5)
WBC: 3.4 10*3/uL — ABNORMAL LOW (ref 4.0–10.5)

## 2018-03-07 LAB — BASIC METABOLIC PANEL
ANION GAP: 8 (ref 5–15)
BUN: 9 mg/dL (ref 8–23)
CO2: 23 mmol/L (ref 22–32)
CREATININE: 0.74 mg/dL (ref 0.61–1.24)
Calcium: 6.8 mg/dL — ABNORMAL LOW (ref 8.9–10.3)
Chloride: 100 mmol/L (ref 98–111)
GFR calc Af Amer: >60 mL/min (ref >60)
Glucose, Bld: 99 mg/dL (ref 70–99)
POTASSIUM: 3.1 mmol/L — AB (ref 3.5–5.1)
Sodium: 131 mmol/L — ABNORMAL LOW (ref 135–145)

## 2018-03-07 MED ORDER — POTASSIUM CHLORIDE CRYS ER 20 MEQ PO TBCR
60.0000 meq | EXTENDED_RELEASE_TABLET | Freq: Once | ORAL | Status: AC
  Administered 2018-03-07: 60 meq via ORAL
  Filled 2018-03-07: qty 3

## 2018-03-07 MED ORDER — LORAZEPAM 0.5 MG PO TABS: 0.5000 mg | ORAL_TABLET | Freq: Every evening | ORAL | 0 refills | Status: DC | PRN

## 2018-03-07 MED ORDER — AMOXICILLIN-POT CLAVULANATE 875-125 MG PO TABS: 1.0000 | ORAL_TABLET | Freq: Two times a day (BID) | ORAL | 0 refills | Status: AC

## 2018-03-07 MED ORDER — BENZONATATE 200 MG PO CAPS: 200.0000 mg | ORAL_CAPSULE | Freq: Three times a day (TID) | ORAL | 0 refills | Status: DC | PRN

## 2018-03-07 MED ORDER — METOPROLOL TARTRATE 25 MG PO TABS: 12.5000 mg | ORAL_TABLET | Freq: Two times a day (BID) | ORAL | 0 refills | Status: AC

## 2018-03-07 MED ORDER — POTASSIUM CHLORIDE CRYS ER 20 MEQ PO TBCR: 20.0000 meq | EXTENDED_RELEASE_TABLET | Freq: Two times a day (BID) | ORAL | 0 refills | Status: DC

## 2018-03-07 MED ORDER — IPRATROPIUM-ALBUTEROL 0.5-2.5 (3) MG/3ML IN SOLN: 3.0000 mL | Freq: Four times a day (QID) | RESPIRATORY_TRACT | 1 refills | Status: DC | PRN

## 2018-03-07 MED ORDER — GUAIFENESIN-DM 100-10 MG/5ML PO SYRP: 5.0000 mL | ORAL_SOLUTION | ORAL | 0 refills | Status: DC | PRN

## 2018-03-07 MED ORDER — METOPROLOL TARTRATE 25 MG PO TABS
12.5000 mg | ORAL_TABLET | Freq: Two times a day (BID) | ORAL | Status: DC
  Administered 2018-03-07: 10:00:00 12.5 mg via ORAL
  Filled 2018-03-07: qty 1

## 2018-03-07 NOTE — Progress Notes (Signed)
Received MD order  To discharge patient to home, reviewed home meds, prescriptions, follow up appointments and discharge instructions with patient and patient verbalized understanding

## 2018-03-07 NOTE — Telephone Encounter (Signed)
Pt's wife called to ask if prescription for breathing treatments be sent to [facility] since pharmacy did not receive yet. Also, was asking if pt could get prescription for ativan to help him relax. Per Dr. Jacinto Reap, may send in lorazepam 0.5mg  qhs PRN #30. Rx sent to Dr. Jacinto Reap to e-sign. Rx for duoneb escribed to pharmacy. Nothing further needed at this time.

## 2018-03-07 NOTE — Care Management (Signed)
No discharge needs identified by members of the care team 

## 2018-03-07 NOTE — Progress Notes (Signed)
SATURATION QUALIFICATIONS: (This note is used to comply with regulatory documentation for home oxygen)  Patient Saturations on Room Air at Rest = 91%  Patient Saturations on Room Air while Ambulating = 89%  Patient Saturations on  Liters of oxygen while Ambulating = %  Please briefly explain why patient needs home oxygen:

## 2018-03-07 NOTE — Discharge Summary (Signed)
[employer] [facility] [place] [facility], 62 y.o., DOB [birth year], MRN [id number]. Admission date: 03/04/2018 Discharge Date 03/07/2018 Primary MD Valerie Roys, DO Admitting Physician Lance Coon, MD  Admission Diagnosis  Tachycardia [R00.0] Fever, unspecified fever cause [R50.9] Non-intractable vomiting with nausea, unspecified vomiting type [R11.2]  Discharge Diagnosis   Principal Problem: Sepsis ruled out H CAP pneumonia Primary cancer of right upper lobe of lung ([place]) Malnutrition of moderate degree Hypokalemia       Hospital Course  patient is a 62 year old male with stage IV lung cancer status post 4 cycles of carbotaxol Tecentriq and  Avastin.  Last treatment was on 03/02/2018.  Patient started to feel bad after his chemo he started having shortness of breath cough nausea vomiting and diarrhea.  He was evaluated in the ED and thought to have possible pneumonia.  Patient initially presented with sepsis-like syndrome.  And work-up including blood cultures have been negative.  He was treated with antibiotics is doing much better.  He was seen by his oncologist who recommended outpatient follow-up.            Consults  oncology  Significant Tests:  See full reports for all details     Dg Chest 2 View  Result Date: 03/05/2018 CLINICAL DATA:  Fever EXAM: CHEST - 2 VIEW COMPARISON:  CT 02/04/2018, radiograph 11/01/2017 FINDINGS: No pleural effusion. Emphysematous disease. Coarse chronic interstitial opacity. Increased left upper and peripheral nodular opacity. Decreased nodularity in the right upper lobe. Normal heart size. No pneumothorax. IMPRESSION: 1. Increased peripheral mid to upper lung opacity, slightly nodular in appearance, possible acute infection or inflammatory process. Lung nodules in this patient with history of lung cancer are also considered. 2. Underlying emphysematous disease and coarse interstitial disease. Slight decrease  nodularity in the right upper lobe since prior radiograph. Electronically Signed   By: Donavan Foil M.D.   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 EXAM: MRI HEAD WITHOUT AND WITH CONTRAST TECHNIQUE: Multiplanar, multiecho pulse sequences of the brain and surrounding structures were obtained without and with intravenous contrast. CONTRAST:  75mL MULTIHANCE GADOBENATE DIMEGLUMINE 529 MG/ML IV SOLN COMPARISON:  11/17/2017 FINDINGS: Brain: Treated right occipital lobe metastasis is now mildly enhancing and significantly decreased in size (15 mm in maximum as compared to 26 mm previously). No residual vasogenic edema. No new or untreated metastasis is seen. No infarct, hemorrhage, hydrocephalus, or collection. Vascular: Major flow voids and vascular enhancements are preserved Skull and upper cervical spine: Subcentimeter structure in the anterior dens is stable. Sinuses/Orbits: Negative IMPRESSION: 1. Treated right occipital lobe metastasis with positive treatment response. 2. No new metastasis. Electronically Signed   By: Monte Fantasia M.D.   On: 02/24/2018 14:56   Dg Chest Port 1 View  Result Date: 03/05/2018 CLINICAL DATA:  Cough, metastatic lung cancer EXAM: PORTABLE CHEST 1 VIEW COMPARISON:  Chest radiograph from one day prior. FINDINGS: Stable cardiomediastinal silhouette with normal heart size. No pneumothorax. No pleural effusion. Extensive patchy reticulonodular opacities throughout both lungs have not appreciably changed. No acute superimposed consolidative airspace disease. IMPRESSION: Stable extensive patchy reticulonodular opacities throughout both lungs, favor lymphangitic tumor. No acute superimposed consolidative airspace disease. Electronically Signed   By: Ilona Sorrel M.D.   On: 03/05/2018 21:59       Today   Subjective:   James [name] patient doing much better  o Objective:   Blood pressure (!) 141/95, pulse 97, temperature 98.6 F (37 C), temperature source Oral, resp. rate 18, height 5\' 8"  (1.727 m), weight 66.2 kg, SpO2 95 %.  .  Intake/Output Summary (Last 24 hours) at 03/07/2018 1607 Last data filed at 03/07/2018 0959 Gross per 24 hour  Intake 1337.46 ml  Output -  Net 1337.46 ml    Exam VITAL SIGNS: Blood pressure (!) 141/95, pulse 97, temperature 98.6 F (37 C), temperature source Oral, resp. rate 18, height 5\' 8"  (1.727 m), weight 66.2 kg, SpO2 95 %.  GENERAL:  62 y.o.-year-old patient lying in the bed with no acute distress.  EYES: Pupils equal, round, reactive to light and accommodation. No scleral icterus. Extraocular muscles intact.  HEENT: Head atraumatic, normocephalic. Oropharynx and nasopharynx clear.  NECK:  Supple, no jugular venous distention. No thyroid enlargement, no tenderness.  LUNGS: Normal breath sounds bilaterally, no wheezing, rales,rhonchi or crepitation. No use of accessory muscles of respiration.  CARDIOVASCULAR: S1, S2 normal. No murmurs, rubs, or gallops.  ABDOMEN: Soft, nontender, nondistended. Bowel sounds present. No organomegaly or mass.  EXTREMITIES: No pedal edema, cyanosis, or clubbing.  NEUROLOGIC: Cranial nerves II through XII are intact. Muscle strength 5/5 in all extremities. Sensation intact. Gait not checked.  PSYCHIATRIC: The patient is alert and oriented x 3.  SKIN: No obvious rash, lesion, or ulcer.   Data Review     CBC w Diff:  Lab Results  Component Value Date   WBC 3.4 (L) 03/07/2018   HGB 8.9 (L) 03/07/2018   HGB 13.3 08/24/2017   HCT 26.0 (L) 03/07/2018   HCT 40.5 08/24/2017   PLT 113 (L) 03/07/2018   PLT 268 08/24/2017   LYMPHOPCT 4 03/04/2018   MONOPCT 4 03/04/2018   EOSPCT 2 03/04/2018   BASOPCT 0 03/04/2018   CMP:  Lab Results  Component Value Date   NA 131 (L) 03/07/2018   NA 138 08/24/2017   NA 137 04/23/2013   K 3.1 (L) 03/07/2018   K 3.8 04/23/2013   CL  100 03/07/2018   CL 105 04/23/2013   CO2 23 03/07/2018   CO2 22 04/23/2013   BUN 9 03/07/2018   BUN 18 08/24/2017   BUN 18 04/23/2013   CREATININE 0.74 03/07/2018   CREATININE 0.90 04/23/2013   PROT 6.6 03/04/2018   PROT 6.8 08/24/2017   PROT 7.5 04/23/2013   ALBUMIN 3.5 03/04/2018   ALBUMIN 4.2 08/24/2017   ALBUMIN 3.8 04/23/2013   BILITOT 0.9 03/04/2018   BILITOT 0.2 08/24/2017   BILITOT 0.6 04/23/2013   ALKPHOS 49 03/04/2018   ALKPHOS 64 04/23/2013   AST 28 03/04/2018   AST 11 (L) 04/23/2013   ALT 15 03/04/2018   ALT 18 04/23/2013  .  Micro Results Recent Results (from the past 240 hour(s))  Blood Culture (routine x 2)     Status: None (Preliminary result)   Collection Time: 03/04/18 10:05 PM  Result Value Ref Range Status   Specimen Description LEFT ANTECUBITAL  Final   Special Requests NONE  Final   Culture   Final    NO GROWTH 3 DAYS Performed at [facility], [address] [place], [place] [address]    Report Status PENDING  Incomplete  Blood Culture (routine x 2)     Status: None (Preliminary result)   Collection Time: 03/04/18 10:19 PM  Result Value Ref Range Status   Specimen Description BLOOD RIGHT ANTECUBITAL  Final   Special Requests  Final    BOTTLES DRAWN AEROBIC AND ANAEROBIC Blood Culture results may not be optimal due to an excessive volume of blood received in culture bottles   Culture   Final    NO GROWTH 3 DAYS Performed at [facility], [address] [place], [place] [address]    Report Status PENDING  Incomplete  C difficile quick scan w PCR reflex     Status: None   Collection Time: 03/05/18 12:17 PM  Result Value Ref Range Status   C Diff antigen NEGATIVE NEGATIVE Final   C Diff toxin NEGATIVE NEGATIVE Final   C Diff interpretation No C. difficile detected.  Final    Comment: Performed at [facility], [address] [place], [place] [address]        Code Status Orders  (From admission, onward)          Start     Ordered   03/05/18 0225  Full code  Continuous     03/05/18 0224        Code Status History    Date Active Date Inactive Code Status Order ID Comments User Context   04/09/2017 1103 04/11/2017 1427 Full Code [id number]  Mcarthur Rossetti, MD Inpatient    Advance Directive Documentation     Most Recent Value  Type of Advance Directive  Living will  Pre-existing out of facility DNR order (yellow form or pink MOST form)  -  "MOST" Form in Place?  -          Follow-up Information    Cammie Sickle, MD Follow up.   Specialties:  Internal Medicine, Oncology Why:  as scheduled wlll need bmp at time of follow up Contact information: [place]  [id number] [phone]           Discharge Medications   Allergies as of 03/07/2018   No Known Allergies     Medication List    STOP taking these medications   lisinopril-hydrochlorothiazide 20-12.5 MG tablet Commonly known as:  PRINZIDE,ZESTORETIC   megestrol 625 MG/5ML suspension Commonly known as:  MEGACE ES     TAKE these medications   albuterol 108 (90 Base) MCG/ACT inhaler Commonly known as:  PROVENTIL HFA;VENTOLIN HFA Inhale 1-2 puffs into the lungs every 6 (six) hours as needed for wheezing or shortness of breath.   amoxicillin-clavulanate 875-125 MG tablet Commonly known as:  AUGMENTIN Take 1 tablet by mouth 2 (two) times daily for 5 days.   benzonatate 200 MG capsule Commonly known as:  TESSALON Take 1 capsule (200 mg total) by mouth 3 (three) times daily as needed for cough.   Glycopyrrolate-Formoterol 9-4.8 MCG/ACT Aero Inhale 2 puffs into the lungs daily.   guaiFENesin-dextromethorphan 100-10 MG/5ML syrup Commonly known as:  ROBITUSSIN DM Take 5 mLs by mouth every 4 (four) hours as needed for cough.   ipratropium-albuterol 0.5-2.5 (3) MG/3ML Soln Commonly known as:  DUONEB Take 3 mLs by nebulization every 6 (six) hours as needed.   LORazepam  0.5 MG tablet Commonly known as:  ATIVAN Take 1 tablet (0.5 mg total) by mouth at bedtime as needed for anxiety.   metoprolol tartrate 25 MG tablet Commonly known as:  LOPRESSOR Take 0.5 tablets (12.5 mg total) by mouth 2 (two) times daily.   multivitamin with minerals Tabs tablet Take 1 tablet by mouth daily.   ondansetron 8 MG tablet Commonly known as:  ZOFRAN One pill every 8 hours as needed for nausea/vomitting. What changed:  how much to take  how to take this  when to take this  reasons to take this   potassium chloride SA 20 MEQ tablet Commonly known as:  K-DUR,KLOR-CON Take 1 tablet (20 mEq total) by mouth 2 (two) times daily for 4 days.   prochlorperazine 10 MG tablet Commonly known as:  COMPAZINE Take 1 tablet (10 mg total) by mouth every 6 (six) hours as needed for nausea or vomiting.   SPIRIVA HANDIHALER 18 MCG inhalation capsule Generic drug:  tiotropium INHALE 1 CAPSULE VIA HANDIHALER ONCE DAILY AT THE SAME TIME EVERY DAY What changed:  See the new instructions.          Total Time in preparing paper work, data evaluation and todays exam - 37 minutes  Dustin Flock M.D on 03/07/2018 at 4:07 PM [place]  [phone]

## 2018-03-07 NOTE — Progress Notes (Signed)
James [name]   DOB:August 27, 1955   HQ#:[id number]    Subjective: Patient's breathing is improved.  Appetite improving.  No nausea no vomiting.  No diarrhea.  Feels stronger-eager to go home.  Has had some difficulty sleeping at night because of anxiety.  Objective:  Vitals:   03/07/18 0937 03/07/18 1251  BP:  (!) 141/95  Pulse: (!) 114 97  Resp:  18  Temp:  98.6 F (37 C)  SpO2:  95%     Intake/Output Summary (Last 24 hours) at 03/07/2018 1756 Last data filed at 03/07/2018 0959 Gross per 24 hour  Intake 1337.46 ml  Output -  Net 1337.46 ml    GENERAL Alert, no distress and comfortable.  Alone. EYES: no pallor or icterus OROPHARYNX: no thrush or ulceration. NECK: supple, no masses felt LYMPH:  no palpable lymphadenopathy in the cervical, axillary or inguinal regions LUNGS: decreased breath sounds to auscultation at bases and  No wheeze or crackles HEART/CVS: regular rate & rhythm and no murmurs; No lower extremity edema ABDOMEN: abdomen soft, tender  on deep palpation. and normal bowel sounds Musculoskeletal:no cyanosis of digits and no clubbing  PSYCH: alert & oriented x 3 with fluent speech NEURO: no focal motor/sensory deficits SKIN:  no rashes or significant lesions   Labs:  Lab Results  Component Value Date   WBC 3.4 (L) 03/07/2018   HGB 8.9 (L) 03/07/2018   HCT 26.0 (L) 03/07/2018   MCV 98.9 03/07/2018   PLT 113 (L) 03/07/2018   NEUTROABS 4.6 03/04/2018    Lab Results  Component Value Date   NA 131 (L) 03/07/2018   K 3.1 (L) 03/07/2018   CL 100 03/07/2018   CO2 23 03/07/2018    Studies:  Dg Chest Port 1 View  Result Date: 03/05/2018 CLINICAL DATA:  Cough, metastatic lung cancer EXAM: PORTABLE CHEST 1 VIEW COMPARISON:  Chest radiograph from one day prior. FINDINGS: Stable cardiomediastinal silhouette with normal heart size. No pneumothorax. No pleural effusion. Extensive patchy reticulonodular opacities throughout both lungs have not appreciably changed. No  acute superimposed consolidative airspace disease. IMPRESSION: Stable extensive patchy reticulonodular opacities throughout both lungs, favor lymphangitic tumor. No acute superimposed consolidative airspace disease. Electronically Signed   By: Ilona Sorrel M.D.   On: 03/05/2018 21:59    Assessment & Plan:   #62 year old male patient with a history of metastatic adenocarcinoma the lung currently on immune a chemotherapy is currently admitted to hospital for pneumonia.  #Metastatic adenocarcinoma the lung-currently status post cycle #4 of carboplatin Taxol-Avastin Tecentriq approximately 4 days ago.  Plan is to repeat imaging in approximately month-to reevaluate the status of disease.  Patient will be offered Avastin Tecentriq maintenance.  #Pneumonia-status post antibiotics.  Patient also has significant improvement clinically.  #Hyponatremia/hypokalemia-mild.  Recommend nutritional supplementation.   #Anxiety/difficulty sleeping at night recommend Ativan 0.5 mg nightly as needed.  Scription given.  # Patient can be discharged home as clinically stable from oncology standpoint.  Discussed with Dr. Posey Pronto.  Cammie Sickle, MD 03/07/2018  5:56 PM

## 2018-03-08 ENCOUNTER — Other Ambulatory Visit: Payer: Self-pay | Admitting: *Deleted

## 2018-03-08 DIAGNOSIS — C3490 Malignant neoplasm of unspecified part of unspecified bronchus or lung: Secondary | ICD-10-CM

## 2018-03-08 LAB — HIV ANTIBODY (ROUTINE TESTING W REFLEX): HIV Screen 4th Generation wRfx: NONREACTIVE

## 2018-03-09 LAB — CULTURE, BLOOD (ROUTINE X 2)
Culture: NO GROWTH
Culture: NO GROWTH

## 2018-03-10 ENCOUNTER — Inpatient Hospital Stay: Payer: BC Managed Care – PPO

## 2018-03-14 ENCOUNTER — Inpatient Hospital Stay: Payer: BC Managed Care – PPO

## 2018-03-14 ENCOUNTER — Other Ambulatory Visit: Payer: Self-pay

## 2018-03-14 ENCOUNTER — Inpatient Hospital Stay (HOSPITAL_BASED_OUTPATIENT_CLINIC_OR_DEPARTMENT_OTHER): Payer: BC Managed Care – PPO | Admitting: Internal Medicine

## 2018-03-14 VITALS — BP 114/80 | HR 105 | Temp 97.5°F | Resp 16 | Wt 136.0 lb

## 2018-03-14 DIAGNOSIS — Z87891 Personal history of nicotine dependence: Secondary | ICD-10-CM

## 2018-03-14 DIAGNOSIS — D709 Neutropenia, unspecified: Secondary | ICD-10-CM | POA: Diagnosis not present

## 2018-03-14 DIAGNOSIS — T451X5A Adverse effect of antineoplastic and immunosuppressive drugs, initial encounter: Secondary | ICD-10-CM | POA: Diagnosis not present

## 2018-03-14 DIAGNOSIS — Z79899 Other long term (current) drug therapy: Secondary | ICD-10-CM

## 2018-03-14 DIAGNOSIS — C787 Secondary malignant neoplasm of liver and intrahepatic bile duct: Secondary | ICD-10-CM

## 2018-03-14 DIAGNOSIS — C7931 Secondary malignant neoplasm of brain: Secondary | ICD-10-CM

## 2018-03-14 DIAGNOSIS — R634 Abnormal weight loss: Secondary | ICD-10-CM | POA: Diagnosis not present

## 2018-03-14 DIAGNOSIS — C7951 Secondary malignant neoplasm of bone: Secondary | ICD-10-CM

## 2018-03-14 DIAGNOSIS — C3411 Malignant neoplasm of upper lobe, right bronchus or lung: Secondary | ICD-10-CM | POA: Diagnosis present

## 2018-03-14 DIAGNOSIS — C3490 Malignant neoplasm of unspecified part of unspecified bronchus or lung: Secondary | ICD-10-CM

## 2018-03-14 DIAGNOSIS — Z5111 Encounter for antineoplastic chemotherapy: Secondary | ICD-10-CM | POA: Diagnosis not present

## 2018-03-14 DIAGNOSIS — J449 Chronic obstructive pulmonary disease, unspecified: Secondary | ICD-10-CM

## 2018-03-14 LAB — BASIC METABOLIC PANEL
Anion gap: 8 (ref 5–15)
BUN: 14 mg/dL (ref 8–23)
CHLORIDE: 103 mmol/L (ref 98–111)
CO2: 23 mmol/L (ref 22–32)
CREATININE: 1 mg/dL (ref 0.61–1.24)
Calcium: 8.9 mg/dL (ref 8.9–10.3)
GFR calc Af Amer: >60 mL/min (ref >60)
GFR calc non Af Amer: >60 mL/min (ref >60)
Glucose, Bld: 116 mg/dL — ABNORMAL HIGH (ref 70–99)
Potassium: 4.5 mmol/L (ref 3.5–5.1)
SODIUM: 134 mmol/L — AB (ref 135–145)

## 2018-03-14 LAB — CBC WITH DIFFERENTIAL/PLATELET
ABS IMMATURE GRANULOCYTES: 0 10*3/uL (ref 0.00–0.07)
Basophils Absolute: 0 10*3/uL (ref 0.0–0.1)
Basophils Relative: 1 %
EOS PCT: 4 %
Eosinophils Absolute: 0.1 10*3/uL (ref 0.0–0.5)
HCT: 30.4 % — ABNORMAL LOW (ref 39.0–52.0)
HEMOGLOBIN: 10.1 g/dL — AB (ref 13.0–17.0)
IMMATURE GRANULOCYTES: 0 %
LYMPHS ABS: 0.5 10*3/uL — AB (ref 0.7–4.0)
LYMPHS PCT: 37 %
MCH: 33 pg (ref 26.0–34.0)
MCHC: 33.2 g/dL (ref 30.0–36.0)
MCV: 99.3 fL (ref 80.0–100.0)
MONO ABS: 0.5 10*3/uL (ref 0.1–1.0)
Monocytes Relative: 38 %
Neutro Abs: 0.3 10*3/uL — ABNORMAL LOW (ref 1.7–7.7)
Neutrophils Relative %: 20 %
Platelets: 122 10*3/uL — ABNORMAL LOW (ref 150–400)
RBC: 3.06 MIL/uL — ABNORMAL LOW (ref 4.22–5.81)
RDW: 13.4 % (ref 11.5–15.5)
SMEAR REVIEW: DECREASED
WBC: 1.3 10*3/uL — AB (ref 4.0–10.5)
nRBC: 0 % (ref 0.0–0.2)

## 2018-03-14 MED ORDER — LORAZEPAM 1 MG PO TABS: 1.0000 mg | ORAL_TABLET | Freq: Every evening | ORAL | 0 refills | Status: DC | PRN

## 2018-03-14 NOTE — Progress Notes (Signed)
[place] OFFICE PROGRESS NOTE  Patient Care Team: Valerie Roys, DO as PCP - General (Family Medicine) Telford Nab, RN as Registered Nurse Rogue Bussing, Elisha Headland, MD as Medical Oncologist (Medical Oncology)  Cancer Staging No matching staging information was found for the patient.   Oncology History   # July 2019- [facility]- RUL [s/p Liver Bx]-CT chest- RUL/ liver/ brain solitary lesion.  to contralateral lung; pleura; liver and brain.July 2019- A/P- liver metastases at least 2; bone scan shows right humeral; left fifth rib; L2 vertebral body metastases.  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  --------------------------------------------------------------------   DIAGNOSIS: Adenocarcinoma lung  STAGE: 4       ;GOALS: Palliative  CURRENT/MOST RECENT THERAPY -CARBO+TAXOL+ATEZO+AVASTIN      Primary cancer of right upper lobe of lung ([place])   11/24/2017 -  Chemotherapy    The patient had bevacizumab (AVASTIN) 1,000 mg in sodium chloride 0.9 % 100 mL chemo infusion, 15 mg/kg = 1,000 mg, Intravenous,  Once, 4 of 4 cycles Administration: 1,000 mg (12/16/2017), 1,000 mg (01/19/2018), 1,000 mg (02/09/2018), 1,000 mg (03/03/2018)  for chemotherapy treatment.     12/09/2017 -  Chemotherapy    The patient had palonosetron (ALOXI) injection 0.25 mg, 0.25 mg, Intravenous,  Once, 4 of 4 cycles Administration: 0.25 mg (12/16/2017), 0.25 mg (01/19/2018), 0.25 mg (02/09/2018), 0.25 mg (03/03/2018) CARBOplatin (PARAPLATIN) 670 mg in sodium chloride 0.9 % 250 mL chemo infusion, 670 mg (100 % of original dose 667.8 mg), Intravenous,  Once, 4 of 4 cycles Dose modification:   (original dose 667.8 mg, Cycle 1, Reason: Provider Judgment) Administration: 670 mg (12/16/2017), 610 mg (01/19/2018), 610 mg (02/09/2018) PACLitaxel (TAXOL) 360 mg in sodium chloride 0.9 % 500 mL chemo infusion (>  48m/m2), 200 mg/m2 = 360 mg, Intravenous,  Once, 4 of 4 cycles Administration: 360 mg (12/16/2017), 360 mg (01/19/2018), 360 mg (02/09/2018), 360 mg (03/03/2018) atezolizumab (TECENTRIQ) 1,200 mg in sodium chloride 0.9 % 250 mL chemo infusion, 1,200 mg, Intravenous, Once, 4 of 4 cycles Administration: 1,200 mg (12/16/2017), 1,200 mg (01/19/2018), 1,200 mg (02/09/2018), 1,200 mg (03/03/2018)  for chemotherapy treatment.        INTERVAL HISTORY:  James [id number].o.  male of a history of metastatic adenocarcinoma the lung currently status post cycle #4 carbotaxol Avastin plus Tecentriq is here for follow-up.  In interim patient was admitted to the hospital for pneumonia; treated with antibiotics.  Discharged home.  Patient is currently finished antibiotics.  Continues to have intermittent nausea needing to take 1 Zofran a day at least.  Denies any headaches.  Denies any chest pain.  Continues to have cough.  Continues to have mild shortness of with exertion.  Complains of fatigue.  No fevers.  No chills.  Ativan has not been helping him sleep better.  Currently taking 0.5 mg of Ativan at night  Review of Systems  Constitutional: Positive for malaise/fatigue and weight loss. Negative for chills, diaphoresis and fever.  HENT: Negative for nosebleeds and sore throat.   Eyes: Negative for double vision.  Respiratory: Positive for cough and shortness of breath. Negative for hemoptysis, sputum production and wheezing.   Cardiovascular: Negative for chest pain, palpitations, orthopnea and leg swelling.  Gastrointestinal: Negative for abdominal pain, blood in stool, constipation, diarrhea, heartburn, melena and vomiting.  Genitourinary: Negative for dysuria, frequency and urgency.  Musculoskeletal: Negative for back pain and joint pain.  Skin: Negative.  Negative for itching and rash.  Neurological: Negative for dizziness, tingling, focal weakness, weakness and headaches.  Endo/Heme/Allergies: Does not  bruise/bleed easily.  Psychiatric/Behavioral: Negative for depression. The patient is nervous/anxious. The patient does not have insomnia.       PAST MEDICAL HISTORY :  Past Medical History:  Diagnosis Date  . Cancer ([place]) 12/01/2017   Primary cancer of right upper lobe of lung. Mets to liver, brain.  [facility] Kitchen Prepatellar bursitis of left knee    patient denies    PAST SURGICAL HISTORY :   Past Surgical History:  Procedure Laterality Date  . collapsed lung  2012   "had to reinflate" " i carried large piece of sheet rock up stairs by myself "   . [place]   x2 , second was with mesh   . TOTAL HIP ARTHROPLASTY Right 04/09/2017   Procedure: RIGHT TOTAL HIP ARTHROPLASTY ANTERIOR APPROACH;  Surgeon: Mcarthur Rossetti, MD;  Location: [facility];  Service: Orthopedics;  Laterality: Right;  . URETHRAL STRICTURE DILATATION      FAMILY HISTORY :  No family history on file.  SOCIAL HISTORY:   Social History   Tobacco Use  . Smoking status: Former Smoker    Types: Cigarettes    Last attempt to quit: 03/18/2012    Years since quitting: 5.9  . Smokeless tobacco: Never Used  . Tobacco comment: 5 years   Substance Use Topics  . Alcohol use: Yes    Comment: Socially  . Drug use: Yes    Types: Marijuana    Comment: 1 month ago     ALLERGIES:  has No Known Allergies.  MEDICATIONS:  Current Outpatient Medications  Medication Sig Dispense Refill  . albuterol (PROAIR HFA) 108 (90 Base) MCG/ACT inhaler Inhale 1-2 puffs into the lungs every 6 (six) hours as needed for wheezing or shortness of breath. 1 Inhaler 5  . benzonatate (TESSALON) 200 MG capsule Take 1 capsule (200 mg total) by mouth 3 (three) times daily as needed for cough. 20 capsule 0  . Glycopyrrolate-Formoterol (BEVESPI AEROSPHERE) 9-4.8 MCG/ACT AERO Inhale 2 puffs into the lungs daily. 1 Inhaler 5  . guaiFENesin-dextromethorphan (ROBITUSSIN DM) 100-10 MG/5ML syrup Take 5 mLs by mouth every 4 (four) hours as  needed for cough. 118 mL 0  . ipratropium-albuterol (DUONEB) 0.5-2.5 (3) MG/3ML SOLN Take 3 mLs by nebulization every 6 (six) hours as needed. 360 mL 1  . LORazepam (ATIVAN) 1 MG tablet Take 1 tablet (1 mg total) by mouth at bedtime as needed for anxiety. 30 tablet 0  . metoprolol tartrate (LOPRESSOR) 25 MG tablet Take 0.5 tablets (12.5 mg total) by mouth 2 (two) times daily. 60 tablet 0  . Multiple Vitamin (MULTIVITAMIN WITH MINERALS) TABS tablet Take 1 tablet by mouth daily.    . ondansetron (ZOFRAN) 8 MG tablet One pill every 8 hours as needed for nausea/vomitting. (Patient taking differently: Take 8 mg by mouth every 8 (eight) hours as needed for vomiting. One pill every 8 hours as needed for nausea/vomitting.) 40 tablet 1  . prochlorperazine (COMPAZINE) 10 MG tablet Take 1 tablet (10 mg total) by mouth every 6 (six) hours as needed for nausea or vomiting. 40 tablet 1  . SPIRIVA HANDIHALER 18 MCG inhalation capsule INHALE 1 CAPSULE VIA HANDIHALER ONCE DAILY AT THE SAME TIME EVERY DAY (Patient taking differently: Place 18 mcg into inhaler and inhale daily. ) 30 capsule 0  . potassium chloride SA (K-DUR,KLOR-CON) 20  MEQ tablet Take 1 tablet (20 mEq total) by mouth 2 (two) times daily for 4 days. 8 tablet 0   No current facility-administered medications for this visit.     PHYSICAL EXAMINATION: ECOG PERFORMANCE STATUS: 1 - Symptomatic but completely ambulatory  BP 114/80 (BP Location: Left Arm, Patient Position: Sitting)   Pulse (!) 105   Temp (!) 97.5 F (36.4 C) (Tympanic)   Resp 16   Wt 136 lb (61.7 kg)   BMI 20.68 kg/m   Filed Weights   03/14/18 1320  Weight: 136 lb (61.7 kg)    Physical Exam  Constitutional: He is oriented to person, place, and time and well-developed, well-nourished, and in no distress.  Accompanied by his wife.  HENT:  Head: Normocephalic and atraumatic.  Mouth/Throat: Oropharynx is clear and moist. No oropharyngeal exudate.  Eyes: Pupils are equal, round,  and reactive to light.  Neck: Normal range of motion. Neck supple.  Cardiovascular: Normal rate and regular rhythm.  Pulmonary/Chest: No respiratory distress. He has no wheezes.  Decreased air entry bilaterally.  Abdominal: Soft. Bowel sounds are normal. He exhibits no distension and no mass. There is no tenderness. There is no rebound and no guarding.  Musculoskeletal: Normal range of motion. He exhibits no edema or tenderness.  Neurological: He is alert and oriented to person, place, and time.  Skin: Skin is warm.  Psychiatric: Affect normal.    LABORATORY DATA:  I have reviewed the data as listed    Component Value Date/Time   NA 134 (L) 03/14/2018 1253   NA 138 08/24/2017 1651   NA 137 04/23/2013 1330   K 4.5 03/14/2018 1253   K 3.8 04/23/2013 1330   CL 103 03/14/2018 1253   CL 105 04/23/2013 1330   CO2 23 03/14/2018 1253   CO2 22 04/23/2013 1330   GLUCOSE 116 (H) 03/14/2018 1253   GLUCOSE 120 (H) 04/23/2013 1330   BUN 14 03/14/2018 1253   BUN 18 08/24/2017 1651   BUN 18 04/23/2013 1330   CREATININE 1.00 03/14/2018 1253   CREATININE 0.90 04/23/2013 1330   CALCIUM 8.9 03/14/2018 1253   CALCIUM 9.3 04/23/2013 1330   PROT 6.6 03/04/2018 2219   PROT 6.8 08/24/2017 1651   PROT 7.5 04/23/2013 1330   ALBUMIN 3.5 03/04/2018 2219   ALBUMIN 4.2 08/24/2017 1651   ALBUMIN 3.8 04/23/2013 1330   AST 28 03/04/2018 2219   AST 11 (L) 04/23/2013 1330   ALT 15 03/04/2018 2219   ALT 18 04/23/2013 1330   ALKPHOS 49 03/04/2018 2219   ALKPHOS 64 04/23/2013 1330   BILITOT 0.9 03/04/2018 2219   BILITOT 0.2 08/24/2017 1651   BILITOT 0.6 04/23/2013 1330   GFRNONAA >60 03/14/2018 1253   GFRNONAA >60 04/23/2013 1330   GFRAA >60 03/14/2018 1253   GFRAA >60 04/23/2013 1330    No results found for: SPEP, UPEP  Lab Results  Component Value Date   WBC 1.3 (LL) 03/14/2018   NEUTROABS 0.3 (L) 03/14/2018   HGB 10.1 (L) 03/14/2018   HCT 30.4 (L) 03/14/2018   MCV 99.3 03/14/2018   PLT  122 (L) 03/14/2018      Chemistry      Component Value Date/Time   NA 134 (L) 03/14/2018 1253   NA 138 08/24/2017 1651   NA 137 04/23/2013 1330   K 4.5 03/14/2018 1253   K 3.8 04/23/2013 1330   CL 103 03/14/2018 1253   CL 105 04/23/2013 1330   CO2 23 03/14/2018 1253  CO2 22 04/23/2013 1330   BUN 14 03/14/2018 1253   BUN 18 08/24/2017 1651   BUN 18 04/23/2013 1330   CREATININE 1.00 03/14/2018 1253   CREATININE 0.90 04/23/2013 1330      Component Value Date/Time   CALCIUM 8.9 03/14/2018 1253   CALCIUM 9.3 04/23/2013 1330   ALKPHOS 49 03/04/2018 2219   ALKPHOS 64 04/23/2013 1330   AST 28 03/04/2018 2219   AST 11 (L) 04/23/2013 1330   ALT 15 03/04/2018 2219   ALT 18 04/23/2013 1330   BILITOT 0.9 03/04/2018 2219   BILITOT 0.2 08/24/2017 1651   BILITOT 0.6 04/23/2013 1330       RADIOGRAPHIC STUDIES: I have personally reviewed the radiological images as listed and agreed with the findings in the report. No results found.   ASSESSMENT & PLAN:  Primary cancer of right upper lobe of lung ([place]) #Lung adenocarcinoma-stage IV-metastases- STABLE. S/p carbotaxol plus Avastin plus Tecentriq cycle # 2;  SEP 2019- Partial response/stable disease.   # s/p Carbo-Taxol-avastin-Tecentriq # 4 on 10/17.  Awaiting follow-up CT scans prior to next visit.  #Neutropenia-ANC 300; second to chemotherapy.  Patient is symptomatic.  Hold off Granix.  # Bone mets- X-geva continue-  calcium vitamin D.  Stable  # COPD on streoids inhlaer [Dr. Ram]; Spiriva/albuterol.  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  Stable  # Insominia- increase ativan to 19m.  New prescription given.  # DISPOSITION: follow up as planned on 11/13; CT scan chest prior- Dr.B   No orders of the defined types were placed in this encounter.  All questions were answered. The patient knows to call the clinic with any problems,  questions or concerns.      GCammie Sickle MD 03/14/2018 5:17 PM

## 2018-03-14 NOTE — Progress Notes (Signed)
Nutrition  Patient not seen for nutrition assessment today as patient wants to reschedule due to other activities this afternoon per nursing.  Jalah Warmuth B. Zenia Resides, [place], [place] Registered Dietitian [phone] (pager)

## 2018-03-14 NOTE — Assessment & Plan Note (Addendum)
#  Lung adenocarcinoma-stage IV-metastases- STABLE. S/p carbotaxol plus Avastin plus Tecentriq cycle # 2;  SEP 2019- Partial response/stable disease.   # s/p Carbo-Taxol-avastin-Tecentriq # 4 on 10/17.  Awaiting follow-up CT scans prior to next visit.  #Neutropenia-ANC 300; second to chemotherapy.  Patient is symptomatic.  Hold off Granix.  # Bone mets- X-geva continue-  calcium vitamin D.  Stable  # COPD on streoids inhlaer [Dr. Ram]; Spiriva/albuterol.  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  Stable  # Insominia- increase ativan to '1mg'$ .  New prescription given.  # DISPOSITION: follow up as planned on 11/13; CT scan chest prior- Dr.B

## 2018-03-24 ENCOUNTER — Inpatient Hospital Stay: Payer: BC Managed Care – PPO

## 2018-03-24 ENCOUNTER — Other Ambulatory Visit: Payer: Self-pay | Admitting: *Deleted

## 2018-03-24 ENCOUNTER — Telehealth: Payer: Self-pay | Admitting: Internal Medicine

## 2018-03-24 ENCOUNTER — Emergency Department: Payer: BC Managed Care – PPO

## 2018-03-24 ENCOUNTER — Other Ambulatory Visit: Payer: Self-pay

## 2018-03-24 ENCOUNTER — Inpatient Hospital Stay
Admission: EM | Admit: 2018-03-24 | Discharge: 2018-04-04 | DRG: 193 | Disposition: A | Discharge status: Skilled Nursing Facility | Payer: BC Managed Care – PPO | Attending: Internal Medicine | Admitting: Internal Medicine

## 2018-03-24 ENCOUNTER — Inpatient Hospital Stay: Payer: BC Managed Care – PPO | Attending: Oncology | Admitting: Oncology

## 2018-03-24 VITALS — BP 105/75 | HR 134 | Temp 100.4°F | Resp 28

## 2018-03-24 DIAGNOSIS — R509 Fever, unspecified: Secondary | ICD-10-CM

## 2018-03-24 DIAGNOSIS — R0689 Other abnormalities of breathing: Secondary | ICD-10-CM

## 2018-03-24 DIAGNOSIS — Z96641 Presence of right artificial hip joint: Secondary | ICD-10-CM | POA: Diagnosis present

## 2018-03-24 DIAGNOSIS — Y95 Nosocomial condition: Secondary | ICD-10-CM | POA: Diagnosis present

## 2018-03-24 DIAGNOSIS — C782 Secondary malignant neoplasm of pleura: Secondary | ICD-10-CM | POA: Diagnosis not present

## 2018-03-24 DIAGNOSIS — Z923 Personal history of irradiation: Secondary | ICD-10-CM | POA: Diagnosis not present

## 2018-03-24 DIAGNOSIS — Z66 Do not resuscitate: Secondary | ICD-10-CM | POA: Diagnosis present

## 2018-03-24 DIAGNOSIS — R918 Other nonspecific abnormal finding of lung field: Secondary | ICD-10-CM

## 2018-03-24 DIAGNOSIS — J189 Pneumonia, unspecified organism: Secondary | ICD-10-CM | POA: Diagnosis present

## 2018-03-24 DIAGNOSIS — C3411 Malignant neoplasm of upper lobe, right bronchus or lung: Secondary | ICD-10-CM | POA: Diagnosis present

## 2018-03-24 DIAGNOSIS — F05 Delirium due to known physiological condition: Secondary | ICD-10-CM | POA: Diagnosis not present

## 2018-03-24 DIAGNOSIS — R0600 Dyspnea, unspecified: Secondary | ICD-10-CM

## 2018-03-24 DIAGNOSIS — J9601 Acute respiratory failure with hypoxia: Secondary | ICD-10-CM | POA: Diagnosis not present

## 2018-03-24 DIAGNOSIS — E440 Moderate protein-calorie malnutrition: Secondary | ICD-10-CM | POA: Diagnosis present

## 2018-03-24 DIAGNOSIS — Z6820 Body mass index (BMI) 20.0-20.9, adult: Secondary | ICD-10-CM

## 2018-03-24 DIAGNOSIS — J441 Chronic obstructive pulmonary disease with (acute) exacerbation: Secondary | ICD-10-CM | POA: Diagnosis present

## 2018-03-24 DIAGNOSIS — R0902 Hypoxemia: Secondary | ICD-10-CM | POA: Diagnosis not present

## 2018-03-24 DIAGNOSIS — R011 Cardiac murmur, unspecified: Secondary | ICD-10-CM | POA: Diagnosis present

## 2018-03-24 DIAGNOSIS — X58XXXA Exposure to other specified factors, initial encounter: Secondary | ICD-10-CM | POA: Diagnosis present

## 2018-03-24 DIAGNOSIS — D630 Anemia in neoplastic disease: Secondary | ICD-10-CM | POA: Diagnosis present

## 2018-03-24 DIAGNOSIS — Z87891 Personal history of nicotine dependence: Secondary | ICD-10-CM

## 2018-03-24 DIAGNOSIS — R451 Restlessness and agitation: Secondary | ICD-10-CM | POA: Diagnosis not present

## 2018-03-24 DIAGNOSIS — Z8701 Personal history of pneumonia (recurrent): Secondary | ICD-10-CM | POA: Diagnosis not present

## 2018-03-24 DIAGNOSIS — F23 Brief psychotic disorder: Secondary | ICD-10-CM | POA: Diagnosis present

## 2018-03-24 DIAGNOSIS — I10 Essential (primary) hypertension: Secondary | ICD-10-CM | POA: Diagnosis present

## 2018-03-24 DIAGNOSIS — R5081 Fever presenting with conditions classified elsewhere: Secondary | ICD-10-CM | POA: Diagnosis not present

## 2018-03-24 DIAGNOSIS — C799 Secondary malignant neoplasm of unspecified site: Secondary | ICD-10-CM | POA: Diagnosis not present

## 2018-03-24 DIAGNOSIS — T50905A Adverse effect of unspecified drugs, medicaments and biological substances, initial encounter: Secondary | ICD-10-CM | POA: Diagnosis not present

## 2018-03-24 DIAGNOSIS — J440 Chronic obstructive pulmonary disease with acute lower respiratory infection: Secondary | ICD-10-CM | POA: Diagnosis present

## 2018-03-24 DIAGNOSIS — C3490 Malignant neoplasm of unspecified part of unspecified bronchus or lung: Secondary | ICD-10-CM | POA: Diagnosis not present

## 2018-03-24 DIAGNOSIS — Z79899 Other long term (current) drug therapy: Secondary | ICD-10-CM | POA: Diagnosis not present

## 2018-03-24 DIAGNOSIS — J702 Acute drug-induced interstitial lung disorders: Secondary | ICD-10-CM | POA: Diagnosis present

## 2018-03-24 DIAGNOSIS — T380X5A Adverse effect of glucocorticoids and synthetic analogues, initial encounter: Secondary | ICD-10-CM | POA: Diagnosis present

## 2018-03-24 DIAGNOSIS — D899 Disorder involving the immune mechanism, unspecified: Secondary | ICD-10-CM | POA: Diagnosis not present

## 2018-03-24 DIAGNOSIS — E860 Dehydration: Secondary | ICD-10-CM | POA: Diagnosis present

## 2018-03-24 DIAGNOSIS — Z515 Encounter for palliative care: Secondary | ICD-10-CM | POA: Diagnosis not present

## 2018-03-24 DIAGNOSIS — Z9981 Dependence on supplemental oxygen: Secondary | ICD-10-CM | POA: Diagnosis not present

## 2018-03-24 DIAGNOSIS — R64 Cachexia: Secondary | ICD-10-CM | POA: Diagnosis present

## 2018-03-24 DIAGNOSIS — Z9221 Personal history of antineoplastic chemotherapy: Secondary | ICD-10-CM

## 2018-03-24 DIAGNOSIS — J9621 Acute and chronic respiratory failure with hypoxia: Secondary | ICD-10-CM | POA: Diagnosis present

## 2018-03-24 DIAGNOSIS — C7931 Secondary malignant neoplasm of brain: Secondary | ICD-10-CM | POA: Diagnosis present

## 2018-03-24 DIAGNOSIS — F064 Anxiety disorder due to known physiological condition: Secondary | ICD-10-CM | POA: Diagnosis not present

## 2018-03-24 DIAGNOSIS — C7951 Secondary malignant neoplasm of bone: Secondary | ICD-10-CM | POA: Diagnosis present

## 2018-03-24 DIAGNOSIS — R0603 Acute respiratory distress: Secondary | ICD-10-CM | POA: Diagnosis not present

## 2018-03-24 DIAGNOSIS — J449 Chronic obstructive pulmonary disease, unspecified: Secondary | ICD-10-CM

## 2018-03-24 DIAGNOSIS — R000 Tachycardia, unspecified: Secondary | ICD-10-CM | POA: Diagnosis present

## 2018-03-24 DIAGNOSIS — C787 Secondary malignant neoplasm of liver and intrahepatic bile duct: Secondary | ICD-10-CM | POA: Diagnosis present

## 2018-03-24 DIAGNOSIS — J9600 Acute respiratory failure, unspecified whether with hypoxia or hypercapnia: Secondary | ICD-10-CM | POA: Diagnosis not present

## 2018-03-24 DIAGNOSIS — F410 Panic disorder [episodic paroxysmal anxiety] without agoraphobia: Secondary | ICD-10-CM | POA: Diagnosis present

## 2018-03-24 DIAGNOSIS — R410 Disorientation, unspecified: Secondary | ICD-10-CM | POA: Diagnosis not present

## 2018-03-24 DIAGNOSIS — R0602 Shortness of breath: Secondary | ICD-10-CM

## 2018-03-24 DIAGNOSIS — T451X5A Adverse effect of antineoplastic and immunosuppressive drugs, initial encounter: Secondary | ICD-10-CM | POA: Diagnosis present

## 2018-03-24 LAB — COMPREHENSIVE METABOLIC PANEL
ALBUMIN: 3 g/dL — AB (ref 3.5–5.0)
ALK PHOS: 63 U/L (ref 38–126)
ALT: 43 U/L (ref 0–44)
AST: 42 U/L — ABNORMAL HIGH (ref 15–41)
Anion gap: 12 (ref 5–15)
BUN: 14 mg/dL (ref 8–23)
CO2: 20 mmol/L — AB (ref 22–32)
Calcium: 8.4 mg/dL — ABNORMAL LOW (ref 8.9–10.3)
Chloride: 99 mmol/L (ref 98–111)
Creatinine, Ser: 0.82 mg/dL (ref 0.61–1.24)
GFR calc Af Amer: >60 mL/min (ref >60)
GFR calc non Af Amer: >60 mL/min (ref >60)
GLUCOSE: 109 mg/dL — AB (ref 70–99)
Potassium: 4.3 mmol/L (ref 3.5–5.1)
SODIUM: 131 mmol/L — AB (ref 135–145)
Total Bilirubin: 0.8 mg/dL (ref 0.3–1.2)
Total Protein: 7.2 g/dL (ref 6.5–8.1)

## 2018-03-24 LAB — URINALYSIS, ROUTINE W REFLEX MICROSCOPIC
Bilirubin Urine: NEGATIVE
GLUCOSE, UA: NEGATIVE mg/dL
Hgb urine dipstick: NEGATIVE
Ketones, ur: NEGATIVE mg/dL
Leukocytes, UA: NEGATIVE
NITRITE: NEGATIVE
Protein, ur: NEGATIVE mg/dL
Specific Gravity, Urine: 1.024 (ref 1.005–1.030)
pH: 6 (ref 5.0–8.0)

## 2018-03-24 LAB — MRSA PCR SCREENING: MRSA by PCR: NEGATIVE

## 2018-03-24 LAB — CBC WITH DIFFERENTIAL/PLATELET
ABS IMMATURE GRANULOCYTES: 0.05 10*3/uL (ref 0.00–0.07)
BASOS ABS: 0 10*3/uL (ref 0.0–0.1)
BASOS PCT: 1 %
Eosinophils Absolute: 0.1 10*3/uL (ref 0.0–0.5)
Eosinophils Relative: 1 %
HEMATOCRIT: 31.8 % — AB (ref 39.0–52.0)
Hemoglobin: 10.9 g/dL — ABNORMAL LOW (ref 13.0–17.0)
Immature Granulocytes: 1 %
LYMPHS ABS: 0.6 10*3/uL — AB (ref 0.7–4.0)
Lymphocytes Relative: 7 %
MCH: 33.4 pg (ref 26.0–34.0)
MCHC: 34.3 g/dL (ref 30.0–36.0)
MCV: 97.5 fL (ref 80.0–100.0)
MONOS PCT: 13 %
Monocytes Absolute: 1 10*3/uL (ref 0.1–1.0)
NEUTROS PCT: 77 %
NRBC: 0 % (ref 0.0–0.2)
Neutro Abs: 6.3 10*3/uL (ref 1.7–7.7)
PLATELETS: 220 10*3/uL (ref 150–400)
RBC: 3.26 MIL/uL — ABNORMAL LOW (ref 4.22–5.81)
RDW: 13.9 % (ref 11.5–15.5)
WBC: 8.1 10*3/uL (ref 4.0–10.5)

## 2018-03-24 LAB — LACTIC ACID, PLASMA
Lactic Acid, Venous: 1.5 mmol/L (ref 0.5–1.9)
Lactic Acid, Venous: 1.1 mmol/L (ref 0.5–1.9)

## 2018-03-24 LAB — STREP PNEUMONIAE URINARY ANTIGEN: STREP PNEUMO URINARY ANTIGEN: NEGATIVE

## 2018-03-24 LAB — CK: CK TOTAL: 43 U/L — AB (ref 49–397)

## 2018-03-24 LAB — TROPONIN I
TROPONIN I: 0.13 ng/mL — AB (ref <0.03)
Troponin I: 0.12 ng/mL (ref <0.03)
Troponin I: 0.13 ng/mL (ref <0.03)

## 2018-03-24 LAB — INFLUENZA PANEL BY PCR (TYPE A & B)
Influenza A By PCR: NEGATIVE
Influenza B By PCR: NEGATIVE

## 2018-03-24 MED ORDER — ACETAMINOPHEN 325 MG PO TABS
650.0000 mg | ORAL_TABLET | Freq: Once | ORAL | Status: AC
  Administered 2018-03-24: 650 mg via ORAL
  Filled 2018-03-24: qty 2

## 2018-03-24 MED ORDER — ALBUTEROL SULFATE (2.5 MG/3ML) 0.083% IN NEBU
5.0000 mg | INHALATION_SOLUTION | Freq: Once | RESPIRATORY_TRACT | Status: AC
  Administered 2018-03-24: 5 mg via RESPIRATORY_TRACT
  Filled 2018-03-24: qty 6

## 2018-03-24 MED ORDER — ENOXAPARIN SODIUM 40 MG/0.4ML ~~LOC~~ SOLN
40.0000 mg | Freq: Every day | SUBCUTANEOUS | Status: DC
  Administered 2018-03-24 – 2018-04-03 (×11): 40 mg via SUBCUTANEOUS
  Filled 2018-03-24 (×11): qty 0.4

## 2018-03-24 MED ORDER — ALPRAZOLAM 1 MG PO TABS
1.0000 mg | ORAL_TABLET | Freq: Three times a day (TID) | ORAL | Status: DC | PRN
  Administered 2018-03-24 – 2018-03-27 (×7): 1 mg via ORAL
  Filled 2018-03-24 (×6): qty 1

## 2018-03-24 MED ORDER — METOPROLOL TARTRATE 25 MG PO TABS
12.5000 mg | ORAL_TABLET | Freq: Two times a day (BID) | ORAL | Status: DC
  Administered 2018-03-24: 12.5 mg via ORAL
  Filled 2018-03-24: qty 1

## 2018-03-24 MED ORDER — METOPROLOL TARTRATE 25 MG PO TABS
12.5000 mg | ORAL_TABLET | Freq: Two times a day (BID) | ORAL | Status: DC
  Administered 2018-03-25 – 2018-03-26 (×3): 12.5 mg via ORAL
  Filled 2018-03-24 (×4): qty 1

## 2018-03-24 MED ORDER — BENZONATATE 100 MG PO CAPS
200.0000 mg | ORAL_CAPSULE | Freq: Three times a day (TID) | ORAL | Status: DC | PRN
  Administered 2018-03-28: 200 mg via ORAL
  Filled 2018-03-24: qty 2

## 2018-03-24 MED ORDER — IOPAMIDOL (ISOVUE-370) INJECTION 76%
75.0000 mL | Freq: Once | INTRAVENOUS | Status: AC | PRN
  Administered 2018-03-24: 75 mL via INTRAVENOUS

## 2018-03-24 MED ORDER — SODIUM CHLORIDE 0.9 % IV BOLUS (SEPSIS)
1000.0000 mL | Freq: Once | INTRAVENOUS | Status: AC
  Administered 2018-03-24: 1000 mL via INTRAVENOUS

## 2018-03-24 MED ORDER — ALPRAZOLAM 1 MG PO TABS
ORAL_TABLET | ORAL | Status: AC
  Administered 2018-03-24: 21:00:00 1 mg via ORAL
  Filled 2018-03-24: qty 1

## 2018-03-24 MED ORDER — SODIUM CHLORIDE 0.9% FLUSH
3.0000 mL | Freq: Two times a day (BID) | INTRAVENOUS | Status: DC
  Administered 2018-03-24 – 2018-04-04 (×22): 3 mL via INTRAVENOUS

## 2018-03-24 MED ORDER — METRONIDAZOLE 500 MG PO TABS
500.0000 mg | ORAL_TABLET | Freq: Three times a day (TID) | ORAL | Status: DC
  Administered 2018-03-24 – 2018-03-25 (×3): 500 mg via ORAL
  Filled 2018-03-24 (×3): qty 1

## 2018-03-24 MED ORDER — SODIUM CHLORIDE 0.9 % IV SOLN
Freq: Once | INTRAVENOUS | Status: DC
  Filled 2018-03-24: qty 250

## 2018-03-24 MED ORDER — VANCOMYCIN HCL IN DEXTROSE 1-5 GM/200ML-% IV SOLN
1000.0000 mg | Freq: Two times a day (BID) | INTRAVENOUS | Status: DC
  Administered 2018-03-24 – 2018-03-25 (×2): 1000 mg via INTRAVENOUS
  Filled 2018-03-24 (×3): qty 200

## 2018-03-24 MED ORDER — ALBUTEROL SULFATE (2.5 MG/3ML) 0.083% IN NEBU
2.5000 mg | INHALATION_SOLUTION | RESPIRATORY_TRACT | Status: DC | PRN
  Administered 2018-03-24 (×2): 2.5 mg via RESPIRATORY_TRACT
  Filled 2018-03-24 (×3): qty 3

## 2018-03-24 MED ORDER — MOMETASONE FURO-FORMOTEROL FUM 100-5 MCG/ACT IN AERO
2.0000 | INHALATION_SPRAY | Freq: Two times a day (BID) | RESPIRATORY_TRACT | Status: DC
  Administered 2018-03-25 – 2018-04-04 (×19): 2 via RESPIRATORY_TRACT
  Filled 2018-03-24 (×2): qty 8.8

## 2018-03-24 MED ORDER — ONDANSETRON HCL 4 MG/2ML IJ SOLN
4.0000 mg | Freq: Four times a day (QID) | INTRAMUSCULAR | Status: DC | PRN
  Administered 2018-03-24: 18:00:00 4 mg via INTRAVENOUS
  Filled 2018-03-24: qty 2

## 2018-03-24 MED ORDER — ONDANSETRON HCL 4 MG PO TABS: 4.0000 mg | ORAL_TABLET | Freq: Four times a day (QID) | ORAL | Status: DC | PRN

## 2018-03-24 MED ORDER — ACETAMINOPHEN 325 MG PO TABS
650.0000 mg | ORAL_TABLET | Freq: Four times a day (QID) | ORAL | Status: DC | PRN
  Administered 2018-03-25 – 2018-04-02 (×4): 650 mg via ORAL
  Filled 2018-03-24 (×5): qty 2

## 2018-03-24 MED ORDER — VANCOMYCIN HCL IN DEXTROSE 1-5 GM/200ML-% IV SOLN
1000.0000 mg | INTRAVENOUS | Status: AC
  Administered 2018-03-24: 1000 mg via INTRAVENOUS
  Filled 2018-03-24: qty 200

## 2018-03-24 MED ORDER — POLYETHYLENE GLYCOL 3350 17 G PO PACK: 17.0000 g | PACK | Freq: Every day | ORAL | Status: DC | PRN

## 2018-03-24 MED ORDER — SODIUM CHLORIDE 0.9 % IV SOLN
2.0000 g | Freq: Three times a day (TID) | INTRAVENOUS | Status: DC
  Administered 2018-03-24 – 2018-03-30 (×17): 2 g via INTRAVENOUS
  Filled 2018-03-24 (×22): qty 2

## 2018-03-24 MED ORDER — GUAIFENESIN-DM 100-10 MG/5ML PO SYRP
5.0000 mL | ORAL_SOLUTION | ORAL | Status: DC | PRN
  Filled 2018-03-24: qty 5

## 2018-03-24 MED ORDER — ACETAMINOPHEN 650 MG RE SUPP: 650.0000 mg | Freq: Four times a day (QID) | RECTAL | Status: DC | PRN

## 2018-03-24 MED ORDER — METOPROLOL TARTRATE 5 MG/5ML IV SOLN
5.0000 mg | INTRAVENOUS | Status: DC | PRN
  Administered 2018-03-24 – 2018-03-29 (×2): 5 mg via INTRAVENOUS
  Filled 2018-03-24 (×2): qty 5

## 2018-03-24 MED ORDER — ZOLPIDEM TARTRATE 5 MG PO TABS
10.0000 mg | ORAL_TABLET | Freq: Every evening | ORAL | Status: DC | PRN
  Administered 2018-03-24 – 2018-03-26 (×3): 10 mg via ORAL
  Filled 2018-03-24 (×3): qty 2

## 2018-03-24 MED ORDER — SODIUM CHLORIDE 0.9 % IV SOLN
1.0000 g | Freq: Once | INTRAVENOUS | Status: AC
  Administered 2018-03-24: 1 g via INTRAVENOUS
  Filled 2018-03-24: qty 1

## 2018-03-24 MED ORDER — LORAZEPAM 1 MG PO TABS
1.0000 mg | ORAL_TABLET | Freq: Every evening | ORAL | Status: DC | PRN
  Administered 2018-03-26 – 2018-03-28 (×3): 1 mg via ORAL
  Filled 2018-03-24 (×4): qty 1

## 2018-03-24 MED ORDER — METHYLPREDNISOLONE SODIUM SUCC 125 MG IJ SOLR
60.0000 mg | Freq: Four times a day (QID) | INTRAMUSCULAR | Status: DC
  Administered 2018-03-24 – 2018-03-27 (×11): 60 mg via INTRAVENOUS
  Filled 2018-03-24 (×11): qty 2

## 2018-03-24 MED ORDER — SODIUM CHLORIDE 0.9 % IV SOLN
INTRAVENOUS | Status: DC
  Administered 2018-03-24 – 2018-03-25 (×2): via INTRAVENOUS

## 2018-03-24 NOTE — Progress Notes (Addendum)
Symptom Management Consult note [facility]  [phone] [phone]  Patient Care Team: Valerie Roys, DO as PCP - General (Family Medicine) Telford Nab, RN as Registered Nurse Cammie Sickle, MD as Medical Oncologist (Medical Oncology)   Name of the patient: James [name]  [id number]  [birth year]   Date of visit: 03/24/2018  Diagnosis: Lung cancer   Chief Complaint: Fever, tacycardia, sob, tachypnea  Current Treatment: s/p 4 cycles carbo/taxol/avastin + Tecentriq  Oncology History: Patient seen and evaluated by Dr. Rogue Bussing primary medical oncologist on 03/14/2018 for follow-up.  He had recently been discharged from the hospital for pneumonia where he was treated with IV antibiotics and sent home with oral Augmentin for an additional 5 days.  He had recently completed his course of antibiotics.  Complained of intermittent nausea resolved with Zofran.  Continued to complain of cough with mild shortness of breath with exertion. Patient neutropenic with a white count of 1.3 and ANC of 300.  Plan was for repeat imaging with CT abdomen/pelvis/chest on 03/28/2018 to assess treatment response.    Oncology History   # July 2019- [facility]- RUL [s/p Liver Bx]-CT chest- RUL/ liver/ brain solitary lesion.  to contralateral lung; pleura; liver and brain.July 2019- A/P- liver metastases at least 2; bone scan shows right humeral; left fifth rib; L2 vertebral body metastases.  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  --------------------------------------------------------------------   DIAGNOSIS: Adenocarcinoma lung  STAGE: 4       ;GOALS: Palliative  CURRENT/MOST RECENT THERAPY -CARBO+TAXOL+ATEZO+AVASTIN      Primary cancer of right upper lobe of lung ([place])   11/24/2017 -  Chemotherapy    The patient had bevacizumab (AVASTIN) 1,000 mg  in sodium chloride 0.9 % 100 mL chemo infusion, 15 mg/kg = 1,000 mg, Intravenous,  Once, 4 of 4 cycles Administration: 1,000 mg (12/16/2017), 1,000 mg (01/19/2018), 1,000 mg (02/09/2018), 1,000 mg (03/03/2018)  for chemotherapy treatment.     12/09/2017 -  Chemotherapy    The patient had palonosetron (ALOXI) injection 0.25 mg, 0.25 mg, Intravenous,  Once, 4 of 4 cycles Administration: 0.25 mg (12/16/2017), 0.25 mg (01/19/2018), 0.25 mg (02/09/2018), 0.25 mg (03/03/2018) CARBOplatin (PARAPLATIN) 670 mg in sodium chloride 0.9 % 250 mL chemo infusion, 670 mg (100 % of original dose 667.8 mg), Intravenous,  Once, 4 of 4 cycles Dose modification:   (original dose 667.8 mg, Cycle 1, Reason: Provider Judgment) Administration: 670 mg (12/16/2017), 610 mg (01/19/2018), 610 mg (02/09/2018) PACLitaxel (TAXOL) 360 mg in sodium chloride 0.9 % 500 mL chemo infusion (> 75m/m2), 200 mg/m2 = 360 mg, Intravenous,  Once, 4 of 4 cycles Administration: 360 mg (12/16/2017), 360 mg (01/19/2018), 360 mg (02/09/2018), 360 mg (03/03/2018) atezolizumab (TECENTRIQ) 1,200 mg in sodium chloride 0.9 % 250 mL chemo infusion, 1,200 mg, Intravenous, Once, 4 of 4 cycles Administration: 1,200 mg (12/16/2017), 1,200 mg (01/19/2018), 1,200 mg (02/09/2018), 1,200 mg (03/03/2018)  for chemotherapy treatment.      Subjective Data:  Subjective:     James [name] a 62y.o. male who presents for evaluation of fever. He has had the fever for 1 day. Symptoms have been gradually worsening. Symptoms are described as fevers up to 100.4 degrees, and are worse in the morning. Associated symptoms are chills, fatigue, nausea, poor appetite, URI symptoms and shortness of breath with tachycardia. Admits to right-sided chest pain with inspiration.  Denies calf pain.  Patient denies abdominal pain, headache, otitis symptoms, urinary tract symptoms and vomiting.  He has tried to alleviate the symptoms with acetaminophen with minimal relief. The patient has cancer.  The  following portions of the patient's history were reviewed and updated as appropriate: allergies, current medications, past family history, past medical history, past social history, past surgical history and problem list.  Review of Systems A comprehensive review of systems was negative except for: Constitutional: positive for anorexia, chills, fatigue, fevers, malaise and sweats Respiratory: positive for cough, dyspnea on exertion, pleurisy/chest pain and pneumonia Cardiovascular: positive for chest pressure/discomfort, dyspnea, exertional chest pressure/discomfort, fatigue, palpitations and tachypnea Musculoskeletal: positive for muscle weakness Neurological: positive for weakness   Objective:    BP 105/75 (BP Location: Right Arm, Patient Position: Sitting)   Pulse (!) 134   Temp (!) 100.4 F (38 C) (Tympanic)   Resp (!) 28   SpO2 92%  General appearance: alert, fatigued, mild distress and pale Throat: normal findings: oropharynx pink & moist without lesions or evidence of thrush Lungs: diminished breath sounds bilaterally Chest wall: right sided chest wall tenderness Heart: tachycardia- HR 130's Skin: Skin color, texture, turgor normal. No rashes or lesions or pallor Neurologic: Gait: Normal   Assessment:    Fever is likely secondary to recurrent pneumonia.   Plan:    Supportive care with appropriate antipyretics and fluids. Chest x-ray. Referral to Emergency Room.    1.  Stage IV lung cancer with solitary brain met: S/p 4 cycles carbo/Taxol/Avastin + Tecentriq.  Last on 03/03/2018. S/p RT completing on 12/01/2017.  MRI of brain on 02/24/2018 improvement of solitary brain metastasis with reduction from 26 mm to 15 mm.  No evidence of seizure like activity.  No new metastasis.  Scheduled for repeat CT chest/abdomen/pelvis on 03/28/2018 to assess response.  2.  Healthcare associated pneumonia: Admitted to hospital from 03/04/18-10/21/1 for pneumonia.  Sepsis work-up negative.   Initial chest x-ray revealed increased peripheral mid to upper lung opacity with a nodular appearance concerning for infection.  Treated with IV cefepime and vancomycin.  Discharged on oral Augmentin.  Labs improved before discharge.  3.  Tachycardia/hypoxia/dyspnea: On initial evaluation, heart rate in upper 130s, oxygen saturations 88% with respirations of 28. Possible recurrent pneumonia or acute pulmonary embolism given pain with inspiration.   Plan: Place on 2L oxygen. Improvement of saturation to 96%.  Stat Labs. Start IV with IV fluids (Nacl). Stat EKG. Blood cultures. UA/Urine Cultures.  Heart rate improved with IV fluids.  On reassessment, heart rate in the 120s (135).  Blood pressure stable.  EKG revealed sinus tachycardia with possible left atrial enlargement.  Given recent treatment for pneumonia, tachycardia, hypoxia and chest pain with inspiration will escort patient to emergency room.  Patient and wife okay with plan.   He will need stat CTA and chest x-ray for evaluation. Escorted directly to the emergency room.  Dr. Rogue Bussing updated.  Greater than 50% was spent in counseling and coordination of care with this patient including but not limited to discussion of the relevant topics above (See A&P) including, but not limited to diagnosis and management of acute and chronic medical conditions.   Faythe Casa, NP 03/25/2018 10:10 AM

## 2018-03-24 NOTE — ED Triage Notes (Signed)
Pt sent from [facility] with c/o chest pain for the past couple of days with fever. States O2 sats were low and placed pt on 2L  PTA. Pt is a/ox4 on arrival.

## 2018-03-24 NOTE — ED Notes (Signed)
Patient transported to CT 

## 2018-03-24 NOTE — Progress Notes (Signed)
Pt reports difficulty breathing.  Respiration at 29-31, HR 125.  Discussed with nursing supervisor.  She spoke with pt and is on the phone with hospitalist for medications. Orders to follow. Dorna Bloom RN

## 2018-03-24 NOTE — ED Notes (Signed)
Patient transported to X-ray 

## 2018-03-24 NOTE — H&P (Signed)
[facility] [place] NAME: James [name]    MR#:  [id number]  DATE OF BIRTH:  1956/02/01  DATE OF ADMISSION:  03/24/2018  PRIMARY CARE PHYSICIAN: Valerie Roys, DO   REQUESTING/REFERRING PHYSICIAN: Dr. Burlene Arnt  CHIEF COMPLAINT:   Chief Complaint  Patient presents with  . Chest Pain    HISTORY OF PRESENT ILLNESS:  James [name]  is a 62 y.o. male with a known history of stage IV lung cancer, past tobacco use, sinus tachycardia presents to the emergency room due to fever, shortness of breath and cough over the last 2 days.  Patient went to the cancer center for evaluation and was sent to the emergency room due to hypoxia, tachycardia and fever.  Found to have left upper and lower lobe pneumonia along with sepsis and acute hypoxic respiratory failure and is being admitted to the hospital.  No recent antibiotic use.  Patient has finished his last round of chemotherapy.  He is scheduled for a CT scan of the chest next week.  PAST MEDICAL HISTORY:   Past Medical History:  Diagnosis Date  . Cancer (James [name]) 12/01/2017   Primary cancer of right upper lobe of lung. Mets to liver, brain.  Marland Kitchen Prepatellar bursitis of left knee    patient denies    PAST SURGICAL HISTORY:   Past Surgical History:  Procedure Laterality Date  . collapsed lung  2012   "had to reinflate" " i carried large piece of sheet rock up stairs by myself "   . [place]   x2 , second was with mesh   . TOTAL HIP ARTHROPLASTY Right 04/09/2017   Procedure: RIGHT TOTAL HIP ARTHROPLASTY ANTERIOR APPROACH;  Surgeon: Mcarthur Rossetti, MD;  Location: [facility];  Service: Orthopedics;  Laterality: Right;  . URETHRAL STRICTURE DILATATION      SOCIAL HISTORY:   Social History   Tobacco Use  . Smoking status: Former Smoker    Types: Cigarettes    Last attempt to quit: 03/18/2012    Years since quitting: 6.0  . Smokeless tobacco: Never Used  . Tobacco comment: 5  years   Substance Use Topics  . Alcohol use: Yes    Comment: Socially    FAMILY HISTORY:  No family history on file. No lung cancer  DRUG ALLERGIES:  No Known Allergies  REVIEW OF SYSTEMS:   Review of Systems  Constitutional: Positive for chills, fever and malaise/fatigue.  HENT: Negative for sore throat.   Eyes: Negative for blurred vision, double vision and pain.  Respiratory: Positive for cough and shortness of breath. Negative for hemoptysis and wheezing.   Cardiovascular: Positive for chest pain. Negative for palpitations, orthopnea and leg swelling.  Gastrointestinal: Negative for abdominal pain, constipation, diarrhea, heartburn, nausea and vomiting.  Genitourinary: Negative for dysuria and hematuria.  Musculoskeletal: Negative for back pain and joint pain.  Skin: Negative for rash.  Neurological: Negative for sensory change, speech change, focal weakness and headaches.  Endo/Heme/Allergies: Does not bruise/bleed easily.  Psychiatric/Behavioral: Negative for depression. The patient is not nervous/anxious.     MEDICATIONS AT HOME:   Prior to Admission medications   Medication Sig Start Date End Date Taking? Authorizing Provider  albuterol (PROAIR HFA) 108 (90 Base) MCG/ACT inhaler Inhale 1-2 puffs into the lungs every 6 (six) hours as needed for wheezing or shortness of breath. 02/07/18  Yes Laverle Hobby, MD  Glycopyrrolate-Formoterol (BEVESPI AEROSPHERE) 9-4.8 MCG/ACT AERO Inhale 2 puffs into  the lungs daily. 02/07/18  Yes Laverle Hobby, MD  ipratropium-albuterol (DUONEB) 0.5-2.5 (3) MG/3ML SOLN Take 3 mLs by nebulization every 6 (six) hours as needed. Patient taking differently: Take 3 mLs by nebulization every 6 (six) hours as needed (for wheezing/shortness of breath).  03/07/18  Yes Cammie Sickle, MD  LORazepam (ATIVAN) 1 MG tablet Take 1 tablet (1 mg total) by mouth at bedtime as needed for anxiety. 03/14/18  Yes Cammie Sickle, MD   metoprolol tartrate (LOPRESSOR) 25 MG tablet Take 0.5 tablets (12.5 mg total) by mouth 2 (two) times daily. 03/07/18  Yes Dustin Flock, MD  ondansetron (ZOFRAN) 8 MG tablet One pill every 8 hours as needed for nausea/vomitting. Patient taking differently: Take 8 mg by mouth every 8 (eight) hours as needed for vomiting. One pill every 8 hours as needed for nausea/vomitting. 11/27/17  Yes Cammie Sickle, MD  prochlorperazine (COMPAZINE) 10 MG tablet Take 1 tablet (10 mg total) by mouth every 6 (six) hours as needed for nausea or vomiting. 11/27/17  Yes Cammie Sickle, MD  benzonatate (TESSALON) 200 MG capsule Take 1 capsule (200 mg total) by mouth 3 (three) times daily as needed for cough. Patient not taking: Reported on 03/24/2018 03/07/18   Dustin Flock, MD  guaiFENesin-dextromethorphan [facility] DM) 100-10 MG/5ML syrup Take 5 mLs by mouth every 4 (four) hours as needed for cough. Patient not taking: Reported on 03/24/2018 03/07/18   Dustin Flock, MD  potassium chloride SA (K-DUR,KLOR-CON) 20 MEQ tablet Take 1 tablet (20 mEq total) by mouth 2 (two) times daily for 4 days. Patient not taking: Reported on 03/24/2018 03/07/18 03/11/18  Dustin Flock, MD  SPIRIVA HANDIHALER 18 MCG inhalation capsule INHALE 1 CAPSULE VIA HANDIHALER ONCE DAILY AT [place] DAY Patient not taking: No sig reported 01/31/18   Laverle Hobby, MD     VITAL SIGNS:  Blood pressure 112/83, pulse (!) 128, temperature 97.9 F (36.6 C), resp. rate (!) 26, height 5\' 8"  (1.727 m), weight 61.7 kg, SpO2 95 %.  PHYSICAL EXAMINATION:  Physical Exam  GENERAL:  62 y.o.-year-old patient lying in the bed with conversational dyspnea EYES: Pupils equal, round, reactive to light and accommodation. No scleral icterus. Extraocular muscles intact.  HEENT: Head atraumatic, normocephalic. Oropharynx and nasopharynx clear. No oropharyngeal erythema, moist oral mucosa  NECK:  Supple, no jugular venous  distention. No thyroid enlargement, no tenderness.  LUNGS: Decreased air entry at the right base .  Rhonchi CARDIOVASCULAR: S1, S2 normal. No murmurs, rubs, or gallops.  ABDOMEN: Soft, nontender, nondistended. Bowel sounds present. No organomegaly or mass.  EXTREMITIES: No pedal edema, cyanosis, or clubbing. + 2 pedal & radial pulses b/l.   NEUROLOGIC: Cranial nerves II through XII are intact. No focal Motor or sensory deficits appreciated b/l PSYCHIATRIC: The patient is alert and oriented x 3. Good affect.  SKIN: No obvious rash, lesion, or ulcer.   LABORATORY PANEL:   CBC Recent Labs  Lab 03/24/18 1046  WBC 8.1  HGB 10.9*  HCT 31.8*  PLT 220   ------------------------------------------------------------------------------------------------------------------ Chemistries  Recent Labs  Lab 03/24/18 1046  NA 131*  K 4.3  CL 99  CO2 20*  GLUCOSE 109*  BUN 14  CREATININE 0.82  CALCIUM 8.4*  AST 42*  ALT 43  ALKPHOS 63  BILITOT 0.8   ------------------------------------------------------------------------------------------------------------------ Cardiac Enzymes Recent Labs  Lab 03/24/18 1046  TROPONINI 0.12*   ------------------------------------------------------------------------------------------------------------------ RADIOLOGY:  Dg Chest 2 View  Result Date: 03/24/2018 CLINICAL DATA:  Short of breath, cough, history of lung carcinoma with metastasis to the liver and brain EXAM: CHEST - 2 VIEW COMPARISON:  Portable chest x-ray of 03/05/2018 and CT chest of 02/04/2018 FINDINGS: There is more opacity now present within the left mid and lower lung suspicious for developing pneumonia. Somewhat coarse and prominent markings remain bilaterally as well. Mediastinal and hilar contours are unchanged and heart size is stable. The sclerotic thoracic vertebral body lesion is not well seen by plain film. IMPRESSION: 1. Increasing markings in the left upper and lower lung  suspicious for developing pneumonia. 2. Stable chronic fibrotic change as well. Electronically Signed   By: Ivar Drape M.D.   On: 03/24/2018 11:24   IMPRESSION AND PLAN:   *Left sided healthcare acquired pneumonia with likelihood of aspiration * Acute hypoxic respiratory failure *Sepsis IV fluids.  IV antibiotics with vancomycin, cefepime and Flagyl.  Culture sent and pending.  Sputum cultures ordered.  Urine strep and Legionella antigens ordered.  Pneumonia order set used.  Oxygen to keep saturations over 90%.  Nebulizers. Normal lactic acid Check CT scan of the chest to rule out PE Check flu test  *Stage IV lung cancer follows with Dr. Bradd Canary.  * DVT prophylaxis with Lovenox  All the records are reviewed and case discussed with ED provider. Management plans discussed with the patient, family and they are in agreement.  CODE STATUS: DO NOT RESUSCITATE  TOTAL TIME TAKING CARE OF THIS PATIENT: 40 minutes.   Neita Carp M.D on 03/24/2018 at 12:21 PM  Between 7am to 6pm - Pager - [phone]  After 6pm go to [email or web address] - password [email or web address] [facility]  Office  [phone]  CC: Primary care physician; Valerie Roys, DO  Note: This dictation was prepared with Dragon dictation along with smaller phrase technology. Any transcriptional errors that result from this process are unintentional.

## 2018-03-24 NOTE — ED Notes (Signed)
Patient is refusing flu swab at this time due to discomfort.  This RN has notified Dr. Burlene Arnt at this time.

## 2018-03-24 NOTE — ED Provider Notes (Addendum)
[facility] Emergency Department Provider Note  ____________________________________________   I have reviewed the triage vital signs and the nursing notes. Where available I have reviewed prior notes and, if possible and indicated, outside hospital notes.    HISTORY  Chief Complaint Chest Pain    HPI James [name] is a 62 y.o. male who unfortunately has significant lung cancer with metastatic disease, recently admitted and discharged October 21 for pneumonia, has had increased cough and weakness over the last couple days. Last chemotherapy was October 17. Patient has had fever this morning up to 100.4. Has not taken any antipyretics. Denies any chest pain however he complains of cough which is productive and shortness of breath. He feels the same as he did when he had his pneumonia. No dysuria no urinary frequency has not had his flu shot but has not been exposed to the flu the family knows about, however, there is fluid in the environment at this time. Patient has not had any diarrhea or other symptoms aside from cough and the sensitivity is having a pneumonia.     Past Medical History:  Diagnosis Date  . Cancer ([place]) 12/01/2017   Primary cancer of right upper lobe of lung. Mets to liver, brain.  Marland Kitchen Prepatellar bursitis of left knee    patient denies    Patient Active Problem List   Diagnosis Date Noted  . Malnutrition of moderate degree 03/07/2018  . Sepsis ([place]) 03/05/2018  . HCAP (healthcare-associated pneumonia) 03/05/2018  . Cancer, metastatic to bone ([place]) 01/18/2018  . Goals of care, counseling/discussion 12/10/2017  . Primary cancer of right upper lobe of lung ([place]) 11/23/2017  . Mass of upper lobe of right lung 11/08/2017  . Status post total replacement of right hip 04/09/2017  . Unilateral primary osteoarthritis, right hip 02/09/2017  . Chronic right-sided low back pain with left-sided sciatica 02/09/2017  . Pain in right hip 02/09/2017     Past Surgical History:  Procedure Laterality Date  . collapsed lung  2012   "had to reinflate" " i carried large piece of sheet rock up stairs by myself "   . [employer]   x2 , second was with mesh   . TOTAL HIP ARTHROPLASTY Right 04/09/2017   Procedure: RIGHT TOTAL HIP ARTHROPLASTY ANTERIOR APPROACH;  Surgeon: Mcarthur Rossetti, MD;  Location: [facility];  Service: Orthopedics;  Laterality: Right;  . URETHRAL STRICTURE DILATATION      Prior to Admission medications   Medication Sig Start Date End Date Taking? Authorizing Provider  albuterol (PROAIR HFA) 108 (90 Base) MCG/ACT inhaler Inhale 1-2 puffs into the lungs every 6 (six) hours as needed for wheezing or shortness of breath. 02/07/18   Laverle Hobby, MD  benzonatate (TESSALON) 200 MG capsule Take 1 capsule (200 mg total) by mouth 3 (three) times daily as needed for cough. 03/07/18   Dustin Flock, MD  Glycopyrrolate-Formoterol (BEVESPI AEROSPHERE) 9-4.8 MCG/ACT AERO Inhale 2 puffs into the lungs daily. 02/07/18   Laverle Hobby, MD  guaiFENesin-dextromethorphan (ROBITUSSIN DM) 100-10 MG/5ML syrup Take 5 mLs by mouth every 4 (four) hours as needed for cough. 03/07/18   Dustin Flock, MD  ipratropium-albuterol (DUONEB) 0.5-2.5 (3) MG/3ML SOLN Take 3 mLs by nebulization every 6 (six) hours as needed. 03/07/18   Cammie Sickle, MD  LORazepam (ATIVAN) 1 MG tablet Take 1 tablet (1 mg total) by mouth at bedtime as needed for anxiety. 03/14/18   Cammie Sickle, MD  metoprolol tartrate (  LOPRESSOR) 25 MG tablet Take 0.5 tablets (12.5 mg total) by mouth 2 (two) times daily. 03/07/18   Dustin Flock, MD  Multiple Vitamin (MULTIVITAMIN WITH MINERALS) TABS tablet Take 1 tablet by mouth daily.    [provider]  ondansetron (ZOFRAN) 8 MG tablet One pill every 8 hours as needed for nausea/vomitting. Patient taking differently: Take 8 mg by mouth every 8 (eight) hours as needed for  vomiting. One pill every 8 hours as needed for nausea/vomitting. 11/27/17   Cammie Sickle, MD  potassium chloride SA (K-DUR,KLOR-CON) 20 MEQ tablet Take 1 tablet (20 mEq total) by mouth 2 (two) times daily for 4 days. 03/07/18 03/11/18  Dustin Flock, MD  prochlorperazine (COMPAZINE) 10 MG tablet Take 1 tablet (10 mg total) by mouth every 6 (six) hours as needed for nausea or vomiting. 11/27/17   Cammie Sickle, MD  SPIRIVA HANDIHALER 18 MCG inhalation capsule INHALE 1 CAPSULE VIA HANDIHALER ONCE DAILY AT [place] Patient taking differently: Place 18 mcg into inhaler and inhale daily.  01/31/18   Laverle Hobby, MD    Allergies Patient has no known allergies.  No family history on file.  Social History Social History   Tobacco Use  . Smoking status: Former Smoker    Types: Cigarettes    Last attempt to quit: 03/18/2012    Years since quitting: 6.0  . Smokeless tobacco: Never Used  . Tobacco comment: 5 years   Substance Use Topics  . Alcohol use: Yes    Comment: Socially  . Drug use: Yes    Types: Marijuana    Comment: 1 month ago     Review of Systems Constitutional: + fever Eyes: No visual changes. ENT: No sore throat. No stiff neck no neck pain Cardiovascular: Denies chest pain. Respiratory: + shortness of breath. Gastrointestinal:   no vomiting.  No diarrhea.  No constipation. Genitourinary: Negative for dysuria. Musculoskeletal: Negative lower extremity swelling Skin: Negative for rash. Neurological: Negative for severe headaches, focal weakness or numbness.   ____________________________________________   PHYSICAL EXAM:  VITAL SIGNS: ED Triage Vitals  Enc Vitals Group     BP 03/24/18 1016 112/83     Pulse Rate 03/24/18 1014 (!) 128     Resp 03/24/18 1014 (!) 26     Temp 03/24/18 1016 97.9 F (36.6 C)     Temp src --      SpO2 03/24/18 1014 95 %     Weight 03/24/18 1014 136 lb (61.7 kg)     Height 03/24/18 1014 5\' 8"   (1.727 m)     Head Circumference --      Peak Flow --      Pain Score 03/24/18 1014 6     Pain Loc --      Pain Edu? --      Excl. in [place]? --     Constitutional:chronically ill appearing and generally weak, Eyes: Conjunctivae are normal Head: Atraumatic HEENT: No congestion/rhinnorhea. Mucous membranes are moist.  Oropharynx non-erythematous Neck:   Nontender with no meningismus, no masses, no stridor Cardiovascular: Normal rate, regular rhythm. Grossly normal heart sounds.  Good peripheral circulation. Respiratory: Normal respiratory effort. nasal rhonchi that do appear to clear with cough. Persistent cough noted Abdominal: Soft and nontender. No distention. No guarding no rebound Back:  There is no focal tenderness or step off.  there is no midline tenderness there are no lesions noted. there is no CVA tenderness Musculoskeletal: No lower extremity tenderness, no  upper extremity tenderness. No joint effusions, no DVT signs strong distal pulses no edema Neurologic:  Normal speech and language. No gross focal neurologic deficits are appreciated.  Skin:  Skin is warm, dry and intact. No rash noted. Psychiatric: Mood and affect are normal. Speech and behavior are normal.  ____________________________________________   LABS (all labs ordered are listed, but only abnormal results are displayed)  Labs Reviewed  CULTURE, BLOOD (ROUTINE X 2)  CULTURE, BLOOD (ROUTINE X 2)  LACTIC ACID, PLASMA  LACTIC ACID, PLASMA  COMPREHENSIVE METABOLIC PANEL  CBC WITH DIFFERENTIAL/PLATELET  URINALYSIS, ROUTINE W REFLEX MICROSCOPIC  INFLUENZA PANEL BY PCR (TYPE A & B)  TROPONIN I    Pertinent labs  results that were available during my care of the patient were reviewed by me and considered in my medical decision making (see chart for details). ____________________________________________  EKG  I personally interpreted any EKGs ordered by me or triage heart rate is tachycardic 120, appears to be  sinus rhythm normal axis no acute ST elevation or depression nonspecific ST changes ____________________________________________  RADIOLOGY  Pertinent labs & imaging results that were available during my care of the patient were reviewed by me and considered in my medical decision making (see chart for details). If possible, patient and/or family made aware of any abnormal findings.  No results found. ____________________________________________    PROCEDURES  Procedure(s) performed: None  Procedures  Critical Care performed: None  ____________________________________________   INITIAL IMPRESSION / ASSESSMENT AND PLAN / ED COURSE  Pertinent labs & imaging results that were available during my care of the patient were reviewed by me and considered in my medical decision making (see chart for details).  patient here with fever, cough and likely pneumonia by symptoms, he has not had chemotherapy in a few weeks fortunately. However, he has been recently hospitalized, we will empirically start sepsis protocol given his renal compromise state, I'll treat him for possible hospital-acquired pneumonia, we'll give him IV fluids and we'll check a flu, no other obvious source of infection noted. We will watch him closely here vital signs are reassuring less far. We will give him antipyretics.    ____________________________________________   FINAL CLINICAL IMPRESSION(S) / ED DIAGNOSES  Final diagnoses:  Fever, unspecified fever cause      This chart was dictated using voice recognition software.  Despite best efforts to proofread,  errors can occur which can change meaning.      Schuyler Amor, MD 03/24/18 1038    Schuyler Amor, MD 03/24/18 1143

## 2018-03-24 NOTE — Progress Notes (Signed)
Called to place pt on Cpap per MD order. Upon entering room pt does not having oxygen on, pt holding cannula in hand. Placed pt on Cpap with O2 and explained importance of wearing. Went back to check on pt 15 minutes later and pt has Cpap mask in his hand. Explained to pt again why he should keep the mask on and placed it back on him. RN Sarah aware.

## 2018-03-24 NOTE — ED Notes (Signed)
FIRST NURSE NOTE:  Pt arrived via w/c from [facility], per RN, pt is hypoxic, tachycardic, painful inspiration. Pt actively receiving chemo at this time. Oxygen 88% on room air, placed on 2L, pt has 20G IV and given 500cc of fluids.

## 2018-03-24 NOTE — ED Notes (Signed)
Date and time results received: 03/24/18 1137 (use smartphrase ".now" to insert current time)  Test: Troponin  Critical Value: 0.12  Name of Provider Notified: [place]   Orders Received? Or Actions Taken?: No orders received

## 2018-03-24 NOTE — Telephone Encounter (Signed)
Reviewed imaging concerning for bilateral infiltrates inflammation versus infection.  As patient is on immunotherapy-recommend starting steroids.  Solu-Medrol ordered.

## 2018-03-24 NOTE — Progress Notes (Signed)
   03/24/18 2200  Clinical Encounter Type  Visited With Patient  Visit Type Initial;Spiritual support  Referral From Nurse  Consult/Referral To Chaplain Colette Ribas)  Recommendations Follow-up  Spiritual Encounters  Spiritual Needs Emotional (Seeking reconciliation)  Stress Factors  Patient Stress Factors Loss of control (EOL, Life Review)   Chaplain met with patient who was restless, tired, and processing life events. Chaplain offered active listening, empathy, and reassurance. Chaplain gently led the patient to consider God's grace and compassion, as he considers his sins of omission. Secondly, the patient is troubled by the status of his family after his death, to the point of worrying about how grieving will affect their ability to make decisions. Chaplain listened and provided a calming presence. Chaplain will visit the patient again, if the opportunity arises.

## 2018-03-24 NOTE — Progress Notes (Signed)
Spoke with Dr Duane Boston.  Pt still with increased respirations and hear rate.  Will hold lopressor po at this time due to work of breathing from infection.  Do not want to drop bp at this time per Dr Duane Boston.  Will order cpap to reduce effort of breathing. Pt also asking to stop fluids because of frequency of urinating.  Order for fluids reduced to 75,  Explained to pt what plan of care is and he agrees to cpap.  I asked if he wanted me to call his wife since this is a change and he said no. Dorna Bloom RN

## 2018-03-24 NOTE — Consult Note (Signed)
Pharmacy Antibiotic Note  James [name] is a 62 y.o. male admitted on 03/24/2018 with pneumonia.  Pharmacy has been consulted for Vancomycin/Cefepime dosing.  Plan: Vancomycin 1000 IV every 12 hours.  Goal trough 15-20 mcg/mL.  Stacked dosing 6 hours, will measure VT 1/2 hour prior to 4th dose  Vd (liters): 43.2   (factor used: 0.7 L/kg)  Kel (hr-1): 0.070     Half life (hrs):  9.90  Cefepime 2g IV every 8 hours  Will follow pt renal function at a minimum of every 3 days  Height: 5\' 8"  (172.7 cm) Weight: 136 lb (61.7 kg) IBW/kg (Calculated) : 68.4  Temp (24hrs), Avg:99.2 F (37.3 C), Min:97.9 F (36.6 C), Max:100.4 F (38 C)  Recent Labs  Lab 03/24/18 1045 03/24/18 1046  WBC  --  8.1  CREATININE  --  0.82  LATICACIDVEN 1.5  --     Estimated Creatinine Clearance: 79.4 mL/min (by C-G formula based on SCr of 0.82 mg/dL).    No Known Allergies  Antimicrobials this admission: Vancomycin 11/7 >>   Cefepime 11/7 >>   Dose adjustments this admission: N/A  Microbiology results: 11/07 BCx: pending   11/07 Sputum: pending  11/07 MRSA PCR: pending  Thank you for allowing pharmacy to be a part of this patient's care.  Lu Duffel, PharmD Clinical Pharmacist 03/24/2018 12:46 PM

## 2018-03-24 NOTE — ED Notes (Signed)
Dr. Burlene Arnt notified pt is possible sepsis patient due to tachycardia and hypoxia and recent fevers.

## 2018-03-24 NOTE — Progress Notes (Signed)
Advance care planning  Purpose of Encounter Pneumonia, lung cancer, code status  Parties in Attendance Patient and his healthcare power of attorney wife  Patients Decisional capacity Patient is alert and oriented.  Able to make medical decisions  Patient has documented healthcare power of attorney his wife.  We discussed regarding patient's pneumonia, treatment plan and prognosis.  He has finished his last round of chemotherapy and is waiting for a repeat CT scan and plan is for immunotherapy next.  He is hopeful that the cancer would be much improved.  Discussed regarding CODE STATUS.  Patient wants to be DO NOT RESUSCITATE DO NOT INTUBATE.  CODE STATUS changed and orders entered  Time spent - 17 minutes

## 2018-03-25 ENCOUNTER — Other Ambulatory Visit: Payer: Self-pay | Admitting: Internal Medicine

## 2018-03-25 DIAGNOSIS — C3411 Malignant neoplasm of upper lobe, right bronchus or lung: Secondary | ICD-10-CM

## 2018-03-25 DIAGNOSIS — C7931 Secondary malignant neoplasm of brain: Secondary | ICD-10-CM

## 2018-03-25 DIAGNOSIS — F064 Anxiety disorder due to known physiological condition: Secondary | ICD-10-CM

## 2018-03-25 DIAGNOSIS — Z87891 Personal history of nicotine dependence: Secondary | ICD-10-CM

## 2018-03-25 DIAGNOSIS — C7951 Secondary malignant neoplasm of bone: Secondary | ICD-10-CM

## 2018-03-25 DIAGNOSIS — Z9221 Personal history of antineoplastic chemotherapy: Secondary | ICD-10-CM

## 2018-03-25 DIAGNOSIS — C787 Secondary malignant neoplasm of liver and intrahepatic bile duct: Secondary | ICD-10-CM

## 2018-03-25 DIAGNOSIS — J9600 Acute respiratory failure, unspecified whether with hypoxia or hypercapnia: Secondary | ICD-10-CM

## 2018-03-25 LAB — EXPECTORATED SPUTUM ASSESSMENT W REFEX TO RESP CULTURE

## 2018-03-25 LAB — BASIC METABOLIC PANEL
Anion gap: 9 (ref 5–15)
BUN: 13 mg/dL (ref 8–23)
CO2: 21 mmol/L — ABNORMAL LOW (ref 22–32)
Calcium: 7.6 mg/dL — ABNORMAL LOW (ref 8.9–10.3)
Chloride: 106 mmol/L (ref 98–111)
Creatinine, Ser: 0.75 mg/dL (ref 0.61–1.24)
GFR calc non Af Amer: >60 mL/min (ref >60)
Glucose, Bld: 169 mg/dL — ABNORMAL HIGH (ref 70–99)
POTASSIUM: 4.3 mmol/L (ref 3.5–5.1)
SODIUM: 136 mmol/L (ref 135–145)

## 2018-03-25 LAB — EXPECTORATED SPUTUM ASSESSMENT W GRAM STAIN, RFLX TO RESP C

## 2018-03-25 LAB — CBC
HCT: 31.5 % — ABNORMAL LOW (ref 39.0–52.0)
HEMOGLOBIN: 10.4 g/dL — AB (ref 13.0–17.0)
MCH: 33 pg (ref 26.0–34.0)
MCHC: 33 g/dL (ref 30.0–36.0)
MCV: 100 fL (ref 80.0–100.0)
NRBC: 0 % (ref 0.0–0.2)
PLATELETS: 182 10*3/uL (ref 150–400)
RBC: 3.15 MIL/uL — ABNORMAL LOW (ref 4.22–5.81)
RDW: 13.7 % (ref 11.5–15.5)
WBC: 7.2 10*3/uL (ref 4.0–10.5)

## 2018-03-25 MED ORDER — LEVALBUTEROL HCL 1.25 MG/0.5ML IN NEBU
1.2500 mg | INHALATION_SOLUTION | RESPIRATORY_TRACT | Status: DC | PRN
  Filled 2018-03-25: qty 0.5

## 2018-03-25 MED ORDER — SODIUM CHLORIDE 0.9 % IV SOLN
INTRAVENOUS | Status: DC | PRN
  Administered 2018-03-25 – 2018-04-01 (×5): 500 mL via INTRAVENOUS

## 2018-03-25 MED ORDER — ENSURE ENLIVE PO LIQD: 237.0000 mL | Freq: Three times a day (TID) | ORAL | Status: DC

## 2018-03-25 MED ORDER — PANTOPRAZOLE SODIUM 20 MG PO TBEC
20.0000 mg | DELAYED_RELEASE_TABLET | Freq: Two times a day (BID) | ORAL | Status: DC
  Administered 2018-03-26 – 2018-04-04 (×19): 20 mg via ORAL
  Filled 2018-03-25 (×22): qty 1

## 2018-03-25 NOTE — Consult Note (Signed)
Pharmacy Antibiotic Note  James [name] is a 62 y.o. male admitted on 03/24/2018 with pneumonia.  Pharmacy has been consulted for Vancomycin/Cefepime dosing. Vancomycin discontinued 11/8.  Plan: Continue Cefepime 2g IV every 8 hours  Will follow pt renal function at a minimum of every 3 days  Height: 5\' 10"  (177.8 cm) Weight: 139 lb 9.6 oz (63.3 kg) IBW/kg (Calculated) : 73  Temp (24hrs), Avg:97.8 F (36.6 C), Min:97.4 F (36.3 C), Max:98 F (36.7 C)  Recent Labs  Lab 03/24/18 1045 03/24/18 1046 03/24/18 1502 03/25/18 0517  WBC  --  8.1  --  7.2  CREATININE  --  0.82  --  0.75  LATICACIDVEN 1.5  --  1.1  --     Estimated Creatinine Clearance: 83.5 mL/min (by C-G formula based on SCr of 0.75 mg/dL).    No Known Allergies  Antimicrobials this admission: Flagyl 11/7>>11/8 Vancomycin 11/7 >>  11/8 Cefepime 11/7 >>   Dose adjustments this admission: N/A  Microbiology results: 11/07 BCx: NG TD 11/07 Sputum: pending  11/07 MRSA PCR: negative   Thank you for allowing pharmacy to be a part of this patient's care. Pernell Dupre, PharmD, BCPS Clinical Pharmacist 03/25/2018 2:10 PM

## 2018-03-25 NOTE — Consult Note (Addendum)
[facility] CONSULT NOTE  Patient Care Team: Valerie Roys, DO as PCP - General (Family Medicine) Telford Nab, RN as Registered Nurse Rogue Bussing, Elisha Headland, MD as Medical Oncologist (Medical Oncology)  CHIEF COMPLAINTS/PURPOSE OF CONSULTATION:  Lung cancer respiratory failure  HISTORY OF PRESENTING ILLNESS:  James [name] 62 y.o.  male with a history of adenocarcinoma the lung status post 4 cycles of induction chemotherapy with carbotaxol+atezo+ bevacizumab is currently admitted to the hospital for worsening shortness of breath.  Patient had last cycle #4 of chemotherapy approximately 3 weeks ago.  As per the wife patient noted to have progressive shortness of breath of the last 4 to 5 days.  Low-grade temperatures.  On evaluation the clinic-patient noted to have respiratory rate of 24-26; tachycardic at 120s.  Patient was referred to the emergency room.  On further evaluation patient noted to have groundglass opacities on chest x-ray.  CT scan showed bilateral groundglass opacities left more than right and stable lung cancer/mediastinal adenopathy.  Overnight patient has been intermittently delirious.  Patient overnight rested in CPAP.  This morning patient feels better.  However continues to have cough.  No chest pain.  Appetite is poor.   ROS: A complete 10 point review of system is done which is negative except mentioned above in history of present illness  MEDICAL HISTORY:  Past Medical History:  Diagnosis Date  . Cancer ([place]) 12/01/2017   Primary cancer of right upper lobe of lung. Mets to liver, brain.  Marland Kitchen Prepatellar bursitis of left knee    patient denies    SURGICAL HISTORY: Past Surgical History:  Procedure Laterality Date  . collapsed lung  2012   "had to reinflate" " i carried large piece of sheet rock up stairs by myself "   . [place]   x2 , second was with mesh   . TOTAL HIP ARTHROPLASTY Right 04/09/2017   Procedure: RIGHT  TOTAL HIP ARTHROPLASTY ANTERIOR APPROACH;  Surgeon: Mcarthur Rossetti, MD;  Location: [facility];  Service: Orthopedics;  Laterality: Right;  . URETHRAL STRICTURE DILATATION      SOCIAL HISTORY: Social History   Socioeconomic History  . Marital status: Married    Spouse name: Not on file  . Number of children: Not on file  . Years of education: Not on file  . Highest education level: Not on file  Occupational History  . Not on file  Social Needs  . Financial resource strain: Not on file  . Food insecurity:    Worry: Not on file    Inability: Not on file  . Transportation needs:    Medical: Not on file    Non-medical: Not on file  Tobacco Use  . Smoking status: Former Smoker    Types: Cigarettes    Last attempt to quit: 03/18/2012    Years since quitting: 6.0  . Smokeless tobacco: Never Used  . Tobacco comment: 5 years   Substance and Sexual Activity  . Alcohol use: Yes    Comment: Socially  . Drug use: Yes    Types: Marijuana    Comment: 1 month ago   . Sexual activity: Not Currently  Lifestyle  . Physical activity:    Days per week: Not on file    Minutes per session: Not on file  . Stress: Not on file  Relationships  . Social connections:    Talks on phone: Not on file    Gets together: Not on file  Attends religious service: Not on file    Active member of club or organization: Not on file    Attends meetings of clubs or organizations: Not on file    Relationship status: Not on file  . Intimate partner violence:    Fear of current or ex partner: Not on file    Emotionally abused: Not on file    Physically abused: Not on file    Forced sexual activity: Not on file  Other Topics Concern  . Not on file  Social History Narrative   Father and mother with hypertension.     FAMILY HISTORY: No family history on file.  ALLERGIES:  has No Known Allergies.  MEDICATIONS:  Current Facility-Administered Medications  Medication Dose Route Frequency Provider  Last Rate Last Dose  . 0.9 %  sodium chloride infusion   Intravenous PRN Epifanio Lesches, MD 10 mL/hr at 03/25/18 2137 500 mL at 03/25/18 2137  . acetaminophen (TYLENOL) tablet 650 mg  650 mg Oral Q6H PRN Hillary Bow, MD   650 mg at 03/25/18 1717   Or  . acetaminophen (TYLENOL) suppository 650 mg  650 mg Rectal Q6H PRN Hillary Bow, MD      . ALPRAZolam Duanne Moron) tablet 1 mg  1 mg Oral TID PRN Dustin Flock, MD   1 mg at 03/25/18 2029  . benzonatate (TESSALON) capsule 200 mg  200 mg Oral TID PRN Sudini, Alveta Heimlich, MD      . ceFEPIme (MAXIPIME) 2 g in sodium chloride 0.9 % 100 mL IVPB  2 g Intravenous Q8H Lu Duffel, RPH 200 mL/hr at 03/25/18 2140 2 g at 03/25/18 2140  . enoxaparin (LOVENOX) injection 40 mg  40 mg Subcutaneous Q2200 Hillary Bow, MD   40 mg at 03/25/18 2140  . feeding supplement (ENSURE ENLIVE) (ENSURE ENLIVE) liquid 237 mL  237 mL Oral TID BM Gladstone Lighter, MD      . guaiFENesin-dextromethorphan (ROBITUSSIN DM) 100-10 MG/5ML syrup 5 mL  5 mL Oral Q4H PRN Sudini, Srikar, MD      . levalbuterol (XOPENEX) nebulizer solution 1.25 mg  1.25 mg Nebulization Q2H PRN Cammie Sickle, MD      . LORazepam (ATIVAN) tablet 1 mg  1 mg Oral QHS PRN Sudini, Alveta Heimlich, MD      . methylPREDNISolone sodium succinate (SOLU-MEDROL) 125 mg/2 mL injection 60 mg  60 mg Intravenous Q6H Cammie Sickle, MD   60 mg at 03/25/18 1836  . metoprolol tartrate (LOPRESSOR) injection 5 mg  5 mg Intravenous Q4H PRN Dustin Flock, MD   5 mg at 03/24/18 2042  . metoprolol tartrate (LOPRESSOR) tablet 12.5 mg  12.5 mg Oral BID Hillary Bow, MD   12.5 mg at 03/25/18 2140  . mometasone-formoterol (DULERA) 100-5 MCG/ACT inhaler 2 puff  2 puff Inhalation BID Hillary Bow, MD   2 puff at 03/25/18 2140  . ondansetron (ZOFRAN) tablet 4 mg  4 mg Oral Q6H PRN Hillary Bow, MD       Or  . ondansetron (ZOFRAN) injection 4 mg  4 mg Intravenous Q6H PRN Hillary Bow, MD   4 mg at 03/24/18  1736  . pantoprazole (PROTONIX) EC tablet 20 mg  20 mg Oral BID Charlaine Dalton R, MD      . polyethylene glycol (MIRALAX / GLYCOLAX) packet 17 g  17 g Oral Daily PRN Sudini, Srikar, MD      . sodium chloride flush (NS) 0.9 % injection 3 mL  3 mL Intravenous Q12H Sudini,  Srikar, MD   3 mL at 03/25/18 0859  . zolpidem (AMBIEN) tablet 10 mg  10 mg Oral QHS PRN Hillary Bow, MD   10 mg at 03/25/18 2140      .  PHYSICAL EXAMINATION:  Vitals:   03/25/18 1250 03/25/18 1939  BP: 114/77 (!) 137/94  Pulse: 87 (!) 109  Resp: (!) 22   Temp: 97.6 F (36.4 C) (!) 96.5 F (35.8 C)  SpO2: 96% 95%   Filed Weights   03/24/18 1014 03/24/18 1347 03/25/18 0415  Weight: 136 lb (61.7 kg) 140 lb 10.5 oz (63.8 kg) 139 lb 9.6 oz (63.3 kg)    GENERAL: Thin cachectic male patient resting in the bed.  No acute distress.  Currently on CPAP. EYES: no pallor or icterus OROPHARYNX: no thrush or ulceration. NECK: supple, no masses felt LYMPH:  no palpable lymphadenopathy in the cervical, axillary or inguinal regions LUNGS: decreased breath sounds to auscultation at bases and  No wheeze or crackles HEART/CVS: regular rate & rhythm and no murmurs; No lower extremity edema ABDOMEN: abdomen soft, non-tender and normal bowel sounds Musculoskeletal:no cyanosis of digits and no clubbing  PSYCH: alert & oriented x 3 with fluent speech NEURO: no focal motor/sensory deficits SKIN:  no rashes or significant lesions  LABORATORY DATA:  I have reviewed the data as listed Lab Results  Component Value Date   WBC 7.2 03/25/2018   HGB 10.4 (L) 03/25/2018   HCT 31.5 (L) 03/25/2018   MCV 100.0 03/25/2018   PLT 182 03/25/2018   Recent Labs    03/02/18 0900 03/04/18 2219  03/14/18 1253 03/24/18 1046 03/25/18 0517  NA 137 134*   < > 134* 131* 136  K 4.1 4.0   < > 4.5 4.3 4.3  CL 106 101   < > 103 99 106  CO2 24 24   < > 23 20* 21*  GLUCOSE 167* 116*   < > 116* 109* 169*  BUN 12 13   < > 14 14 13    CREATININE 0.91 1.06   < > 1.00 0.82 0.75  CALCIUM 8.4* 8.5*   < > 8.9 8.4* 7.6*  GFRNONAA >60 >60   < > >60 >60 >60  GFRAA >60 >60   < > >60 >60 >60  PROT 7.0 6.6  --   --  7.2  --   ALBUMIN 3.8 3.5  --   --  3.0*  --   AST 24 28  --   --  42*  --   ALT 16 15  --   --  43  --   ALKPHOS 52 49  --   --  63  --   BILITOT 0.5 0.9  --   --  0.8  --    < > = values in this interval not displayed.    RADIOGRAPHIC STUDIES: I have personally reviewed the radiological images as listed and agreed with the findings in the report. Dg Chest 2 View  Result Date: 03/24/2018 CLINICAL DATA:  Short of breath, cough, history of lung carcinoma with metastasis to the liver and brain EXAM: CHEST - 2 VIEW COMPARISON:  Portable chest x-ray of 03/05/2018 and CT chest of 02/04/2018 FINDINGS: There is more opacity now present within the left mid and lower lung suspicious for developing pneumonia. Somewhat coarse and prominent markings remain bilaterally as well. Mediastinal and hilar contours are unchanged and heart size is stable. The sclerotic thoracic vertebral body lesion is not well seen  by plain film. IMPRESSION: 1. Increasing markings in the left upper and lower lung suspicious for developing pneumonia. 2. Stable chronic fibrotic change as well. Electronically Signed   By: Ivar Drape M.D.   On: 03/24/2018 11:24   Dg Chest 2 View  Result Date: 03/05/2018 CLINICAL DATA:  Fever EXAM: CHEST - 2 VIEW COMPARISON:  CT 02/04/2018, radiograph 11/01/2017 FINDINGS: No pleural effusion. Emphysematous disease. Coarse chronic interstitial opacity. Increased left upper and peripheral nodular opacity. Decreased nodularity in the right upper lobe. Normal heart size. No pneumothorax. IMPRESSION: 1. Increased peripheral mid to upper lung opacity, slightly nodular in appearance, possible acute infection or inflammatory process. Lung nodules in this patient with history of lung cancer are also considered. 2. Underlying  emphysematous disease and coarse interstitial disease. Slight decrease nodularity in the right upper lobe since prior radiograph. Electronically Signed   By: Donavan Foil M.D.   On: 03/05/2018 00:16   Ct Angio Chest Pe W Or Wo Contrast  Result Date: 03/24/2018 CLINICAL DATA:  Pt sent from the [facility] with c/o chest pain for the past couple of days with fever. States O2 sats were low and placed pt on 2L [place] PTA. Patient with lung carcinoma. Was on chemotherapy at the time of the CT dated 02/04/2018. EXAM: CT ANGIOGRAPHY CHEST WITH CONTRAST TECHNIQUE: Multidetector CT imaging of the chest was performed using the standard protocol during bolus administration of intravenous contrast. Multiplanar CT image reconstructions and MIPs were obtained to evaluate the vascular anatomy. CONTRAST:  62mL ISOVUE-370 IOPAMIDOL (ISOVUE-370) INJECTION 76% COMPARISON:  Current chest radiograph, and chest CTs 11/05/2017 and 02/04/2018. FINDINGS: Cardiovascular: There is satisfactory opacification of the pulmonary arteries to the segmental level. There is no evidence of a pulmonary embolism. Heart is normal in size. No pericardial effusion. Minor left coronary artery calcifications. Aorta is normal in caliber. No dissection. Minimal distal descending thoracic atherosclerosis. Mild atherosclerosis at the origin of the arch branch vessels without significant stenosis. Mediastinum/Nodes: Mildly enlarged, shotty left para carinal lymph node measuring 12 mm short axis, 16 mm on the exam dated 02/04/2018. Right subcarinal node measuring 13 mm in short axis, previously 13 mm. No new prominent or enlarged lymph nodes. No mediastinal masses. No neck base or axillary masses or enlarged lymph nodes. Soft tissue thickening along the right hilar structures is unchanged from the most recent prior study. No discrete hilar masses or enlarged lymph nodes. Lungs/Pleura: There are new areas of bilateral airspace disease. Ground-glass opacities and  patchy peribronchovascular consolidation is noted in the left upper lobe. Ground-glass opacity with dependent consolidation is noted in the posterior left lower lobe extending to the lung base. There is left lower lobe peribronchovascular consolidation, most confluent at posterior lung base. Bilateral peribronchial thickening is noted similar to the most recent prior study. Other findings seen previously including soft tissue thickening along the right upper lobe bronchus and areas of nodular opacity are stable from the most recent prior exam consistent with the patient's known carcinoma. There is stable interstitial thickening most evident in the lower lungs. Trace left pleural effusion.  No pneumothorax. Upper Abdomen: No acute abnormality. Musculoskeletal: Stable 13 mm colonic lesion in T4. No new bone lesions. No fracture or acute finding. Review of the MIP images confirms the above findings. IMPRESSION: 1. No evidence of a pulmonary embolism. 2. Bilateral areas of ground-glass and more confluent lung opacity consistent with multifocal infection or inflammation, new since the prior chest, abdomen and pelvis CT from 02/04/2018. 3. Findings  of lung carcinoma described previously are without significant change. Aortic Atherosclerosis (ICD10-I70.0) and Emphysema (ICD10-J43.9). Electronically Signed   By: Lajean Manes M.D.   On: 03/24/2018 13:11   Mr Jeri Cos PZ Contrast  Result Date: 02/24/2018 CLINICAL DATA:  Right occipital brain metastasis treated with radiotherapy in June 2019. EXAM: MRI HEAD WITHOUT AND WITH CONTRAST TECHNIQUE: Multiplanar, multiecho pulse sequences of the brain and surrounding structures were obtained without and with intravenous contrast. CONTRAST:  35mL MULTIHANCE GADOBENATE DIMEGLUMINE 529 MG/ML IV SOLN COMPARISON:  11/17/2017 FINDINGS: Brain: Treated right occipital lobe metastasis is now mildly enhancing and significantly decreased in size (15 mm in maximum as compared to 26 mm  previously). No residual vasogenic edema. No new or untreated metastasis is seen. No infarct, hemorrhage, hydrocephalus, or collection. Vascular: Major flow voids and vascular enhancements are preserved Skull and upper cervical spine: Subcentimeter structure in the anterior dens is stable. Sinuses/Orbits: Negative IMPRESSION: 1. Treated right occipital lobe metastasis with positive treatment response. 2. No new metastasis. Electronically Signed   By: Monte Fantasia M.D.   On: 02/24/2018 14:56   Dg Chest Port 1 View  Result Date: 03/05/2018 CLINICAL DATA:  Cough, metastatic lung cancer EXAM: PORTABLE CHEST 1 VIEW COMPARISON:  Chest radiograph from one day prior. FINDINGS: Stable cardiomediastinal silhouette with normal heart size. No pneumothorax. No pleural effusion. Extensive patchy reticulonodular opacities throughout both lungs have not appreciably changed. No acute superimposed consolidative airspace disease. IMPRESSION: Stable extensive patchy reticulonodular opacities throughout both lungs, favor lymphangitic tumor. No acute superimposed consolidative airspace disease. Electronically Signed   By: Ilona Sorrel M.D.   On: 03/05/2018 21:59    ASSESSMENT & PLAN:   #62 year old male patient history of metastatic adenocarcinoma the lung currently status post 4 cycles of chemo immunotherapy is admitted the hospital for worsening shortness of breath  # Metastatic adenocarcinoma the lung-currently status post 4 cycles of carbotaxol-atezo+bev-CT scan shows stable disease the lung [however see discussion below]; disease in the liver cannot be evaluated.  Patient will need a CT scan of the abdomen pelvis when acute issues resolve.  #Acute respiratory failure needing oxygen-bilateral groundglass infiltrates left more than right-suspicious of inflammatory pneumonitis secondary to immunotherapy/possible infectious pneumonia [less likely lymphangitic spread].  Patient started on Solu-Medrol 60 mg every 6 hours.   Patient will likely need slow taper based upon response to therapy.  Continue IV antibiotics.  #Brain metastasis solitary status post SBRT.  Stable.  #Agitation/anxiety secondary to delirium hospitalizations.  Stable.  #DVT/GI prophylaxis.  #Overall prognosis is guarded-discussed with the patient's wife in detail.   Thank you Dr.Kalisetti for allowing me to participate in the care of your pleasant patient. Please do not hesitate to contact me with questions or concerns in the interim.  Discussed with Dr. Tressia Miners.  Reviewed the above plan with patient's wife at length.  Also discussed with respiratory therapist.  # I reviewed the blood work- with the patient in detail; also reviewed the imaging independently [as summarized above]; and with the patient in detail.   #Dr. Grayland Ormond is on call/available over the weekend-for any urgent concerns issues.   Cammie Sickle, MD 03/25/2018 10:06 PM

## 2018-03-25 NOTE — Progress Notes (Signed)
Initial Nutrition Assessment  DOCUMENTATION CODES:   Not applicable  INTERVENTION:  Ensure Enlive po TID, each supplement provides 350 kcal and 20 grams of protein Pt prefers strawberry  Magic cup TID with meals, each supplement provides 290 kcal and 9 grams of protein    NUTRITION DIAGNOSIS:   Predicted suboptimal nutrient intake related to chronic illness(current pnemonia w/stage IV lung cancer) as evidenced by estimated needs.   GOAL:   Patient will meet greater than or equal to 90% of their needs   MONITOR:   PO intake, Supplement acceptance, Weight trends  REASON FOR ASSESSMENT:   Malnutrition Screening Tool    ASSESSMENT:  62 year old male w/ stage IV lung cancer and brain, liver mets who presented to ED for fever and SOB x 2days and admitted w/ upper/lower lobe pneumonia. Last chemo completed 10/17 and recent [facility] discharge 10/21 for pneumonia. PMH:  Hernia repair x2, total hip arthroplasty.   Pt sleeping soundly with cool towels wrapped around his neck at time of visit. RD will attempt to see pt another time  Pt seen by RD on 10/20 during previous admission. Moderate malnutrition diagnosis is suspected to remain appropriate for patient at this time.  Medications: methylprednisolone, Flagyl, Vancocin Labs: Glucose 169 (H) Ca w/out albumin for correction: 7.6 (L)   NUTRITION - FOCUSED PHYSICAL EXAM: Pt sleeping at visit; unable to access Moderate mal dx 10/20; suspect diagnosis remains appropriate   Diet Order:   Diet Order            Diet regular Room service appropriate? Yes; Fluid consistency: Thin  Diet effective now              EDUCATION NEEDS:   No education needs have been identified at this time  Skin:  Skin Assessment: Reviewed RN Assessment(ecchymosis bilateral arms)  Last BM:  03/24/2018 - [facility]  Height:   Ht Readings from Last 1 Encounters:  03/24/18 5\' 10"  (1.778 m)    Weight:   Wt Readings from Last 1 Encounters:   03/25/18 63.3 kg   03/14/18 61.7 kg  03/05/18 66.2 kg  03/02/18 63.5 kg  02/08/18 65.3 kg  02/07/18 64.9 kg  01/18/18 66.7 kg    Ideal Body Weight:  75 kg  BMI:  Body mass index is 20.03 kg/m.  Estimated Nutritional Needs:   Kcal:  5465-6812  Protein:  82-95  Fluid:  1.8-2.1L    Lajuan Lines, RD, LDN  After Hours/Weekend Pager: [phone]

## 2018-03-25 NOTE — Progress Notes (Signed)
Pt asleep, resting quietly with cpap on. Dorna Bloom RN

## 2018-03-25 NOTE — Progress Notes (Signed)
[place] at [place] NAME: James [name]    MR#:  [id number]  DATE OF BIRTH:  1956-01-14  SUBJECTIVE:  CHIEF COMPLAINT:   Chief Complaint  Patient presents with  . Chest Pain   -Still has some dyspnea.  Feels extremely weak and tired.  Still requiring oxygen which is acute  REVIEW OF SYSTEMS:  Review of Systems  Constitutional: Positive for malaise/fatigue. Negative for chills and fever.  HENT: Negative for ear discharge, hearing loss and nosebleeds.   Respiratory: Positive for shortness of breath. Negative for cough and wheezing.   Cardiovascular: Negative for chest pain and palpitations.  Gastrointestinal: Negative for abdominal pain, constipation, diarrhea, nausea and vomiting.  Genitourinary: Negative for dysuria.  Musculoskeletal: Negative for myalgias.  Neurological: Negative for dizziness, focal weakness, seizures, weakness and headaches.  Psychiatric/Behavioral: Negative for depression.    DRUG ALLERGIES:  No Known Allergies  VITALS:  Blood pressure 114/77, pulse 87, temperature 97.6 F (36.4 C), temperature source Oral, resp. rate (!) 22, height 5\' 10"  (1.778 m), weight 63.3 kg, SpO2 96 %.  PHYSICAL EXAMINATION:  Physical Exam   GENERAL:  62 y.o.-year-old patient lying in the bed with no acute distress.  EYES: Pupils equal, round, reactive to light and accommodation. No scleral icterus. Extraocular muscles intact.  HEENT: Head atraumatic, normocephalic. Oropharynx and nasopharynx clear.  NECK:  Supple, no jugular venous distention. No thyroid enlargement, no tenderness.  LUNGS: Normal breath sounds bilaterally, has left sided rales posteriorly, no wheezing, rhonchi or crepitation. No use of accessory muscles of respiration.  CARDIOVASCULAR: S1, S2 normal. No  rubs, or gallops. 2/6 systolic murmur present ABDOMEN: Soft, nontender, nondistended. Bowel sounds present. No organomegaly or mass.  EXTREMITIES: No pedal edema,  cyanosis, or clubbing.  NEUROLOGIC: Cranial nerves II through XII are intact. Muscle strength 5/5 in all extremities. Sensation intact. Gait not checked.  PSYCHIATRIC: The patient is alert and oriented x 3.  SKIN: No obvious rash, lesion, or ulcer.    LABORATORY PANEL:   CBC Recent Labs  Lab 03/25/18 0517  WBC 7.2  HGB 10.4*  HCT 31.5*  PLT 182   ------------------------------------------------------------------------------------------------------------------  Chemistries  Recent Labs  Lab 03/24/18 1046 03/25/18 0517  NA 131* 136  K 4.3 4.3  CL 99 106  CO2 20* 21*  GLUCOSE 109* 169*  BUN 14 13  CREATININE 0.82 0.75  CALCIUM 8.4* 7.6*  AST 42*  --   ALT 43  --   ALKPHOS 63  --   BILITOT 0.8  --    ------------------------------------------------------------------------------------------------------------------  Cardiac Enzymes Recent Labs  Lab 03/24/18 2206  TROPONINI 0.13*   ------------------------------------------------------------------------------------------------------------------  RADIOLOGY:  Dg Chest 2 View  Result Date: 03/24/2018 CLINICAL DATA:  Short of breath, cough, history of lung carcinoma with metastasis to the liver and brain EXAM: CHEST - 2 VIEW COMPARISON:  Portable chest x-ray of 03/05/2018 and CT chest of 02/04/2018 FINDINGS: There is more opacity now present within the left mid and lower lung suspicious for developing pneumonia. Somewhat coarse and prominent markings remain bilaterally as well. Mediastinal and hilar contours are unchanged and heart size is stable. The sclerotic thoracic vertebral body lesion is not well seen by plain film. IMPRESSION: 1. Increasing markings in the left upper and lower lung suspicious for developing pneumonia. 2. Stable chronic fibrotic change as well. Electronically Signed   By: Ivar Drape M.D.   On: 03/24/2018 11:24   Ct Angio Chest Pe W Or Wo  Contrast  Result Date: 03/24/2018 CLINICAL DATA:  Pt sent from  the CA center with c/o chest pain for the past couple of days with fever. States O2 sats were low and placed pt on 2L [place] PTA. Patient with lung carcinoma. Was on chemotherapy at the time of the CT dated 02/04/2018. EXAM: CT ANGIOGRAPHY CHEST WITH CONTRAST TECHNIQUE: Multidetector CT imaging of the chest was performed using the standard protocol during bolus administration of intravenous contrast. Multiplanar CT image reconstructions and MIPs were obtained to evaluate the vascular anatomy. CONTRAST:  42mL ISOVUE-370 IOPAMIDOL (ISOVUE-370) INJECTION 76% COMPARISON:  Current chest radiograph, and chest CTs 11/05/2017 and 02/04/2018. FINDINGS: Cardiovascular: There is satisfactory opacification of the pulmonary arteries to the segmental level. There is no evidence of a pulmonary embolism. Heart is normal in size. No pericardial effusion. Minor left coronary artery calcifications. Aorta is normal in caliber. No dissection. Minimal distal descending thoracic atherosclerosis. Mild atherosclerosis at the origin of the arch branch vessels without significant stenosis. Mediastinum/Nodes: Mildly enlarged, shotty left para carinal lymph node measuring 12 mm short axis, 16 mm on the exam dated 02/04/2018. Right subcarinal node measuring 13 mm in short axis, previously 13 mm. No new prominent or enlarged lymph nodes. No mediastinal masses. No neck base or axillary masses or enlarged lymph nodes. Soft tissue thickening along the right hilar structures is unchanged from the most recent prior study. No discrete hilar masses or enlarged lymph nodes. Lungs/Pleura: There are new areas of bilateral airspace disease. Ground-glass opacities and patchy peribronchovascular consolidation is noted in the left upper lobe. Ground-glass opacity with dependent consolidation is noted in the posterior left lower lobe extending to the lung base. There is left lower lobe peribronchovascular consolidation, most confluent at posterior lung base.  Bilateral peribronchial thickening is noted similar to the most recent prior study. Other findings seen previously including soft tissue thickening along the right upper lobe bronchus and areas of nodular opacity are stable from the most recent prior exam consistent with the patient's known carcinoma. There is stable interstitial thickening most evident in the lower lungs. Trace left pleural effusion.  No pneumothorax. Upper Abdomen: No acute abnormality. Musculoskeletal: Stable 13 mm colonic lesion in T4. No new bone lesions. No fracture or acute finding. Review of the MIP images confirms the above findings. IMPRESSION: 1. No evidence of a pulmonary embolism. 2. Bilateral areas of ground-glass and more confluent lung opacity consistent with multifocal infection or inflammation, new since the prior chest, abdomen and pelvis CT from 02/04/2018. 3. Findings of lung carcinoma described previously are without significant change. Aortic Atherosclerosis (ICD10-I70.0) and Emphysema (ICD10-J43.9). Electronically Signed   By: Lajean Manes M.D.   On: 03/24/2018 13:11    EKG:   Orders placed or performed during the hospital encounter of 03/24/18  . ED EKG 12-Lead  . 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  1.  Healthcare acquired pneumonia-also concern for pneumonitis due to his immunomodulator therapy for lung cancer. -Follow-up blood cultures.  MRSA PCR is negative. -Started on cefepime.  Discontinue vancomycin and Flagyl -Continue supplemental oxygen and wean as tolerated. -CT of the chest showing no pulmonary embolism, multifocal infection worse on the left side. -IV steroids added by oncology for drug-induced pneumonitis  2.  Anemia of  chronic disease-stable at this time.  3.  Hypertension-on metoprolol  4.  COPD-stable.  Continue inhalers.  5.  DVT prophylaxis-Lovenox  Encourage ambulation.  Patient is independent at baseline.   All the records are reviewed and case discussed with Care Management/Social Workerr. Management plans discussed with the patient, family and they are in agreement.  CODE STATUS: DNR  TOTAL TIME TAKING CARE OF THIS PATIENT: 38 minutes.   POSSIBLE D/C IN 2 DAYS, DEPENDING ON CLINICAL CONDITION.   Gladstone Lighter M.D on 03/25/2018 at 1:56 PM  Between 7am to 6pm - Pager - [phone]  After 6pm go to [email or web address] - password [email or web address] [facility]  Office  [phone]  CC: Primary care physician; Valerie Roys, DO

## 2018-03-26 LAB — BASIC METABOLIC PANEL
Anion gap: 7 (ref 5–15)
BUN: 20 mg/dL (ref 8–23)
CHLORIDE: 109 mmol/L (ref 98–111)
CO2: 20 mmol/L — AB (ref 22–32)
CREATININE: 0.68 mg/dL (ref 0.61–1.24)
Calcium: 7.8 mg/dL — ABNORMAL LOW (ref 8.9–10.3)
GFR calc Af Amer: >60 mL/min (ref >60)
GFR calc non Af Amer: >60 mL/min (ref >60)
GLUCOSE: 146 mg/dL — AB (ref 70–99)
Potassium: 4.3 mmol/L (ref 3.5–5.1)
Sodium: 136 mmol/L (ref 135–145)

## 2018-03-26 LAB — CBC
HEMATOCRIT: 30.1 % — AB (ref 39.0–52.0)
Hemoglobin: 10 g/dL — ABNORMAL LOW (ref 13.0–17.0)
MCH: 32.6 pg (ref 26.0–34.0)
MCHC: 33.2 g/dL (ref 30.0–36.0)
MCV: 98 fL (ref 80.0–100.0)
Platelets: 242 10*3/uL (ref 150–400)
RBC: 3.07 MIL/uL — ABNORMAL LOW (ref 4.22–5.81)
RDW: 13.8 % (ref 11.5–15.5)
WBC: 14.6 10*3/uL — ABNORMAL HIGH (ref 4.0–10.5)
nRBC: 0 % (ref 0.0–0.2)

## 2018-03-26 MED ORDER — LEVALBUTEROL HCL 1.25 MG/0.5ML IN NEBU
1.2500 mg | INHALATION_SOLUTION | Freq: Four times a day (QID) | RESPIRATORY_TRACT | Status: DC
  Administered 2018-03-26 – 2018-03-30 (×16): 1.25 mg via RESPIRATORY_TRACT
  Filled 2018-03-26 (×17): qty 0.5

## 2018-03-26 MED ORDER — METOPROLOL TARTRATE 25 MG PO TABS
25.0000 mg | ORAL_TABLET | Freq: Two times a day (BID) | ORAL | Status: DC
  Administered 2018-03-26 – 2018-03-27 (×3): 25 mg via ORAL
  Filled 2018-03-26 (×3): qty 1

## 2018-03-26 NOTE — Progress Notes (Signed)
[place] at [place] NAME: James [name]    MR#:  [id number]  DATE OF BIRTH:  [birth year]  SUBJECTIVE:  CHIEF COMPLAINT:   Chief Complaint  Patient presents with  . Chest Pain   -Doing better today than yesterday, has some cough but loosening up.  Family is asking for physical therapy to make him sit in the chair.  REVIEW OF SYSTEMS:  Review of Systems  Constitutional: Positive for malaise/fatigue. Negative for chills and fever.  HENT: Negative for ear discharge, hearing loss and nosebleeds.   Respiratory: Positive for shortness of breath. Negative for cough and wheezing.   Cardiovascular: Negative for chest pain and palpitations.  Gastrointestinal: Negative for abdominal pain, constipation, diarrhea, nausea and vomiting.  Genitourinary: Negative for dysuria.  Musculoskeletal: Negative for myalgias.  Neurological: Negative for dizziness, focal weakness, seizures, weakness and headaches.  Psychiatric/Behavioral: Negative for depression.    DRUG ALLERGIES:  No Known Allergies  VITALS:  Blood pressure 134/83, pulse (!) 110, temperature (!) 97.4 F (36.3 C), temperature source Oral, resp. rate (!) 28, height 5\' 10"  (1.778 m), weight 64.4 kg, SpO2 94 %.  PHYSICAL EXAMINATION:  Physical Exam   GENERAL:  62 y.o.-year-old patient lying in the bed with no acute distress.  EYES: Pupils equal, round, reactive to light and accommodation. No scleral icterus. Extraocular muscles intact.  HEENT: Head atraumatic, normocephalic. Oropharynx and nasopharynx clear.  NECK:  Supple, no jugular venous distention. No thyroid enlargement, no tenderness.  LUNGS: Diminished breath sounds but no rales. CARDIOVASCULAR: S1, S2 normal. No  rubs, or gallops. 2/6 systolic murmur present ABDOMEN: Soft, nontender, nondistended. Bowel sounds present. No organomegaly or mass.  EXTREMITIES: No pedal edema, cyanosis, or clubbing.  NEUROLOGIC: Cranial nerves II through  XII are intact. Muscle strength 5/5 in all extremities. Sensation intact. Gait not checked.  PSYCHIATRIC: The patient is alert and oriented x 3.  SKIN: No obvious rash, lesion, or ulcer.    LABORATORY PANEL:   CBC Recent Labs  Lab 03/26/18 0415  WBC 14.6*  HGB 10.0*  HCT 30.1*  PLT 242   ------------------------------------------------------------------------------------------------------------------  Chemistries  Recent Labs  Lab 03/24/18 1046  03/26/18 0415  NA 131*   < > 136  K 4.3   < > 4.3  CL 99   < > 109  CO2 20*   < > 20*  GLUCOSE 109*   < > 146*  BUN 14   < > 20  CREATININE 0.82   < > 0.68  CALCIUM 8.4*   < > 7.8*  AST 42*  --   --   ALT 43  --   --   ALKPHOS 63  --   --   BILITOT 0.8  --   --    < > = values in this interval not displayed.   ------------------------------------------------------------------------------------------------------------------  Cardiac Enzymes Recent Labs  Lab 03/24/18 2206  TROPONINI 0.13*   ------------------------------------------------------------------------------------------------------------------  RADIOLOGY:  No results found.  EKG:   Orders placed or performed during the hospital encounter of 03/24/18  . ED EKG 12-Lead  . ED EKG 12-Lead  . 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  1.  Healthcare acquired pneumonia-also concern for pneumonitis due  to his immunomodulator therapy for lung cancer. -Follow-up blood cultures.  MRSA PCR is negative. -Started on cefepime.  Discontinue vancomycin and Flagyl -Continue supplemental oxygen and wean as tolerated. -CT of the chest showing no pulmonary embolism, multifocal infection worse on the left side. -Breathing  improved IV steroids started by oncology.  Continue IV steroids at this dose, out of bed to chair as tolerated.  2.  Anemia of chronic disease-stable at this time.  3.  Hypertension-on metoprolol, increase the dose because of tachycardia.  4.  COPD-stable.  Continue inhalers.,  Changed bronchodilators to Xopenex due to tachycardia.  5.  DVT prophylaxis-Lovenox #6 .  Metastatic adenocarcinoma of the lung, metastatic brain: Received radiation  tobrain. #7 tachycardic because of respiratory status.  #8 anxiety, received Ativan, Xanax.  And he says it helps him. Encourage ambulation.  Patient is independent at baseline.  Answered all the questions by patient's wife, Wean off oxygen as tolerated, out of bed to chair today.   All the records are reviewed and case discussed with Care Management/Social Workerr. Management plans discussed with the patient, family and they are in agreement.  CODE STATUS: DNR  TOTAL TIME TAKING CARE OF THIS PATIENT: 38 minutes.   POSSIBLE D/C IN 2 DAYS, DEPENDING ON CLINICAL CONDITION.   Epifanio Lesches M.D on 03/26/2018 at 1:47 PM  Between 7am to 6pm - Pager - [phone]  After 6pm go to [email or web address] - password [email or web address] [facility]  Office  [phone]  CC: Primary care physician; Valerie Roys, DO

## 2018-03-27 ENCOUNTER — Inpatient Hospital Stay: Payer: BC Managed Care – PPO

## 2018-03-27 MED ORDER — RISPERIDONE 1 MG PO TABS
2.5000 mg | ORAL_TABLET | Freq: Once | ORAL | Status: AC
  Administered 2018-03-27: 2.5 mg via ORAL
  Filled 2018-03-27: qty 1

## 2018-03-27 MED ORDER — LORAZEPAM 2 MG/ML IJ SOLN
1.0000 mg | Freq: Once | INTRAMUSCULAR | Status: AC
  Administered 2018-03-27: 03:00:00 1 mg via INTRAVENOUS
  Filled 2018-03-27: qty 1

## 2018-03-27 MED ORDER — LORAZEPAM 2 MG/ML IJ SOLN
INTRAMUSCULAR | Status: AC
  Filled 2018-03-27: qty 1

## 2018-03-27 MED ORDER — BOOST / RESOURCE BREEZE PO LIQD CUSTOM
1.0000 | Freq: Three times a day (TID) | ORAL | Status: DC
  Administered 2018-03-28 – 2018-04-04 (×11): 1 via ORAL

## 2018-03-27 MED ORDER — MIDODRINE HCL 5 MG PO TABS
5.0000 mg | ORAL_TABLET | Freq: Three times a day (TID) | ORAL | Status: DC
  Filled 2018-03-27: qty 1

## 2018-03-27 MED ORDER — METHYLPREDNISOLONE SODIUM SUCC 125 MG IJ SOLR: 125.0000 mg | Freq: Four times a day (QID) | INTRAMUSCULAR | Status: DC

## 2018-03-27 MED ORDER — METHYLPREDNISOLONE SODIUM SUCC 125 MG IJ SOLR
60.0000 mg | Freq: Four times a day (QID) | INTRAMUSCULAR | Status: DC
  Administered 2018-03-27 – 2018-03-28 (×4): 60 mg via INTRAVENOUS
  Filled 2018-03-27 (×4): qty 2

## 2018-03-27 NOTE — Plan of Care (Signed)
  Problem: Activity: Goal: Risk for activity intolerance will decrease Outcome: Progressing   Problem: Pain Managment: Goal: General experience of comfort will improve Outcome: Progressing   Problem: Safety: Goal: Ability to remain free from injury will improve Outcome: Progressing   

## 2018-03-27 NOTE — Progress Notes (Signed)
Pnt woke up several times overnight unable to tolerate CPAP. Pnt pulls off oxygen and becomes very anxious as he then has no O2 on. Pnt is maintaining saturation on 4L at this time.  Increased confusion and anxiousness overnight;Pnt reports having horrific nightmares. When going over meds given, pnt said Lorrin Mais is new for him. It is possible the Lorrin Mais is the problem but not completely sure because pnt received xanax, ativan x's2, and ambien. IV ativan given this am which pnt seemed to tolerate fine.   Will report to day nurse to communicate to team.

## 2018-03-27 NOTE — Evaluation (Signed)
Physical Therapy Evaluation Patient Details Name: James [name] MRN: [id number] DOB: 07/14/1955 Today's Date: 03/27/2018   History of Present Illness  James [name] is a 62yo male who comes to [facility] after worsening weakness, CP, SOB, fever; pt admitted with CAP. 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 Until recently pt's mobility has been fairly good overall, continuing to work soem adn access the community without AD.  Clinical Impression  Pt admitted with above diagnosis. Pt currently with functional limitations due to the deficits listed below (see "PT Problem List"). Upon entry, pt in bed, wife present. The pt is awake and agreeable to participate, although he appears physical exhausted, flat affect, and with audible work of breathing. The pt is alert and oriented x3, pleasant, conversational, and following simple commands consistently. Pt requires minA to EOB and min-modA for STS transfer and stand pivot transfer. HR increases to 126 BPM once to chair, SpO2 90% on 4L/min. Functional mobility assessment demonstrates increased effort/time requirements, poor tolerance, and need for physical assistance, whereas the patient performed these at a higher level of independence PTA. Pt will benefit from skilled PT intervention to increase independence and safety with basic mobility in preparation for discharge to the venue listed below.       Follow Up Recommendations Home health PT;Supervision for mobility/OOB    Equipment Recommendations  None recommended by PT    Recommendations for Other Services       Precautions / Restrictions Precautions Precautions: Fall Precaution Comments: watch HR       Mobility  Bed Mobility Overal bed mobility: Needs Assistance Bed Mobility: Supine to Sit     Supine to sit: Supervision     General bed mobility comments: heavy effort and additional time required  Transfers Overall transfer  level: Needs assistance Equipment used: 1 person hand held assist;None Transfers: Sit to/from Stand;Stand Pivot Transfers Sit to Stand: Min assist Stand pivot transfers: Min assist       General transfer comment: heavy effort and multiple attempts required, standing in a posterior lean with bilat hands held by PT   Ambulation/Gait   Gait Distance (Feet): (deferred d/t leg weakness and HR )            Stairs            Wheelchair Mobility    Modified Rankin (Stroke Patients Only)       Balance Overall balance assessment: Needs assistance       Postural control: Posterior lean Standing balance support: Bilateral upper extremity supported Standing balance-Leahy Scale: Poor                               Pertinent Vitals/Pain Pain Assessment: No/denies pain    Home Living Family/patient expects to be discharged to:: Private residence Living Arrangements: Spouse/significant other Available Help at Discharge: Family Type of Home: House Home Access: Stairs to enter Entrance Stairs-Rails: Right;Left Entrance Stairs-Number of Steps: 3 Home Layout: One level Home Equipment: Walker - 2 wheels;Bedside commode Additional Comments: using BSC as a shower seat currently.    Prior Function Level of Independence: Independent         Comments: works as a contractor      Hand Dominance        Extremity/Trunk Assessment        Lower Extremity Assessment Lower Extremity Assessment: Generalized weakness         Communication   Communication: No difficulties  Cognition Arousal/Alertness: Awake/alert(pt appears physically exhausted. ) Behavior During Therapy: WFL for tasks assessed/performed Overall Cognitive Status: Within Functional Limits for tasks assessed                                        General Comments      Exercises     Assessment/Plan    PT Assessment Patient needs continued PT services  PT Problem List  Cardiopulmonary status limiting activity;Decreased mobility;Decreased balance;Decreased activity tolerance;Decreased strength       PT Treatment Interventions Gait training;Functional mobility training;Stair training;Therapeutic activities;Therapeutic exercise;Wheelchair mobility training;Patient/family education;DME instruction;Balance training    PT Goals (Current goals can be found in the Care Plan section)  Acute Rehab PT Goals Patient Stated Goal: regain strength and become active again!  PT Goal Formulation: With patient Time For Goal Achievement: 04/10/18 Potential to Achieve Goals: Good    Frequency Min 2X/week   Barriers to discharge        Co-evaluation               AM-PAC PT "6 Clicks" Daily Activity  Outcome Measure Difficulty turning over in bed (including adjusting bedclothes, sheets and blankets)?: A Lot Difficulty moving from lying on back to sitting on the side of the bed? : A Lot Difficulty sitting down on and standing up from a chair with arms (e.g., wheelchair, bedside commode, etc,.)?: Unable Help needed moving to and from a bed to chair (including a wheelchair)?: A Little Help needed walking in hospital room?: A Lot Help needed climbing 3-5 steps with a railing? : Total 6 Click Score: 11    End of Session Equipment Utilized During Treatment: Oxygen Activity Tolerance: Patient tolerated treatment well;Patient limited by fatigue;Treatment limited secondary to medical complications (Comment)(HR) Patient left: in chair;with family/visitor present;with call bell/phone within reach Nurse Communication: Mobility status PT Visit Diagnosis: Unsteadiness on feet (R26.81);Muscle weakness (generalized) (M62.81);Difficulty in walking, not elsewhere classified (R26.2)    Time: 0911-0935 PT Time Calculation (min) (ACUTE ONLY): 24 min   Charges:   PT Evaluation $PT Eval Moderate Complexity: 1 Mod         1:19 PM, 03/27/18  C , PT,  DPT Physical Therapist - [place] [facility]  [phone] (ASCOM)    , C 03/27/2018, 1:17 PM   

## 2018-03-27 NOTE — Progress Notes (Signed)
[facility] [place] NAME: James [name]    MR#:  [id number]  DATE OF BIRTH:  August 17, 1955  SUBJECTIVE:  CHIEF COMPLAINT:   Chief Complaint  Patient presents with  . Chest Pain   Patient had a rough night last night, anxious, did not want CPAP because of the mask size which is big.  And received another Xanax to this morning, still on oxygen 4 L.  Slightly confused this morning.  On 4 L O2 sats 92%. REVIEW OF SYSTEMS:  Review of Systems  Constitutional: Positive for malaise/fatigue. Negative for chills and fever.  HENT: Negative for ear discharge, hearing loss and nosebleeds.   Respiratory: Positive for shortness of breath. Negative for cough and wheezing.   Cardiovascular: Negative for chest pain and palpitations.  Gastrointestinal: Negative for abdominal pain, constipation, diarrhea, nausea and vomiting.  Genitourinary: Negative for dysuria.  Musculoskeletal: Negative for myalgias.  Neurological: Negative for dizziness, focal weakness, seizures, weakness and headaches.  Psychiatric/Behavioral: Negative for depression.    DRUG ALLERGIES:  No Known Allergies  VITALS:  Blood pressure (!) 140/99, pulse (!) 111, temperature (!) 96.4 F (35.8 C), temperature source Axillary, resp. rate (!) 30, height 5\' 10"  (1.778 m), weight 60.4 kg, SpO2 92 %.  PHYSICAL EXAMINATION:  Physical Exam   GENERAL:  62 y.o.-year-old patient lying in the bed with no acute distress.  EYES: Pupils equal, round, reactive to light and accommodation. No scleral icterus. Extraocular muscles intact.  HEENT: Head atraumatic, normocephalic. Oropharynx and nasopharynx clear.  NECK:  Supple, no jugular venous distention. No thyroid enlargement, no tenderness.  LUNGS: Diminished breath sounds but no rales. CARDIOVASCULAR: S1, S2 normal. No  rubs, or gallops. 2/6 systolic murmur present ABDOMEN: Soft, nontender, nondistended. Bowel sounds present. No organomegaly or mass.    EXTREMITIES: No pedal edema, cyanosis, or clubbing.  NEUROLOGIC: Cranial nerves II through XII are intact. Muscle strength 5/5 in all extremities. Sensation intact. Gait not checked.  PSYCHIATRIC: The patient is alert and oriented x 3.  SKIN: No obvious rash, lesion, or ulcer.    LABORATORY PANEL:   CBC Recent Labs  Lab 03/26/18 0415  WBC 14.6*  HGB 10.0*  HCT 30.1*  PLT 242   ------------------------------------------------------------------------------------------------------------------  Chemistries  Recent Labs  Lab 03/24/18 1046  03/26/18 0415  NA 131*   < > 136  K 4.3   < > 4.3  CL 99   < > 109  CO2 20*   < > 20*  GLUCOSE 109*   < > 146*  BUN 14   < > 20  CREATININE 0.82   < > 0.68  CALCIUM 8.4*   < > 7.8*  AST 42*  --   --   ALT 43  --   --   ALKPHOS 63  --   --   BILITOT 0.8  --   --    < > = values in this interval not displayed.   ------------------------------------------------------------------------------------------------------------------  Cardiac Enzymes Recent Labs  Lab 03/24/18 2206  TROPONINI 0.13*   ------------------------------------------------------------------------------------------------------------------  RADIOLOGY:  Dg Chest Port 1 View  Result Date: 03/27/2018 CLINICAL DATA:  Patient with cough. EXAM: PORTABLE CHEST 1 VIEW COMPARISON:  Chest radiograph 03/24/2018 FINDINGS: Stable cardiac and mediastinal contours. Similar-appearing left lung coarse interstitial opacities. Unchanged heterogeneous opacities right lung base. No pleural effusion or pneumothorax. IMPRESSION: 1. Similar-appearing left lung coarse interstitial opacities which may represent infection. Followup PA and lateral chest X-ray is recommended  in 3-4 weeks following trial of antibiotic therapy to ensure resolution and exclude underlying malignancy. 2. Unchanged heterogeneous opacities right lung base which may represent atelectasis or infection. Electronically Signed    By: Lovey Newcomer M.D.   On: 03/27/2018 10:23    EKG:   Orders placed or performed during the hospital encounter of 03/24/18  . ED EKG 12-Lead  . ED EKG 12-Lead  . 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  1.  Healthcare acquired pneumonia-also concern for pneumonitis due to his immunomodulator therapy for lung cancer. -Blood cultures are negative, MRSA PCR is negative. -Started on cefepime.  Discontinue vancomycin and Flagyl -Continue supplemental oxygen and wean as tolerated. -CT of the chest showing no pulmonary embolism, multifocal infection worse on the left side.  -Patient still requiring oxygen at 4 L, unable to wean, continue bronchodilators with Xopenex, IV antibiotics, IV steroids, chest x-ray repeated today morning showed left lung opacities, unchanged heterogenous right lung base atelectasis.  Patient is on IV antibiotics, IV steroids.  Explained to patient's wife.  2.  Anemia of chronic disease-stable at this time.  3.  Hypertension-on metoprolol, increased the dose because of tachycardia.  4.  COPD-stable.  Continue inhalers.,  Changed bronchodilators to Xopenex due to tachycardia.  5.  DVT prophylaxis-Lovenox #6 .  Metastatic adenocarcinoma of the lung, metastatic brain: Received radiation  tobrain. #7 tachycardic because of respiratory status.  Continue Xopenex nebs.  #8 anxiety, received Ativan, Xanax.  And he says it helps him. Encourage ambulation.  Patient is independent at baseline.  Now requiring a lot of help, wife is requesting rehab placement before he comes home.  Will consult physical therapy.  Answered all the questions by patient's wife, .   All the records are reviewed and case discussed with Care  Management/Social Workerr. Management plans discussed with the patient, family and they are in agreement.  CODE STATUS: DNR  TOTAL TIME TAKING CARE OF THIS PATIENT: 38 minutes.   POSSIBLE D/C IN 2 DAYS, DEPENDING ON CLINICAL CONDITION.   Epifanio Lesches M.D on 03/27/2018 at 12:15 PM  Between 7am to 6pm - Pager - [phone]  After 6pm go to [email or web address] - password [email or web address]  Office  [phone]  CC: Primary care physician; Valerie Roys, DO

## 2018-03-27 NOTE — Progress Notes (Signed)
SATURATION QUALIFICATIONS: (This note is used to comply with regulatory documentation for home oxygen)  Patient Saturations on Room Air at Rest = 90%  Patient Saturations on Room Air while Ambulating = 85%  Patient Saturations on 4 Liters of oxygen while Ambulating = 92%  Please briefly explain why patient needs home oxygen:

## 2018-03-28 ENCOUNTER — Ambulatory Visit: Admission: RE | Admit: 2018-03-28 | Payer: BC Managed Care – PPO | Source: Ambulatory Visit

## 2018-03-28 DIAGNOSIS — D899 Disorder involving the immune mechanism, unspecified: Secondary | ICD-10-CM

## 2018-03-28 DIAGNOSIS — J9601 Acute respiratory failure with hypoxia: Secondary | ICD-10-CM

## 2018-03-28 DIAGNOSIS — R918 Other nonspecific abnormal finding of lung field: Secondary | ICD-10-CM

## 2018-03-28 DIAGNOSIS — C3490 Malignant neoplasm of unspecified part of unspecified bronchus or lung: Secondary | ICD-10-CM

## 2018-03-28 DIAGNOSIS — J189 Pneumonia, unspecified organism: Principal | ICD-10-CM

## 2018-03-28 DIAGNOSIS — R410 Disorientation, unspecified: Secondary | ICD-10-CM

## 2018-03-28 LAB — PROCALCITONIN: Procalcitonin: <0.1 ng/mL

## 2018-03-28 LAB — CREATININE, SERUM
CREATININE: 0.64 mg/dL (ref 0.61–1.24)
GFR calc non Af Amer: >60 mL/min (ref >60)

## 2018-03-28 LAB — SEDIMENTATION RATE: SED RATE: 106 mm/h — AB (ref 0–20)

## 2018-03-28 LAB — LEGIONELLA PNEUMOPHILA SEROGP 1 UR AG: L. PNEUMOPHILA SEROGP 1 UR AG: NEGATIVE

## 2018-03-28 LAB — BRAIN NATRIURETIC PEPTIDE: B NATRIURETIC PEPTIDE 5: 107 pg/mL — AB (ref 0.0–100.0)

## 2018-03-28 LAB — LACTATE DEHYDROGENASE: LDH: 179 U/L (ref 98–192)

## 2018-03-28 MED ORDER — METHYLPREDNISOLONE SODIUM SUCC 125 MG IJ SOLR: 60.0000 mg | Freq: Three times a day (TID) | INTRAMUSCULAR | Status: DC

## 2018-03-28 MED ORDER — MORPHINE SULFATE (PF) 2 MG/ML IV SOLN
1.0000 mg | INTRAVENOUS | Status: DC | PRN
  Administered 2018-03-28: 2 mg via INTRAVENOUS
  Administered 2018-03-28: 1 mg via INTRAVENOUS
  Administered 2018-03-28: 20:00:00 2 mg via INTRAVENOUS
  Administered 2018-03-28: 01:00:00 1 mg via INTRAVENOUS
  Administered 2018-03-28 – 2018-03-30 (×6): 2 mg via INTRAVENOUS
  Filled 2018-03-28 (×10): qty 1

## 2018-03-28 MED ORDER — METHYLPREDNISOLONE SODIUM SUCC 40 MG IJ SOLR
40.0000 mg | Freq: Three times a day (TID) | INTRAMUSCULAR | Status: DC
  Administered 2018-03-28 – 2018-03-30 (×6): 40 mg via INTRAVENOUS
  Filled 2018-03-28 (×6): qty 1

## 2018-03-28 MED ORDER — METOPROLOL TARTRATE 50 MG PO TABS
50.0000 mg | ORAL_TABLET | Freq: Two times a day (BID) | ORAL | Status: DC
  Administered 2018-03-28 – 2018-04-04 (×14): 50 mg via ORAL
  Filled 2018-03-28 (×14): qty 1

## 2018-03-28 MED ORDER — DILTIAZEM HCL 30 MG PO TABS
30.0000 mg | ORAL_TABLET | Freq: Three times a day (TID) | ORAL | Status: DC
  Administered 2018-03-28 – 2018-04-04 (×21): 30 mg via ORAL
  Filled 2018-03-28 (×21): qty 1

## 2018-03-28 NOTE — Progress Notes (Signed)
James [name]   DOB:July 13, 1955   RJ#:[id number]    Subjective: Patient has not had significant improvement of his respiratory status over the last few days.  He continues to be needing 4 L of oxygen. [None at baseline/at home.].  Has had increasing episodes of delirium/confusion hallucination as per wife.  Patient received IV morphine yesterday/again this morning for air hunger/anxiety.  Seems to help him.  Objective:  Vitals:   03/28/18 0731 03/28/18 0915  BP:  (!) 144/90  Pulse:  (!) 128  Resp:  (!) 22  Temp:  (!) 97.5 F (36.4 C)  SpO2: 94% 96%     Intake/Output Summary (Last 24 hours) at 03/28/2018 1258 Last data filed at 03/28/2018 1009 Gross per 24 hour  Intake 1801.78 ml  Output 750 ml  Net 1051.78 ml    GENERAL: Thin cachectic male patient resting in the bed.  No acute distress.  Currently on 4 LN.  EYES: no pallor or icterus OROPHARYNX: no thrush or ulceration. NECK: supple, no masses felt LYMPH:  no palpable lymphadenopathy in the cervical, axillary or inguinal regions LUNGS: decreased breath sounds to auscultation R>L.   No wheeze or crackles HEART/CVS: regular rate & rhythm and no murmurs; No lower extremity edema ABDOMEN: abdomen soft, non-tender and normal bowel sounds Musculoskeletal:no cyanosis of digits and no clubbing  PSYCH: alert & oriented x 3 with fluent speech NEURO: no focal motor/sensory deficits SKIN:  no rashes or significant lesions   Labs:  Lab Results  Component Value Date   WBC 14.6 (H) 03/26/2018   HGB 10.0 (L) 03/26/2018   HCT 30.1 (L) 03/26/2018   MCV 98.0 03/26/2018   PLT 242 03/26/2018   NEUTROABS 6.3 03/24/2018    Lab Results  Component Value Date   NA 136 03/26/2018   K 4.3 03/26/2018   CL 109 03/26/2018   CO2 20 (L) 03/26/2018    Studies:  Dg Chest Port 1 View  Result Date: 03/27/2018 CLINICAL DATA:  Patient with cough. EXAM: PORTABLE CHEST 1 VIEW COMPARISON:  Chest radiograph 03/24/2018 FINDINGS: Stable cardiac and  mediastinal contours. Similar-appearing left lung coarse interstitial opacities. Unchanged heterogeneous opacities right lung base. No pleural effusion or pneumothorax. IMPRESSION: 1. Similar-appearing left lung coarse interstitial opacities which may represent infection. Followup PA and lateral chest X-ray is recommended in 3-4 weeks following trial of antibiotic therapy to ensure resolution and exclude underlying malignancy. 2. Unchanged heterogeneous opacities right lung base which may represent atelectasis or infection. Electronically Signed   By: Lovey Newcomer M.D.   On: 03/27/2018 10:23    Assessment & Plan:   #62 year old male patient history of metastatic adenocarcinoma the lung currently status post 4 cycles of chemo immunotherapy is admitted the hospital for worsening shortness of breath  # Metastatic adenocarcinoma the lung-currently status post 4 cycles of carbotaxol-atezo+bev-CT scan shows stable disease the lung; question lymphangitic spread based on CT scan [clinically less likely]-see discussion below  #Acute respiratory failure needing oxygen-bilateral groundglass infiltrates left more than right-suspicious of inflammatory pneumonitis secondary to immunotherapy/possible infectious pneumonia [less likely lymphangitic spread].  Some improvement noted but not significant.  Recommend decreasing the dose of steroids [given delirium/see below] to 40 mg every 8 [from 60 every 6].  Recommend pulmonary evaluation.   #Agitation/anxiety secondary to delirium-second steroids/polypharmacy.  Decrease the dose of steroids.  #Brain metastasis solitary status post SBRT.    Stable  #DVT/GI prophylaxis  #The above plan of care was discussed with the patient/wife and Dr. Vianne Bulls.  Lenetta Quaker  Ann Lions, MD 03/28/2018  12:58 PM

## 2018-03-28 NOTE — Consult Note (Addendum)
PULMONARY HOSPITAL CONSULT NOTE  Requesting MD/Service: Hospitalists Date of initial consultation: 03/28/18 Reason for consultation: Acute hypoxemic respiratory failure, pneumonitis on CT chest  PT PROFILE: 62 y.o. male former smoker with moderate to severe COPD diagnosed with metastatic adenocarcinoma of lung origin in July of this year.  Just completed his fourth cycle of chemotherapy/immunotherapy 3 weeks prior to admission  DATA: 02/03/18 PFTs: Moderate-severe obstruction 03/24/18 CT chest: Bilateral areas of ground-glass and more confluent lung opacity consistent with multifocal infection or inflammation, new since the prior chest, abdomen and pelvis CT from 02/04/2018   INTERVAL:  HPI:  As above.  He was admitted 03/24/2018 with 4 to 5 days of low-grade fevers and progressive respiratory distress with hypoxemia.  Chest x-ray was abnormal and CT of the chest is as documented above.  Initial concern was possible bacterial infection for which he was treated with vancomycin and cefepime.  Vancomycin has subsequently been discontinued and he remains on cefepime.  He has also been treated with systemic corticosteroids for the possibility of chemotherapy-induced pneumonitis.  At this time, he remains on 5 LPM by [place] and remains dyspneic at rest.  He does not believe that he is any better than when he first came in.  His hospitalization has been complicated by steroid-induced agitation and delirium.  He also notes increasing ankle edema.  Past Medical History:  Diagnosis Date  . Cancer ([place]) 12/01/2017   Primary cancer of right upper lobe of lung. Mets to liver, brain.  Marland Kitchen Prepatellar bursitis of left knee    patient denies    Past Surgical History:  Procedure Laterality Date  . collapsed lung  2012   "had to reinflate" " i carried large piece of sheet rock up stairs by myself "   . [place]   x2 , second was with mesh   . TOTAL HIP ARTHROPLASTY Right 04/09/2017    Procedure: RIGHT TOTAL HIP ARTHROPLASTY ANTERIOR APPROACH;  Surgeon: Mcarthur Rossetti, MD;  Location: [facility];  Service: Orthopedics;  Laterality: Right;  . URETHRAL STRICTURE DILATATION      MEDICATIONS: I have reviewed all medications and confirmed regimen as documented  Social History   Socioeconomic History  . Marital status: Married    Spouse name: Not on file  . Number of children: Not on file  . Years of education: Not on file  . Highest education level: Not on file  Occupational History  . Not on file  Social Needs  . Financial resource strain: Not on file  . Food insecurity:    Worry: Not on file    Inability: Not on file  . Transportation needs:    Medical: Not on file    Non-medical: Not on file  Tobacco Use  . Smoking status: Former Smoker    Types: Cigarettes    Last attempt to quit: 03/18/2012    Years since quitting: 6.0  . Smokeless tobacco: Never Used  . Tobacco comment: 5 years   Substance and Sexual Activity  . Alcohol use: Yes    Comment: Socially  . Drug use: Yes    Types: Marijuana    Comment: 1 month ago   . Sexual activity: Not Currently  Lifestyle  . Physical activity:    Days per week: Not on file    Minutes per session: Not on file  . Stress: Not on file  Relationships  . Social connections:    Talks on phone: Not on file  Gets together: Not on file    Attends religious service: Not on file    Active member of club or organization: Not on file    Attends meetings of clubs or organizations: Not on file    Relationship status: Not on file  . Intimate partner violence:    Fear of current or ex partner: Not on file    Emotionally abused: Not on file    Physically abused: Not on file    Forced sexual activity: Not on file  Other Topics Concern  . Not on file  Social History Narrative   Father and mother with hypertension.     No family history on file.  ROS: No myalgias/arthralgias, unexplained weight loss or weight  gain No new focal weakness or sensory deficits No otalgia, hearing loss, visual changes, nasal and sinus symptoms, mouth and throat problems No neck pain or adenopathy No abdominal pain, N/V/D, diarrhea, change in bowel pattern No dysuria, change in urinary pattern   Vitals:   03/28/18 0627 03/28/18 0731 03/28/18 0915 03/28/18 1347  BP: (!) 136/92  (!) 144/90   Pulse: (!) 116  (!) 128   Resp: 18  (!) 22   Temp: (!) 97.3 F (36.3 C)  (!) 97.5 F (36.4 C)   TempSrc:   Oral   SpO2: 94% 94% 96% 93%  Weight:      Height:         EXAM:  Gen: WDWN, moderately labored appearing but speaks in full sentences HEENT: Alopecia, NCAT, sclera white, oropharynx normal Neck: Supple without LAN, thyromegaly, JVD Lungs: Bibasilar crackles L>R, bronchial breath sounds in LLL Cardiovascular: RRR, no murmurs noted Abdomen: Soft, nontender, normal BS Ext: without clubbing, cyanosis.  Trace symmetric LE edema Neuro: CNs grossly intact, motor and sensory intact Skin: Limited exam, no lesions noted  DATA:   BMP Latest Ref Rng & Units 03/28/2018 03/26/2018 03/25/2018  Glucose 70 - 99 mg/dL - 146(H) 169(H)  BUN 8 - 23 mg/dL - 20 13  Creatinine 0.61 - 1.24 mg/dL 0.64 0.68 0.75  BUN/Creat Ratio 10 - 24 - - -  Sodium 135 - 145 mmol/L - 136 136  Potassium 3.5 - 5.1 mmol/L - 4.3 4.3  Chloride 98 - 111 mmol/L - 109 106  CO2 22 - 32 mmol/L - 20(L) 21(L)  Calcium 8.9 - 10.3 mg/dL - 7.8(L) 7.6(L)    CBC Latest Ref Rng & Units 03/26/2018 03/25/2018 03/24/2018  WBC 4.0 - 10.5 K/uL 14.6(H) 7.2 8.1  Hemoglobin 13.0 - 17.0 g/dL 10.0(L) 10.4(L) 10.9(L)  Hematocrit 39.0 - 52.0 % 30.1(L) 31.5(L) 31.8(L)  Platelets 150 - 400 K/uL 242 182 220    CXR 11/10: [place] ill-defined opacity throughout on left and in R base  I have personally reviewed all chest radiographs reported above including CXRs and CT chest unless otherwise indicated  IMPRESSION:   Metastatic adenocarcinoma of lung origin Recent completion of  fourth cycle of chemotherapy/immunotherapy Acute hypoxemic respiratory failure L >R pulmonary opacities Nonspecific pneumonitis/pneumonia pattern on CXR   I have spoken with Dr. Rogue Bussing who believes that this is most likely chemotherapy-induced pneumonitis.  Alternatively, radiographic findings could include bacterial pneumonia or atypical infection.  He is probably somewhat immunocompromised and therefore we should consider possibility of opportunistic infections.  Unfortunately, because of his tenuous respiratory status, a bronchoscopy would present substantial risk to him of worsening respiratory failure requiring intubation and ventilatory support.  If it is felt that performing this procedure would significantly improve our  chances of successfully treating him, it can be done.   PLAN:  Continue cefepime Continue systemic steroids at current dose Continue supplemental oxygen I have asked RT to see if he is symptomatically benefited by HFNC Infectious disease consultation requested Studies sent: ESR, BNP, PCT, LDH, Fungitell, quantiferon gold  Merton Border, MD PCCM service Mobile [phone] Pager [phone] 03/28/2018 4:04 PM

## 2018-03-28 NOTE — Progress Notes (Addendum)
[facility] [place] NAME: James [name]    MR#:  [id number]  DATE OF BIRTH:  Jul 13, 1955  SUBJECTIVE: Became very agitated yesterday with hallucinations yesterday evening.  Discontinued Xanax, received morphine to help with shortness of breath .morning he appears very alert, awake and denies any anxiety.  Still on oxygen 4 L, tachycardic with heart rate up to 120 bpm  CHIEF COMPLAINT:   Chief Complaint  Patient presents with  . Chest Pain   Shortness of breath not much improved since yesterday. REVIEW OF SYSTEMS:  Review of Systems  Constitutional: Positive for malaise/fatigue. Negative for chills and fever.  HENT: Negative for ear discharge, hearing loss and nosebleeds.   Respiratory: Positive for shortness of breath. Negative for cough and wheezing.   Cardiovascular: Negative for chest pain and palpitations.  Gastrointestinal: Negative for abdominal pain, constipation, diarrhea, nausea and vomiting.  Genitourinary: Negative for dysuria.  Musculoskeletal: Negative for myalgias.  Neurological: Negative for dizziness, focal weakness, seizures, weakness and headaches.  Psychiatric/Behavioral: Negative for depression.    DRUG ALLERGIES:  No Known Allergies  VITALS:  Blood pressure (!) 144/90, pulse (!) 128, temperature (!) 97.5 F (36.4 C), temperature source Oral, resp. rate (!) 22, height 5\' 10"  (1.778 m), weight 66 kg, SpO2 96 %.  PHYSICAL EXAMINATION:  Physical Exam   GENERAL:  62 y.o.-year-old patient lying in the bed with no acute distress.  EYES: Pupils equal, round, reactive to light and accommodation. No scleral icterus. Extraocular muscles intact.  HEENT: Head atraumatic, normocephalic. Oropharynx and nasopharynx clear.  NECK:  Supple, no jugular venous distention. No thyroid enlargement, no tenderness.  LUNGS: Diminished air entry bilaterally but more so on the right side.Marland Kitchen CARDIOVASCULAR: S1, S2 tachycardic. No  rubs, or  gallops. 2/6 systolic murmur present ABDOMEN: Soft, nontender, nondistended. Bowel sounds present. No organomegaly or mass.  EXTREMITIES: No pedal edema, cyanosis, or clubbing.  NEUROLOGIC: Cranial nerves II through XII are intact. Muscle strength 5/5 in all extremities. Sensation intact. Gait not checked.  PSYCHIATRIC: The patient is alert and oriented x 3.  SKIN: No obvious rash, lesion, or ulcer.    LABORATORY PANEL:   CBC Recent Labs  Lab 03/26/18 0415  WBC 14.6*  HGB 10.0*  HCT 30.1*  PLT 242   ------------------------------------------------------------------------------------------------------------------  Chemistries  Recent Labs  Lab 03/24/18 1046  03/26/18 0415 03/28/18 0947  NA 131*   < > 136  --   K 4.3   < > 4.3  --   CL 99   < > 109  --   CO2 20*   < > 20*  --   GLUCOSE 109*   < > 146*  --   BUN 14   < > 20  --   CREATININE 0.82   < > 0.68 0.64  CALCIUM 8.4*   < > 7.8*  --   AST 42*  --   --   --   ALT 43  --   --   --   ALKPHOS 63  --   --   --   BILITOT 0.8  --   --   --    < > = values in this interval not displayed.   ------------------------------------------------------------------------------------------------------------------  Cardiac Enzymes Recent Labs  Lab 03/24/18 2206  TROPONINI 0.13*   ------------------------------------------------------------------------------------------------------------------  RADIOLOGY:  Dg Chest Port 1 View  Result Date: 03/27/2018 CLINICAL DATA:  Patient with cough. EXAM: PORTABLE CHEST 1 VIEW COMPARISON:  Chest radiograph 03/24/2018 FINDINGS: Stable cardiac and mediastinal contours. Similar-appearing left lung coarse interstitial opacities. Unchanged heterogeneous opacities right lung base. No pleural effusion or pneumothorax. IMPRESSION: 1. Similar-appearing left lung coarse interstitial opacities which may represent infection. Followup PA and lateral chest X-ray is recommended in 3-4 weeks following trial  of antibiotic therapy to ensure resolution and exclude underlying malignancy. 2. Unchanged heterogeneous opacities right lung base which may represent atelectasis or infection. Electronically Signed   By: Lovey Newcomer M.D.   On: 03/27/2018 10:23    EKG:   Orders placed or performed during the hospital encounter of 03/24/18  . ED EKG 12-Lead  . ED EKG 12-Lead  . 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  1.  Healthcare acquired pneumonia-also concern for pneumonitis due to his immunomodulator therapy for lung cancer. -Blood cultures are negative, MRSA PCR is negative. -Started on cefepime.  Discontinue vancomycin and Flagyl -Continue supplemental oxygen and wean as tolerated. -CT of the chest showing no pulmonary embolism, multifocal infection worse on the left side.  -Patient still requiring oxygen at 4 L, unable to wean, continue bronchodilators with Xopenex, IV antibiotics, IV steroids, chest x-ray repeated yesterday morning showed left lung opacities, unchanged heterogenous right lung base atelectasis.   Discussed with Dr. Rogue Bussing today, recommends pulmonary consult.  Continue bronchodilators with Xopenex, decrease the dose of IV steroids thinking that he may be having the steroid psychosis.  2.  Anemia of chronic disease-stable at this time.  3.  Hypertension-on metoprolol, sinus tachycardia secondary to respiratory status: Added small dose of Cardizem as well.   4.  COPD-stable.  Continue inhalers.,  Changed bronchodilators to Xopenex due to tachycardia. No exacerbation at this time.   5.  DVT prophylaxis-Lovenox   #6 .  Metastatic adenocarcinoma of the lung, metastatic brain: Received radiation  To brain. .  #7.Marland Kitchenanxiety,  possible steroid psychosis: Continue Ativan as needed reported discontinue Xanax, decrease dose of steroids.  We will see how he does today. Answered all the questions by patient's wife,  .  Discussed with Dr. Rogue Bussing.  More than 50% time spent in counseling, coordination of care All the records are reviewed and case discussed with Care Management/Social Workerr. Management plans discussed with the patient, family and they are in agreement.  CODE STATUS: DNR  TOTAL TIME TAKING CARE OF THIS PATIENT: 38 minutes.   POSSIBLE D/C IN 2 DAYS, DEPENDING ON CLINICAL CONDITION.   Epifanio Lesches M.D on 03/28/2018 at 11:37 AM  Between 7am to 6pm - Pager - [phone]  After 6pm go to [email or web address] - password [email or web address] [facility]  Office  [phone]  CC: Primary care physician; Valerie Roys, DO

## 2018-03-28 NOTE — Consult Note (Signed)
Pharmacy Antibiotic Note  James [name] is a 62 y.o. male admitted on 03/24/2018 with pneumonia.  Pharmacy has been consulted for Vancomycin/Cefepime dosing. Vancomycin discontinued 11/8.  Plan: Continue Cefepime 2g IV every 8 hours. Per primary MD plan for 7 day total course.  Will follow pt renal function at a minimum of every 3 days  Height: 5\' 10"  (177.8 cm) Weight: 145 lb 8.1 oz (66 kg) IBW/kg (Calculated) : 73  Temp (24hrs), Avg:97.5 F (36.4 C), Min:97.3 F (36.3 C), Max:98 F (36.7 C)  Recent Labs  Lab 03/24/18 1045 03/24/18 1046 03/24/18 1502 03/25/18 0517 03/26/18 0415 03/28/18 0947  WBC  --  8.1  --  7.2 14.6*  --   CREATININE  --  0.82  --  0.75 0.68 0.64  LATICACIDVEN 1.5  --  1.1  --   --   --     Estimated Creatinine Clearance: 87.1 mL/min (by C-G formula based on SCr of 0.64 mg/dL).    No Known Allergies  Antimicrobials this admission: Flagyl 11/7>>11/8 Vancomycin 11/7 >>  11/8 Cefepime 11/7 >>   Dose adjustments this admission: N/A  Microbiology results: 11/07 BCx: NG TD 11/07 Sputum: pending  11/07 MRSA PCR: negative   Thank you for allowing pharmacy to be a part of this patient's care. Forrest Moron, PharmD Clinical Pharmacist 03/28/2018 1:47 PM

## 2018-03-28 NOTE — Progress Notes (Signed)
Patient declined CPAP at this time

## 2018-03-29 DIAGNOSIS — Z96641 Presence of right artificial hip joint: Secondary | ICD-10-CM

## 2018-03-29 DIAGNOSIS — Z8701 Personal history of pneumonia (recurrent): Secondary | ICD-10-CM

## 2018-03-29 DIAGNOSIS — R0603 Acute respiratory distress: Secondary | ICD-10-CM

## 2018-03-29 DIAGNOSIS — C7951 Secondary malignant neoplasm of bone: Secondary | ICD-10-CM

## 2018-03-29 LAB — CULTURE, BLOOD (ROUTINE X 2)
Culture: NO GROWTH
Culture: NO GROWTH
SPECIAL REQUESTS: ADEQUATE
SPECIAL REQUESTS: ADEQUATE

## 2018-03-29 LAB — CBC
HEMATOCRIT: 30 % — AB (ref 39.0–52.0)
HEMOGLOBIN: 9.6 g/dL — AB (ref 13.0–17.0)
MCH: 32.7 pg (ref 26.0–34.0)
MCHC: 32 g/dL (ref 30.0–36.0)
MCV: 102 fL — AB (ref 80.0–100.0)
Platelets: 214 10*3/uL (ref 150–400)
RBC: 2.94 MIL/uL — AB (ref 4.22–5.81)
RDW: 14.3 % (ref 11.5–15.5)
WBC: 8.2 10*3/uL (ref 4.0–10.5)
nRBC: 0 % (ref 0.0–0.2)

## 2018-03-29 MED ORDER — SULFAMETHOXAZOLE-TRIMETHOPRIM 400-80 MG/5ML IV SOLN
320.0000 mg | Freq: Three times a day (TID) | INTRAVENOUS | Status: DC
  Administered 2018-03-29 – 2018-03-30 (×3): 320 mg via INTRAVENOUS
  Filled 2018-03-29 (×6): qty 20

## 2018-03-29 MED ORDER — ALPRAZOLAM 0.25 MG PO TABS
0.2500 mg | ORAL_TABLET | Freq: Three times a day (TID) | ORAL | Status: DC | PRN
  Administered 2018-03-29 – 2018-03-30 (×3): 0.25 mg via ORAL
  Filled 2018-03-29 (×3): qty 1

## 2018-03-29 MED ORDER — ADULT MULTIVITAMIN W/MINERALS CH
1.0000 | ORAL_TABLET | Freq: Every day | ORAL | Status: DC
  Administered 2018-03-30 – 2018-04-04 (×6): 1 via ORAL
  Filled 2018-03-29 (×5): qty 1

## 2018-03-29 MED ORDER — NEPRO/CARBSTEADY PO LIQD
237.0000 mL | Freq: Two times a day (BID) | ORAL | Status: DC
  Administered 2018-04-02: 09:00:00 237 mL via ORAL

## 2018-03-29 NOTE — Progress Notes (Signed)
Patient stating Ativan at bedtime resulted in confusion. Wife stated this has happened at home also. May need different medication ordered. Madlyn Frankel, RN

## 2018-03-29 NOTE — Progress Notes (Signed)
PT Cancellation Note  Patient Details Name: James [name] MRN: [id number] DOB: Feb 15, 1956   Cancelled Treatment:    Reason Eval/Treat Not Completed: Patient declined session secondary to weakness and "still working on trying to get my breath".  Pt educated on physiological benefits of activity with pt again declining to participate this date.  Will attempt to see pt at a future date/time as medically appropriate.     Linus Salmons PT, DPT 03/29/18, 1:17 PM

## 2018-03-29 NOTE — Consult Note (Signed)
NAMEJodeci [name]  DOB: [birth year]  MRN: [id number]  Date/Time: 03/29/2018 6:54 PM  simmonds Subjective:  REASON FOR CONSULT: infiltrate lung ? James [name] is a 62 y.o. 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 He says after completion of 2 cycles of chemo he started feeling SOB. On 10/18 he was hospitalized with fever, sob, cough and was diagnosed as pneumonia and treated with antibiotics nebs, and was discharged on 10/21 on oral augmentin. He saw his oncologist as OP on 10/28. He saw NP on 11/7 with fever, sob, cough and was sent to ED. He had a low grade fever of 100.4 in the ED. O2 stas were low.CT chest showed ground glass opacity left upper lobe worse than rt side. He was started on vanco, cefepime and flagyl. After discusision with his oncologist Dr. Yevette Edwards concern for drug induced pneumonitis was raised and he was started on solumedrol. As he continued to be sob he ws seen by pulmonary who wanted to r/o opportunistic infections and asked me to see the patient. 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, no pets, lived in [place] but not in the [place]  but exposure to coccidioides possible On talking to the Oncologist he had a prolonged course of PO steroids for the brain met in July /August   Past Medical History:  Diagnosis Date  . Cancer ([place]) 12/01/2017   Primary cancer of right upper lobe of lung. Mets to liver, brain.  Marland Kitchen Prepatellar bursitis of left knee    patient denies     Past Surgical History:  Procedure Laterality Date  . collapsed lung  2012   "had to reinflate" " i carried large piece of sheet rock up stairs by myself "   . [place]   x2 , second was with mesh   . TOTAL HIP ARTHROPLASTY Right 04/09/2017   Procedure: RIGHT TOTAL HIP ARTHROPLASTY ANTERIOR APPROACH;  Surgeon: Mcarthur Rossetti, MD;  Location: [facility];  Service: Orthopedics;  Laterality: Right;  . URETHRAL STRICTURE DILATATION      SH Ex smoker Lives with his wife    No family history on file. No Known Allergies ? Current Facility-Administered Medications  Medication Dose Route Frequency Provider Last Rate Last Dose  . 0.9 %  sodium chloride infusion   Intravenous PRN Epifanio Lesches, MD   Stopped at 03/27/18 1656  . acetaminophen (TYLENOL) tablet 650 mg  650 mg Oral Q6H PRN Hillary Bow, MD   650 mg at 03/26/18 1618   Or  . acetaminophen (TYLENOL) suppository 650 mg  650 mg Rectal Q6H PRN Sudini, Alveta Heimlich, MD      . ALPRAZolam Duanne Moron) tablet 0.25 mg  0.25 mg Oral TID PRN Epifanio Lesches, MD   0.25 mg at 03/29/18 1839  . benzonatate (TESSALON) capsule 200 mg  200 mg Oral TID PRN Hillary Bow, MD   200 mg at 03/28/18 2150  . ceFEPIme (  MAXIPIME) 2 g in sodium chloride 0.9 % 100 mL IVPB  2 g Intravenous Q8H Lu Duffel, RPH 200 mL/hr at 03/29/18 1511 2 g at 03/29/18 1511  . diltiazem (CARDIZEM) tablet 30 mg  30 mg Oral Q8H Epifanio Lesches, MD   30 mg at 03/29/18 1504  . enoxaparin (LOVENOX) injection 40 mg  40 mg Subcutaneous Q2200 Hillary Bow, MD   40 mg at 03/28/18 2151  . feeding supplement (BOOST / RESOURCE BREEZE) liquid 1 Container  1 Container Oral TID BM Epifanio Lesches, MD   1 Container at 03/29/18 0929  . feeding supplement (NEPRO CARB STEADY) liquid 237 mL  237 mL Oral BID BM Epifanio Lesches, MD      . guaiFENesin-dextromethorphan (ROBITUSSIN DM) 100-10 MG/5ML syrup 5 mL  5 mL Oral Q4H PRN Sudini, Srikar, MD       . levalbuterol Penne Lash) nebulizer solution 1.25 mg  1.25 mg Nebulization Q6H Epifanio Lesches, MD   1.25 mg at 03/29/18 1354  . methylPREDNISolone sodium succinate (SOLU-MEDROL) 40 mg/mL injection 40 mg  40 mg Intravenous Q8H Charlaine Dalton R, MD   40 mg at 03/29/18 1505  . metoprolol tartrate (LOPRESSOR) injection 5 mg  5 mg Intravenous Q4H PRN Dustin Flock, MD   5 mg at 03/29/18 0019  . metoprolol tartrate (LOPRESSOR) tablet 50 mg  50 mg Oral BID Epifanio Lesches, MD   50 mg at 03/29/18 0928  . mometasone-formoterol (DULERA) 100-5 MCG/ACT inhaler 2 puff  2 puff Inhalation BID Hillary Bow, MD   2 puff at 03/29/18 0929  . morphine 2 MG/ML injection 1-2 mg  1-2 mg Intravenous Q4H PRN Amelia Jo, MD   2 mg at 03/29/18 1704  . multivitamin with minerals tablet 1 tablet  1 tablet Oral Daily Epifanio Lesches, MD      . ondansetron (ZOFRAN) tablet 4 mg  4 mg Oral Q6H PRN Hillary Bow, MD       Or  . ondansetron (ZOFRAN) injection 4 mg  4 mg Intravenous Q6H PRN Hillary Bow, MD   4 mg at 03/24/18 1736  . pantoprazole (PROTONIX) EC tablet 20 mg  20 mg Oral BID Cammie Sickle, MD   20 mg at 03/29/18 0928  . polyethylene glycol (MIRALAX / GLYCOLAX) packet 17 g  17 g Oral Daily PRN Sudini, Alveta Heimlich, MD      . sodium chloride flush (NS) 0.9 % injection 3 mL  3 mL Intravenous Q12H Sudini, Srikar, MD   3 mL at 03/29/18 0930     Abtx:  Anti-infectives (From admission, onward)   Start     Dose/Rate Route Frequency Ordered Stop   03/24/18 2000  ceFEPIme (MAXIPIME) 2 g in sodium chloride 0.9 % 100 mL IVPB     2 g 200 mL/hr over 30 Minutes Intravenous Every 8 hours 03/24/18 1318     03/24/18 1800  vancomycin (VANCOCIN) IVPB 1000 mg/200 mL premix  Status:  Discontinued     1,000 mg 200 mL/hr over 60 Minutes Intravenous Every 12 hours 03/24/18 1318 03/25/18 1152   03/24/18 1330  metroNIDAZOLE (FLAGYL) tablet 500 mg  Status:  Discontinued     500 mg Oral Every 8 hours  03/24/18 1219 03/25/18 1152   03/24/18 1045  ceFEPIme (MAXIPIME) 1 g in sodium chloride 0.9 % 100 mL IVPB     1 g 200 mL/hr over 30 Minutes Intravenous  Once 03/24/18 1033 03/24/18 1155   03/24/18 1045  vancomycin (VANCOCIN) IVPB 1000 mg/200 mL  premix     1,000 mg 200 mL/hr over 60 Minutes Intravenous STAT 03/24/18 1041 03/24/18 1532      REVIEW OF SYSTEMS:  Const: fever, negative chills,weight loss Eyes: negative diplopia or visual changes, negative eye pain ENT: negative coryza, negative sore throat Resp:  cough, no hemoptysis, ++dyspnea Cards: negative for chest pain, palpitations, lower extremity edema GU: negative for frequency, dysuria and hematuria GI: Negative for abdominal pain , diarrhea, bleeding, constipation Skin: negative for rash and pruritus Heme: negative for easy bruising and gum/nose bleeding MS: muscle weakness, fatigue Neurolo:negative for headaches, dizziness, vertigo, memory problems  Psych: negative for feelings of anxiety, depression  Endocrine: no polyuria or polydipsia Allergy/Immunology- negative for any medication or food allergies  Objective:  VITALS:  BP (!) 148/93   Pulse 93   Temp (!) 97.5 F (36.4 C) (Oral)   Resp 20   Ht _0  (1.778 m)   Wt 63.9 kg   SpO2 96%   BMI 20.22 kg/m  PHYSICAL EXAM:  General: Alert, cooperative, has resp distress, thin Head: Normocephalic, without obvious abnormality, atraumatic. Eyes: Conjunctivae clear, anicteric sclerae. Pupils are equal ENT Nares normal. No drainage or sinus tenderness. Lips, mucosa, and tongue normal. No Thrush Neck: Supple, symmetrical, no adenopathy, thyroid: non tender no carotid bruit and no JVD. Back: No CVA tenderness. Lungs: b/l air entry- crepts both sides Left side of the chest prominent than the rt in the front Heart:s1s2 tachycardia Abdomen: Soft, non-tender,not distended. Bowel sounds normal. No masses Extremities: atraumatic, no cyanosis. No edema. No clubbing Skin:  sun related changes- solar lentigenes Lymph: Cervical, supraclavicular normal. Neurologic: Grossly non-focal Pertinent Labs Lab Results CBC    Component Value Date/Time   WBC 8.2 03/29/2018 0357   RBC 2.94 (L) 03/29/2018 0357   HGB 9.6 (L) 03/29/2018 0357   HGB 13.3 08/24/2017 1651   HCT 30.0 (L) 03/29/2018 0357   HCT 40.5 08/24/2017 1651   PLT 214 03/29/2018 0357   PLT 268 08/24/2017 1651   MCV 102.0 (H) 03/29/2018 0357   MCV 99 (H) 08/24/2017 1651   MCV 96 04/23/2013 1330   MCH 32.7 03/29/2018 0357   MCHC 32.0 03/29/2018 0357   RDW 14.3 03/29/2018 0357   RDW 13.7 08/24/2017 1651   RDW 12.6 04/23/2013 1330   LYMPHSABS 0.6 (L) 03/24/2018 1046   LYMPHSABS 1.5 08/24/2017 1651   MONOABS 1.0 03/24/2018 1046   EOSABS 0.1 03/24/2018 1046   EOSABS 0.3 08/24/2017 1651   BASOSABS 0.0 03/24/2018 1046   BASOSABS 0.0 08/24/2017 1651    CMP Latest Ref Rng & Units 03/28/2018 03/26/2018 03/25/2018  Glucose 70 - 99 mg/dL - 146(H) 169(H)  BUN 8 - 23 mg/dL - 20 13  Creatinine 0.61 - 1.24 mg/dL 0.64 0.68 0.75  Sodium 135 - 145 mmol/L - 136 136  Potassium 3.5 - 5.1 mmol/L - 4.3 4.3  Chloride 98 - 111 mmol/L - 109 106  CO2 22 - 32 mmol/L - 20(L) 21(L)  Calcium 8.9 - 10.3 mg/dL - 7.8(L) 7.6(L)  Total Protein 6.5 - 8.1 g/dL - - -  Total Bilirubin 0.3 - 1.2 mg/dL - - -  Alkaline Phos 38 - 126 U/L - - -  AST 15 - 41 U/L - - -  ALT 0 - 44 U/L - - -      Microbiology: Recent Results (from the past 240 hour(s))  Blood Culture (routine x 2)     Status: None   Collection Time: 03/24/18 10:46 AM  Result Value Ref Range Status   Specimen Description BLOOD RIGHT ARM  Final   Special Requests   Final    BOTTLES DRAWN AEROBIC AND ANAEROBIC Blood Culture adequate volume   Culture   Final    NO GROWTH 5 DAYS Performed at [facility], [address] [place], [place] [address]    Report Status 03/29/2018 FINAL  Final  Blood Culture (routine x 2)     Status: None   Collection Time:  03/24/18 10:46 AM  Result Value Ref Range Status   Specimen Description BLOOD RIGHT WRIST  Final   Special Requests   Final    BOTTLES DRAWN AEROBIC AND ANAEROBIC Blood Culture adequate volume   Culture   Final    NO GROWTH 5 DAYS Performed at [facility], [address] [place], [place] [address]    Report Status 03/29/2018 FINAL  Final  Culture, sputum-assessment     Status: None   Collection Time: 03/24/18  4:15 PM  Result Value Ref Range Status   Specimen Description SPUTUM  Final   Special Requests Immunocompromised  Final   Sputum evaluation   Final    Sputum specimen not acceptable for testing.  Please recollect.   C/WANETTE MILES AT 1648 03/25/18.PMH Performed at [facility], [address] [place], [place] [address]    Report Status 03/25/2018 FINAL  Final  MRSA PCR Screening     Status: None   Collection Time: 03/24/18  4:42 PM  Result Value Ref Range Status   MRSA by PCR NEGATIVE NEGATIVE Final    Comment:        The GeneXpert MRSA Assay (FDA approved for NASAL specimens only), is one component of a comprehensive MRSA colonization surveillance program. It is not intended to diagnose MRSA infection nor to guide or monitor treatment for MRSA infections. Performed at [facility], [address] [place], [place] [address]    IMAGING RESULTS: ?  02/04/18   Impression/Recommendation ?62 y.o. 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 He says after completion of 2 cycles of chemo he started feeling SOB. ? ?Recurrent  hospitalization for fever and sob and CT chest shows worsening Ground glass opacity of the left upper lobe predominantly and scattered  This GGO has multiple DD. This is unlikely to be typical bacterial pneumonia as he ahs no responded to antibiotics and also procal< 0.10   Could it be PJP? Especially that he was on steroids in the past-  Unilateral findings  Less common  but possible- Beta D glucan has been sent- will start him empirically on bactrim until then- if Beta D glucan neg then PJ?CP unlikely Could it be fungal pneumonia- never had prolonged  neutropenia for aspergillus. But will sent fungal antibodies ( cocci, histop, blasto, crypto) but GGO is not a common picture in the above  He is getting monoclonal antibodies Bevacizumab (VEGFI), Atezolizumab. The latter can cause many immune mediated patholgy including pneumonitis which is highly possible especially as he started getting symptoms after 2 cycles.  Ideally  will need bronch/BAL or lung biopsy to prove etiology but his breathing is very tenuous and he will have to be intubated .  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

## 2018-03-29 NOTE — Progress Notes (Signed)
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  GOAL:   Patient will meet greater than or equal to 90% of their needs  -progressing  MONITOR:   PO intake, Supplement acceptance, Weight trends, TF tolerance, I & O's, Skin  ASSESSMENT:   62 year old male w/ stage IV lung cancer and brain, liver mets who presented to ED for fever and SOB x 2days and admitted w/ upper/lower lobe pneumonia. Last chemo completed 10/17 and recent [facility] discharge 10/21 for pneumonia. PMH:  Hernia repair x2, total hip arthroplasty.   Met with pt in room today. Pt reports his appetite and oral intake is improving; pt eating 50% of meals. Pt is drinking Boost Breeze and Ensure clear brought from home. Pt does not like regular Ensure. Pt would like to try butter pecan Nepro as he reports he likes this flavor. RD will add Magic Cups to trays. Wife also bringing food from outside the hospital. Per chart, pt is weight stable. Pt planning to work with PT tomorrow. Recommend continue supplements and MVI.   Medications reviewed and include: lovenox, solu-medrol, protonix  Labs reviewed: Hgb 9.6(L), Hct 30.0(L)  Diet Order:   Diet Order            Diet regular Room service appropriate? Yes; Fluid consistency: Thin  Diet effective now             EDUCATION NEEDS:   Education needs have been addressed  Skin:  Skin Assessment: Reviewed RN Assessment(ecchymosis bilateral arms)  Last BM:  11/12- type 4  Height:    Ht Readings from Last 1 Encounters:  03/24/18 _0  (1.778 m)    Weight:   Wt Readings from Last 1 Encounters:  03/29/18 63.9 kg   03/14/18 61.7 kg  03/05/18 66.2 kg  03/02/18 63.5 kg  02/08/18 65.3 kg  02/07/18 64.9 kg  01/18/18 66.7 kg    Ideal Body Weight:  75 kg  BMI:  Body mass index is 20.22 kg/m.  Estimated Nutritional Needs:   Kcal:  1900-2200kcal/day   Protein:  96-108g/day   Fluid:  >1.9L/day   Koleen Distance MS, RD, LDN Pager #- [phone] Office#- [phone] After Hours Pager: [phone]

## 2018-03-30 ENCOUNTER — Inpatient Hospital Stay: Payer: BC Managed Care – PPO

## 2018-03-30 ENCOUNTER — Inpatient Hospital Stay: Payer: BC Managed Care – PPO | Admitting: Internal Medicine

## 2018-03-30 DIAGNOSIS — Z515 Encounter for palliative care: Secondary | ICD-10-CM

## 2018-03-30 DIAGNOSIS — J189 Pneumonia, unspecified organism: Secondary | ICD-10-CM

## 2018-03-30 DIAGNOSIS — T50905A Adverse effect of unspecified drugs, medicaments and biological substances, initial encounter: Secondary | ICD-10-CM

## 2018-03-30 LAB — FUNGITELL, SERUM

## 2018-03-30 LAB — BASIC METABOLIC PANEL
ANION GAP: 9 (ref 5–15)
BUN: 23 mg/dL (ref 8–23)
CHLORIDE: 106 mmol/L (ref 98–111)
CO2: 22 mmol/L (ref 22–32)
Calcium: 7.8 mg/dL — ABNORMAL LOW (ref 8.9–10.3)
Creatinine, Ser: 0.62 mg/dL (ref 0.61–1.24)
GFR calc non Af Amer: >60 mL/min (ref >60)
Glucose, Bld: 129 mg/dL — ABNORMAL HIGH (ref 70–99)
POTASSIUM: 4.5 mmol/L (ref 3.5–5.1)
SODIUM: 137 mmol/L (ref 135–145)

## 2018-03-30 LAB — BLOOD GAS, ARTERIAL
Acid-base deficit: 0.4 mmol/L (ref 0.0–2.0)
Bicarbonate: 22.6 mmol/L (ref 20.0–28.0)
FIO2: 0.4
O2 Saturation: 98.8 %
PH ART: 7.47 — AB (ref 7.350–7.450)
Patient temperature: 37
pCO2 arterial: 31 mmHg — ABNORMAL LOW (ref 32.0–48.0)
pO2, Arterial: 117 mmHg — ABNORMAL HIGH (ref 83.0–108.0)

## 2018-03-30 MED ORDER — SULFAMETHOXAZOLE-TRIMETHOPRIM 800-160 MG PO TABS
1.0000 | ORAL_TABLET | ORAL | Status: DC
  Administered 2018-03-31 – 2018-04-04 (×5): 1 via ORAL
  Filled 2018-03-30 (×5): qty 1

## 2018-03-30 MED ORDER — MORPHINE SULFATE (PF) 2 MG/ML IV SOLN
1.0000 mg | INTRAVENOUS | Status: DC | PRN
  Administered 2018-03-30 – 2018-04-03 (×9): 2 mg via INTRAVENOUS
  Filled 2018-03-30 (×10): qty 1

## 2018-03-30 MED ORDER — RISPERIDONE 0.25 MG PO TABS
0.2500 mg | ORAL_TABLET | Freq: Once | ORAL | Status: AC
  Administered 2018-03-30: 0.25 mg via ORAL
  Filled 2018-03-30: qty 1

## 2018-03-30 MED ORDER — LEVALBUTEROL HCL 1.25 MG/0.5ML IN NEBU: 0.6300 mg | INHALATION_SOLUTION | Freq: Four times a day (QID) | RESPIRATORY_TRACT | Status: DC | PRN

## 2018-03-30 MED ORDER — LORAZEPAM 2 MG/ML IJ SOLN: 0.5000 mg | Freq: Once | INTRAMUSCULAR | Status: DC

## 2018-03-30 MED ORDER — SODIUM CHLORIDE 0.9 % IV SOLN
5.0000 mg/kg | Freq: Once | INTRAVENOUS | Status: AC
  Administered 2018-03-30: 300 mg via INTRAVENOUS
  Filled 2018-03-30: qty 30

## 2018-03-30 MED ORDER — METHYLPREDNISOLONE SODIUM SUCC 40 MG IJ SOLR
40.0000 mg | Freq: Three times a day (TID) | INTRAMUSCULAR | Status: AC
  Administered 2018-03-30: 40 mg via INTRAVENOUS
  Filled 2018-03-30: qty 1

## 2018-03-30 MED ORDER — SODIUM CHLORIDE 0.9 % IV BOLUS
500.0000 mL | Freq: Once | INTRAVENOUS | Status: AC
  Administered 2018-03-30: 11:00:00 500 mL via INTRAVENOUS

## 2018-03-30 MED ORDER — METHYLPREDNISOLONE SODIUM SUCC 125 MG IJ SOLR
80.0000 mg | Freq: Every day | INTRAMUSCULAR | Status: DC
  Administered 2018-03-31: 80 mg via INTRAVENOUS
  Filled 2018-03-30: qty 2

## 2018-03-30 MED ORDER — RISPERIDONE 1 MG PO TBDP
0.5000 mg | ORAL_TABLET | Freq: Every day | ORAL | Status: DC
  Administered 2018-03-30 – 2018-04-03 (×5): 0.5 mg via ORAL
  Filled 2018-03-30 (×6): qty 0.5

## 2018-03-30 NOTE — Progress Notes (Signed)
PT Cancellation Note  Patient Details Name: James [name] MRN: [id number] DOB: Sep 23, 1955   Cancelled Treatment:    Reason Eval/Treat Not Completed: Medical issues which prohibited therapy   Pt in bed with c/o SOB (98%), seeing spots and bugs, not being able to focus, chest pain and general malaise "Terrible."  Pt stated RN was aware and MD arrived during discussion with pt.  Will hold session this am and continue as appropriate.   Chesley Noon 03/30/2018, 10:39 AM

## 2018-03-30 NOTE — Progress Notes (Signed)
James [name] is a 62 y.o. 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 He says after completion of 2 cycles of chemo he started feeling SOB. On 10/18 he was hospitalized with fever, sob, cough and was diagnosed as pneumonia and treated with antibiotics nebs, and was discharged on 10/21 on oral augmentin. He saw his oncologist as OP on 10/28. He saw NP on 11/7 with fever, sob, cough and was sent to ED. He had a low grade fever of 100.4 in the ED. O2 stas were low.CT chest showed ground glass opacity left upper lobe worse than rt side. He was started on vanco, cefepime and flagyl. After discusision with his oncologist Dr. Yevette Edwards concern for drug induced pneumonitis was raised and he was started on solumedrol. As he continued to be sob he ws seen by pulmonary who wanted to r/o opportunistic infections and asked me to see the patient. 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, no pets, lived in [place] but not in the [place]  but exposure to coccidioides possible On talking to the Oncologist he had a prolonged course of PO steroids for the brain met in July /August  subjective Says he had a rough day Was very sob Started on risperidone , lorazepam  Objective:  VITALS:  BP (!) 158/88 (BP Location: Left Arm)   Pulse (!) 102   Temp (!) 96.5 F (35.8 C) (Axillary)   Resp 20   Ht _0  (1.778 m)   Wt 64.6 kg   SpO2 97%   BMI 20.43 kg/m  PHYSICAL EXAM:  General: Alert, cooperative, has resp  distress, thin Head: Normocephalic, without obvious abnormality, atraumatic. Eyes: Conjunctivae clear, anicteric sclerae. Pupils are equal ENT Nares normal. No drainage or sinus tenderness. Lips, mucosa, and tongue normal. No Thrush Neck: Supple, symmetrical, no adenopathy, thyroid: non tender no carotid bruit and no JVD. Back: No CVA tenderness. Lungs: b/l air entry- crepts both sides Left side of the chest prominent than the rt in the front Heart:s1s2 tachycardia Abdomen: Soft, non-tender,not distended. Bowel sounds normal. No masses Extremities: atraumatic, no cyanosis. No edema. No clubbing Skin: sun related changes- solar lentigenes Lymph: Cervical, supraclavicular normal. Neurologic: Grossly non-focal Pertinent Labs Lab Results CBC    Component Value Date/Time   WBC 8.2 03/29/2018 0357   RBC 2.94 (L) 03/29/2018 0357   HGB 9.6 (L) 03/29/2018 0357   HGB 13.3 08/24/2017 1651   HCT 30.0 (L) 03/29/2018 0357   HCT 40.5 08/24/2017 1651   PLT 214 03/29/2018 0357   PLT 268 08/24/2017 1651   MCV 102.0 (H) 03/29/2018 0357   MCV 99 (H) 08/24/2017 1651   MCV 96 04/23/2013 1330   MCH 32.7 03/29/2018 0357   MCHC 32.0 03/29/2018 0357   RDW 14.3 03/29/2018 0357   RDW 13.7 08/24/2017 1651   RDW 12.6 04/23/2013 1330   LYMPHSABS 0.6 (L) 03/24/2018 1046   LYMPHSABS 1.5 08/24/2017 1651   MONOABS  1.0 03/24/2018 1046   EOSABS 0.1 03/24/2018 1046   EOSABS 0.3 08/24/2017 1651   BASOSABS 0.0 03/24/2018 1046   BASOSABS 0.0 08/24/2017 1651    CMP Latest Ref Rng & Units 03/30/2018 03/28/2018 03/26/2018  Glucose 70 - 99 mg/dL 129(H) - 146(H)  BUN 8 - 23 mg/dL 23 - 20  Creatinine 0.61 - 1.24 mg/dL 0.62 0.64 0.68  Sodium 135 - 145 mmol/L 137 - 136  Potassium 3.5 - 5.1 mmol/L 4.5 - 4.3  Chloride 98 - 111 mmol/L 106 - 109  CO2 22 - 32 mmol/L 22 - 20(L)  Calcium 8.9 - 10.3 mg/dL 7.8(L) - 7.8(L)  Total Protein 6.5 - 8.1 g/dL - - -  Total Bilirubin 0.3 - 1.2 mg/dL - - -  Alkaline Phos 38 -  126 U/L - - -  AST 15 - 41 U/L - - -  ALT 0 - 44 U/L - - -      Microbiology: Recent Results (from the past 240 hour(s))  Blood Culture (routine x 2)     Status: None   Collection Time: 03/24/18 10:46 AM  Result Value Ref Range Status   Specimen Description BLOOD RIGHT ARM  Final   Special Requests   Final    BOTTLES DRAWN AEROBIC AND ANAEROBIC Blood Culture adequate volume   Culture   Final    NO GROWTH 5 DAYS Performed at [facility], [address], [place], [place] [address]    Report Status 03/29/2018 FINAL  Final  Blood Culture (routine x 2)     Status: None   Collection Time: 03/24/18 10:46 AM  Result Value Ref Range Status   Specimen Description BLOOD RIGHT WRIST  Final   Special Requests   Final    BOTTLES DRAWN AEROBIC AND ANAEROBIC Blood Culture adequate volume   Culture   Final    NO GROWTH 5 DAYS Performed at [facility], [address] [place], [place] [address]    Report Status 03/29/2018 FINAL  Final  Culture, sputum-assessment     Status: None   Collection Time: 03/24/18  4:15 PM  Result Value Ref Range Status   Specimen Description SPUTUM  Final   Special Requests Immunocompromised  Final   Sputum evaluation   Final    Sputum specimen not acceptable for testing.  Please recollect.   C/WANETTE MILES AT 1648 03/25/18.PMH Performed at [facility], [address] [place], [place] [address]    Report Status 03/25/2018 FINAL  Final  MRSA PCR Screening     Status: None   Collection Time: 03/24/18  4:42 PM  Result Value Ref Range Status   MRSA by PCR NEGATIVE NEGATIVE Final    Comment:        The GeneXpert MRSA Assay (FDA approved for NASAL specimens only), is one component of a comprehensive MRSA colonization surveillance program. It is not intended to diagnose MRSA infection nor to guide or monitor treatment for MRSA infections. Performed at [facility], [address] [place],  [address]     IMAGING RESULTS: ?  02/04/18   Impression/Recommendation ?62 y.o. 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 He says after completion of 2 cycles of chemo he started feeling SOB. ? ?Recurrent hospitalization for fever and sob and CT chest shows worsening Ground glass opacity of the left upper lobe predominantly and scattered  This GGO has multiple DD. This is unlikely to be typical bacterial pneumonia as he has not responded to antibiotics and also procal< 0.10  Beta D glucan negative which rules out PJP and some fungal infections So will DC bactrim IV. As high dose steroids- will do prophylactic bactrim  Could it be fungal pneumonia- never had prolonged  neutropenia for aspergillus. sent fungal antibodies ( cocci, histop, blasto, crypto) but GGO is not a common picture in the above  He was getting monoclonal antibodies Bevacizumab (VEGFI), Atezolizumab. The latter can cause many immune mediated patholgy including pneumonitis which is highly possible especially as he started getting symptoms after 2 cycles.Discussed with oncologist. He has received one dose of influximab  Ideally will need bronch/BAL or lung biopsy to prove etiology but his breathing is very tenuous and he will have to be intubated .  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

## 2018-03-30 NOTE — Consult Note (Signed)
[place]  [phone] [phone]   Name: James [name] Date: 03/30/2018 MRN: [id number]  DOB: 1956-04-18  Patient Care Team: Valerie Roys, DO as PCP - General (Family Medicine) Telford Nab, RN as Registered Nurse Rogue Bussing, Elisha Headland, MD as Medical Oncologist (Medical Oncology)    REASON FOR CONSULTATION: Palliative Care consult requested for this 62 y.o. male with multiple medical problems including stage IV lung cancer last treated in October with chemotherapy/immunotherapy, history of tobacco abuse, and COPD, who was recently hospitalized 03/04/2018 to 03/07/2018 with shortness of breath, nausea and vomiting after receiving chemotherapy.  He was treated empirically for possible pneumonia.  Admitted on 03/24/2018 with fevers and hypoxic respiratory failure.  Chest CT revealed bilateral areas of groundglass appearance consistent with multifocal infection or inflammation.  Patient has been treated empirically for possible pneumonia versus pneumonitis from the immunotherapy.  Patient's hospitalization has been further complicated by persistent shortness of breath and delirium.  Palliative care was consulted to help manage symptoms and address goals.   SOCIAL HISTORY:    Patient is married to his wife of 4 years.  He lives at home.  He has no children.  He formerly worked in [employer].  ADVANCE DIRECTIVES:  Patient's wife is his decision maker  CODE STATUS: DNR  PAST MEDICAL HISTORY: Past Medical History:  Diagnosis Date  . Cancer ([place]) 12/01/2017   Primary cancer of right upper lobe of lung. Mets to liver, brain.  Marland Kitchen Prepatellar bursitis of left knee    patient denies    PAST SURGICAL HISTORY:  Past Surgical History:  Procedure Laterality Date  . collapsed lung  2012   "had to reinflate" " i carried large piece of sheet rock up stairs by myself "   . [place]   x2 , second was with  mesh   . TOTAL HIP ARTHROPLASTY Right 04/09/2017   Procedure: RIGHT TOTAL HIP ARTHROPLASTY ANTERIOR APPROACH;  Surgeon: Mcarthur Rossetti, MD;  Location: [facility];  Service: Orthopedics;  Laterality: Right;  . URETHRAL STRICTURE DILATATION      HEMATOLOGY/ONCOLOGY HISTORY:  Oncology History   # July 2019- [facility]- RUL [s/p Liver Bx]-CT chest- RUL/ liver/ brain solitary lesion.  to contralateral lung; pleura; liver and brain.July 2019- A/P- liver metastases at least 2; bone scan shows right humeral; left fifth rib; L2 vertebral body metastases.  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  --------------------------------------------------------------------   DIAGNOSIS: Adenocarcinoma lung  STAGE: 4       ;GOALS: Palliative  CURRENT/MOST RECENT THERAPY -CARBO+TAXOL+ATEZO+AVASTIN      Primary cancer of right upper lobe of lung ([place])   11/24/2017 -  Chemotherapy    The patient had bevacizumab (AVASTIN) 1,000 mg in sodium chloride 0.9 % 100 mL chemo infusion, 15 mg/kg = 1,000 mg, Intravenous,  Once, 4 of 4 cycles Administration: 1,000 mg (12/16/2017), 1,000 mg (01/19/2018), 1,000 mg (02/09/2018), 1,000 mg (03/03/2018)  for chemotherapy treatment.     12/09/2017 -  Chemotherapy    The patient had palonosetron (ALOXI) injection 0.25 mg, 0.25 mg, Intravenous,  Once, 4 of 4 cycles Administration: 0.25 mg (12/16/2017), 0.25 mg (01/19/2018), 0.25 mg (02/09/2018), 0.25 mg (03/03/2018) CARBOplatin (PARAPLATIN) 670 mg in sodium chloride 0.9 % 250 mL chemo infusion, 670 mg (100 % of original dose 667.8 mg), Intravenous,  Once, 4 of 4 cycles Dose modification:   (  original dose 667.8 mg, Cycle 1, Reason: Provider Judgment) Administration: 670 mg (12/16/2017), 610 mg (01/19/2018), 610 mg (02/09/2018) PACLitaxel (TAXOL) 360 mg in sodium chloride 0.9 % 500 mL chemo infusion (> 79m/m2), 200 mg/m2 = 360 mg,  Intravenous,  Once, 4 of 4 cycles Administration: 360 mg (12/16/2017), 360 mg (01/19/2018), 360 mg (02/09/2018), 360 mg (03/03/2018) atezolizumab (TECENTRIQ) 1,200 mg in sodium chloride 0.9 % 250 mL chemo infusion, 1,200 mg, Intravenous, Once, 4 of 4 cycles Administration: 1,200 mg (12/16/2017), 1,200 mg (01/19/2018), 1,200 mg (02/09/2018), 1,200 mg (03/03/2018)  for chemotherapy treatment.      ALLERGIES:  has No Known Allergies.  MEDICATIONS:  Current Facility-Administered Medications  Medication Dose Route Frequency Provider Last Rate Last Dose  . 0.9 %  sodium chloride infusion   Intravenous PRN KEpifanio Lesches MD 10 mL/hr at 03/30/18 0514    . acetaminophen (TYLENOL) tablet 650 mg  650 mg Oral Q6H PRN SHillary Bow MD   650 mg at 03/26/18 1618   Or  . acetaminophen (TYLENOL) suppository 650 mg  650 mg Rectal Q6H PRN Sudini, SAlveta Heimlich MD      . benzonatate (TESSALON) capsule 200 mg  200 mg Oral TID PRN SHillary Bow MD   200 mg at 03/28/18 2150  . diltiazem (CARDIZEM) tablet 30 mg  30 mg Oral Q8H KEpifanio Lesches MD   30 mg at 03/30/18 0509  . enoxaparin (LOVENOX) injection 40 mg  40 mg Subcutaneous Q2200 SHillary Bow MD   40 mg at 03/29/18 2132  . feeding supplement (BOOST / RESOURCE BREEZE) liquid 1 Container  1 Container Oral TID BM KEpifanio Lesches MD   1 Container at 03/29/18 0929  . feeding supplement (NEPRO CARB STEADY) liquid 237 mL  237 mL Oral BID BM KEpifanio Lesches MD      . guaiFENesin-dextromethorphan (ROBITUSSIN DM) 100-10 MG/5ML syrup 5 mL  5 mL Oral Q4H PRN Sudini, Srikar, MD      . inFLIXimab (REMICADE) 5 mg/kg = 300 mg in sodium chloride 0.9 % 250 mL infusion  5 mg/kg Intravenous Once BCharlaine DaltonR, MD 10 mL/hr at 03/30/18 1351 300 mg at 03/30/18 1351  . levalbuterol (XOPENEX) nebulizer solution 0.63 mg  0.63 mg Nebulization Q6H PRN SWilhelmina Mcardle MD      . [Derrill MemoON 03/31/2018] methylPREDNISolone sodium succinate (SOLU-MEDROL) 125 mg/2 mL  injection 80 mg  80 mg Intravenous Daily BCharlaine DaltonR, MD      . methylPREDNISolone sodium succinate (SOLU-MEDROL) 40 mg/mL injection 40 mg  40 mg Intravenous Q8H Slaughter, Myra G, RPH      . metoprolol tartrate (LOPRESSOR) injection 5 mg  5 mg Intravenous Q4H PRN PDustin Flock MD   5 mg at 03/29/18 0019  . metoprolol tartrate (LOPRESSOR) tablet 50 mg  50 mg Oral BID KEpifanio Lesches MD   50 mg at 03/29/18 2132  . mometasone-formoterol (DULERA) 100-5 MCG/ACT inhaler 2 puff  2 puff Inhalation BID SHillary Bow MD   2 puff at 03/30/18 0835  . morphine 2 MG/ML injection 1-2 mg  1-2 mg Intravenous Q2H PRN BCammie Sickle MD   2 mg at 03/30/18 1239  . multivitamin with minerals tablet 1 tablet  1 tablet Oral Daily KEpifanio Lesches MD   1 tablet at 03/30/18 0835  . ondansetron (ZOFRAN) tablet 4 mg  4 mg Oral Q6H PRN Sudini, SAlveta Heimlich MD       Or  . ondansetron (ZOFRAN) injection 4 mg  4 mg Intravenous Q6H PRN Sudini,  Srikar, MD   4 mg at 03/24/18 1736  . pantoprazole (PROTONIX) EC tablet 20 mg  20 mg Oral BID Cammie Sickle, MD   20 mg at 03/30/18 0835  . polyethylene glycol (MIRALAX / GLYCOLAX) packet 17 g  17 g Oral Daily PRN Sudini, Alveta Heimlich, MD      . risperiDONE (RISPERDAL M-TABS) disintegrating tablet 0.5 mg  0.5 mg Oral QHS Epifanio Lesches, MD      . sodium chloride flush (NS) 0.9 % injection 3 mL  3 mL Intravenous Q12H Hillary Bow, MD   3 mL at 03/29/18 2342  . sulfamethoxazole-trimethoprim (BACTRIM) 320 mg in dextrose 5 % 500 mL IVPB  320 mg Intravenous Q8H Ravishankar, Joellyn Quails, MD 346.7 mL/hr at 03/30/18 0550 320 mg at 03/30/18 0550    VITAL SIGNS: BP (!) 133/93 (BP Location: Right Arm)   Pulse (!) 105   Temp (!) 97.5 F (36.4 C) (Oral)   Resp 18   Ht _0  (1.778 m)   Wt 142 lb 6.7 oz (64.6 kg)   SpO2 94%   BMI 20.43 kg/m  Filed Weights   03/28/18 0500 03/29/18 0500 03/30/18 0419  Weight: 145 lb 8.1 oz (66 kg) 140 lb 14.4 oz (63.9 kg)  142 lb 6.7 oz (64.6 kg)    Estimated body mass index is 20.43 kg/m as calculated from the following:   Height as of this encounter: _1  (1.778 m).   Weight as of this encounter: 142 lb 6.7 oz (64.6 kg).  LABS: CBC:    Component Value Date/Time   WBC 8.2 03/29/2018 0357   HGB 9.6 (L) 03/29/2018 0357   HGB 13.3 08/24/2017 1651   HCT 30.0 (L) 03/29/2018 0357   HCT 40.5 08/24/2017 1651   PLT 214 03/29/2018 0357   PLT 268 08/24/2017 1651   MCV 102.0 (H) 03/29/2018 0357   MCV 99 (H) 08/24/2017 1651   MCV 96 04/23/2013 1330   NEUTROABS 6.3 03/24/2018 1046   NEUTROABS 3.8 08/24/2017 1651   LYMPHSABS 0.6 (L) 03/24/2018 1046   LYMPHSABS 1.5 08/24/2017 1651   MONOABS 1.0 03/24/2018 1046   EOSABS 0.1 03/24/2018 1046   EOSABS 0.3 08/24/2017 1651   BASOSABS 0.0 03/24/2018 1046   BASOSABS 0.0 08/24/2017 1651   Comprehensive Metabolic Panel:    Component Value Date/Time   NA 137 03/30/2018 1016   NA 138 08/24/2017 1651   NA 137 04/23/2013 1330   K 4.5 03/30/2018 1016   K 3.8 04/23/2013 1330   CL 106 03/30/2018 1016   CL 105 04/23/2013 1330   CO2 22 03/30/2018 1016   CO2 22 04/23/2013 1330   BUN 23 03/30/2018 1016   BUN 18 08/24/2017 1651   BUN 18 04/23/2013 1330   CREATININE 0.62 03/30/2018 1016   CREATININE 0.90 04/23/2013 1330   GLUCOSE 129 (H) 03/30/2018 1016   GLUCOSE 120 (H) 04/23/2013 1330   CALCIUM 7.8 (L) 03/30/2018 1016   CALCIUM 9.3 04/23/2013 1330   AST 42 (H) 03/24/2018 1046   AST 11 (L) 04/23/2013 1330   ALT 43 03/24/2018 1046   ALT 18 04/23/2013 1330   ALKPHOS 63 03/24/2018 1046   ALKPHOS 64 04/23/2013 1330   BILITOT 0.8 03/24/2018 1046   BILITOT 0.2 08/24/2017 1651   BILITOT 0.6 04/23/2013 1330   PROT 7.2 03/24/2018 1046   PROT 6.8 08/24/2017 1651   PROT 7.5 04/23/2013 1330   ALBUMIN 3.0 (L) 03/24/2018 1046   ALBUMIN 4.2 08/24/2017 1651   ALBUMIN  3.8 04/23/2013 1330    RADIOGRAPHIC STUDIES: Dg Chest 2 View  Result Date: 03/30/2018 CLINICAL  DATA:  Fever and shortness of breath for the past 2 days with clinical pneumonia. 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 EXAM: CHEST - 2 VIEW COMPARISON:  Portable chest x-ray of March 27, 2018 FINDINGS: The lungs are adequately inflated. There has been further interval increase in the interstitial infiltrates in the left lung. There is subtle increased density in the right upper lobe which is stable. There is no mediastinal shift. The heart and pulmonary vascularity are normal. The bony thorax exhibits no acute abnormality. IMPRESSION: Slightly increased interstitial densities throughout the left lung worrisome for pneumonia. Fairly stable density in the right pulmonary apex. No no overt CHF. Electronically Signed   By: David  Martinique M.D.   On: 03/30/2018 09:03   Dg Chest 2 View  Result Date: 03/24/2018 CLINICAL DATA:  Short of breath, cough, history of lung carcinoma with metastasis to the liver and brain EXAM: CHEST - 2 VIEW COMPARISON:  Portable chest x-ray of 03/05/2018 and CT chest of 02/04/2018 FINDINGS: There is more opacity now present within the left mid and lower lung suspicious for developing pneumonia. Somewhat coarse and prominent markings remain bilaterally as well. Mediastinal and hilar contours are unchanged and heart size is stable. The sclerotic thoracic vertebral body lesion is not well seen by plain film. IMPRESSION: 1. Increasing markings in the left upper and lower lung suspicious for developing pneumonia. 2. Stable chronic fibrotic change as well. Electronically Signed   By: Ivar Drape M.D.   On: 03/24/2018 11:24   Dg Chest 2 View  Result Date: 03/05/2018 CLINICAL DATA:  Fever EXAM: CHEST - 2 VIEW COMPARISON:  CT 02/04/2018, radiograph 11/01/2017 FINDINGS: No pleural effusion. Emphysematous disease. Coarse chronic interstitial opacity. Increased left upper and peripheral nodular opacity. Decreased nodularity in the right upper lobe. Normal heart  size. No pneumothorax. IMPRESSION: 1. Increased peripheral mid to upper lung opacity, slightly nodular in appearance, possible acute infection or inflammatory process. Lung nodules in this patient with history of lung cancer are also considered. 2. Underlying emphysematous disease and coarse interstitial disease. Slight decrease nodularity in the right upper lobe since prior radiograph. Electronically Signed   By: Donavan Foil M.D.   On: 03/05/2018 00:16   Ct Head Wo Contrast  Result Date: 03/30/2018 CLINICAL DATA:  In cephalopathy. History of metastatic lung cancer. EXAM: CT HEAD WITHOUT CONTRAST TECHNIQUE: Contiguous axial images were obtained from the base of the skull through the vertex without intravenous contrast. COMPARISON:  Brain MRI 02/24/2018 FINDINGS: Brain: Small focus of low density in the medial right occipital lobe corresponds to a previously treated metastasis. No intracranial mass effect, hemorrhage, acute infarct, or extra-axial fluid collection is identified. Mild cerebral atrophy is within normal limits for age. Vascular: Calcified atherosclerosis at the skull base. No hyperdense vessel. Skull: No fracture or focal osseous lesion. Sinuses/Orbits: Visualized paranasal sinuses and mastoid air cells are clear. Visualized orbits are unremarkable. Other: None. IMPRESSION: No evidence of acute intracranial abnormality. Electronically Signed   By: Logan Bores M.D.   On: 03/30/2018 11:01   Ct Angio Chest Pe W Or Wo Contrast  Result Date: 03/24/2018 CLINICAL DATA:  Pt sent from the [facility] with c/o chest pain for the past couple of days with fever. States O2 sats were low and placed pt on 2L [place] PTA. Patient with lung carcinoma. Was on chemotherapy at the  time of the CT dated 02/04/2018. EXAM: CT ANGIOGRAPHY CHEST WITH CONTRAST TECHNIQUE: Multidetector CT imaging of the chest was performed using the standard protocol during bolus administration of intravenous contrast. Multiplanar CT image  reconstructions and MIPs were obtained to evaluate the vascular anatomy. CONTRAST:  74m ISOVUE-370 IOPAMIDOL (ISOVUE-370) INJECTION 76% COMPARISON:  Current chest radiograph, and chest CTs 11/05/2017 and 02/04/2018. FINDINGS: Cardiovascular: There is satisfactory opacification of the pulmonary arteries to the segmental level. There is no evidence of a pulmonary embolism. Heart is normal in size. No pericardial effusion. Minor left coronary artery calcifications. Aorta is normal in caliber. No dissection. Minimal distal descending thoracic atherosclerosis. Mild atherosclerosis at the origin of the arch branch vessels without significant stenosis. Mediastinum/Nodes: Mildly enlarged, shotty left para carinal lymph node measuring 12 mm short axis, 16 mm on the exam dated 02/04/2018. Right subcarinal node measuring 13 mm in short axis, previously 13 mm. No new prominent or enlarged lymph nodes. No mediastinal masses. No neck base or axillary masses or enlarged lymph nodes. Soft tissue thickening along the right hilar structures is unchanged from the most recent prior study. No discrete hilar masses or enlarged lymph nodes. Lungs/Pleura: There are new areas of bilateral airspace disease. Ground-glass opacities and patchy peribronchovascular consolidation is noted in the left upper lobe. Ground-glass opacity with dependent consolidation is noted in the posterior left lower lobe extending to the lung base. There is left lower lobe peribronchovascular consolidation, most confluent at posterior lung base. Bilateral peribronchial thickening is noted similar to the most recent prior study. Other findings seen previously including soft tissue thickening along the right upper lobe bronchus and areas of nodular opacity are stable from the most recent prior exam consistent with the patient's known carcinoma. There is stable interstitial thickening most evident in the lower lungs. Trace left pleural effusion.  No pneumothorax.  Upper Abdomen: No acute abnormality. Musculoskeletal: Stable 13 mm colonic lesion in T4. No new bone lesions. No fracture or acute finding. Review of the MIP images confirms the above findings. IMPRESSION: 1. No evidence of a pulmonary embolism. 2. Bilateral areas of ground-glass and more confluent lung opacity consistent with multifocal infection or inflammation, new since the prior chest, abdomen and pelvis CT from 02/04/2018. 3. Findings of lung carcinoma described previously are without significant change. Aortic Atherosclerosis (ICD10-I70.0) and Emphysema (ICD10-J43.9). Electronically Signed   By: DLajean ManesM.D.   On: 03/24/2018 13:11   Dg Chest Port 1 View  Result Date: 03/27/2018 CLINICAL DATA:  Patient with cough. EXAM: PORTABLE CHEST 1 VIEW COMPARISON:  Chest radiograph 03/24/2018 FINDINGS: Stable cardiac and mediastinal contours. Similar-appearing left lung coarse interstitial opacities. Unchanged heterogeneous opacities right lung base. No pleural effusion or pneumothorax. IMPRESSION: 1. Similar-appearing left lung coarse interstitial opacities which may represent infection. Followup PA and lateral chest X-ray is recommended in 3-4 weeks following trial of antibiotic therapy to ensure resolution and exclude underlying malignancy. 2. Unchanged heterogeneous opacities right lung base which may represent atelectasis or infection. Electronically Signed   By: DLovey NewcomerM.D.   On: 03/27/2018 10:23   Dg Chest Port 1 View  Result Date: 03/05/2018 CLINICAL DATA:  Cough, metastatic lung cancer EXAM: PORTABLE CHEST 1 VIEW COMPARISON:  Chest radiograph from one day prior. FINDINGS: Stable cardiomediastinal silhouette with normal heart size. No pneumothorax. No pleural effusion. Extensive patchy reticulonodular opacities throughout both lungs have not appreciably changed. No acute superimposed consolidative airspace disease. IMPRESSION: Stable extensive patchy reticulonodular opacities throughout both  lungs, favor lymphangitic  tumor. No acute superimposed consolidative airspace disease. Electronically Signed   By: Ilona Sorrel M.D.   On: 03/05/2018 21:59    PERFORMANCE STATUS (ECOG) : 3 - Symptomatic, >50% confined to bed  Review of Systems As noted above. Otherwise, a complete review of systems is negative.  Physical Exam General: Thin, frail-appearing, in bed Cardiovascular: regular rate and rhythm Pulmonary: Coarse anterior fields Abdomen: soft, nontender, + bowel sounds Extremities: Trace pedal edema Skin: no rashes Neurological: Weakness but otherwise nonfocal  IMPRESSION: I met with patient and his wife regarding symptoms and goals.  I then met with wife in private.  Wife verbalized significant concern regarding patient's condition.  She says she has seen improvement and then decline in the recent days.  She says that she does not think that patient will return to his previous baseline.  She also suggested that she felt patient could be nearing end of life. She says that neither she nor her husband are interested in future treatment of the cancer.  She asks about hospice care in the event that patient declines or fails to improve.  I answered her questions about hospice.  However, she agreed to continue treating him to see if he clinically improves.  Wife is interested in ensuring that patient is comfortable and that his symptoms are well managed.  Patient has had delirium with intermittent visual hallucinations.  Delirium is likely multifactorial in setting of possible infection, acute illness, and polypharmacy.  Case discussed with Dr. Rogue Bussing.  Agree with reduced doses of steroids, which could precipitate steroid-induced psychosis.  Even at reduced dose, patient is still being given the recommended 1 mg/kg/day.  Would recommend avoiding benzodiazepines, which can be deliriogenic.  I agree with starting antipsychotics.  Risperidone 0.5 mg was ordered at bedtime.  Patient has  already received a 0.25 mg dose earlier today and seemed to tolerate it well.  Would consider adding an additional risperidone 0.5 mg tablet to take during daytime hours as needed.  Patient was on morphine but this was discontinued.  Opioids can also cause delirium.  However, both patient and wife report the morphine significantly improved his dyspnea.  For this reason, we will add back low-dose morphine 1 to 2 mg every 2 hours as needed.  Also recommended a bedside fan, which can help with dyspnea.  Discussed the importance of maintaining proper sleep/rest cycle with delirium.  Hopefully, risperidone at bedtime will help him rest.  If additional medication is required, would suggest trial of trazodone.  PLAN: 1.  Continue treatment/best supportive care 2.  Agree with risperidone. Titrate for effect. 3.  We will add back morphine as needed for dyspnea 4.  Use of a bedside fan as needed 5.  DNR 6.  Will follow   Time Total: 60 minutes  Visit consisted of counseling and education dealing with the complex and emotionally intense issues of symptom management and palliative care in the setting of serious and potentially life-threatening illness.Greater than 50%  of this time was spent counseling and coordinating care related to the above assessment and plan.  Signed by: Altha Harm, PhD, DNP, NP-C, [facility] [phone] (Work Cell)

## 2018-03-30 NOTE — Progress Notes (Signed)
#   discussed with Dr.ravishankar # discontinue evening dose of solumedrol after giving the 2 pm dose today.  # Start solumedrol 80mg /day starting 11/14 AM. # start infliximab- 5mg /kg dose today. Discussed AEs risks.  Discussed with cancer center pharmacy/orders in. # discussed with pt/family re: above in detail; agrees. Also discussed with Dr.Konidena/ RN.

## 2018-03-30 NOTE — Progress Notes (Signed)
Appears less dyspneic than Monday (recently received morphine) No new complaints  Vitals:   03/30/18 0419 03/30/18 0848 03/30/18 0855 03/30/18 1224  BP: (!) 150/86   (!) 133/93  Pulse: 87   (!) 105  Resp: 18     Temp: 98.1 F (36.7 C)   (!) 97.5 F (36.4 C)  TempSrc: Oral   Oral  SpO2: 98% 97% 96% 94%  Weight: 64.6 kg     Height:      5 LPM   Gen: NAD at rest HEENT: NCAT, sclera white Neck: No JVD Lungs: L>R crackles, no wheezes Cardiovascular: RRR, no murmurs Abdomen: Soft, nontender, normal BS Ext: without clubbing, cyanosis, edema Neuro: grossly intact Skin: Limited exam, no lesions noted   BMP Latest Ref Rng & Units 03/30/2018 03/28/2018 03/26/2018  Glucose 70 - 99 mg/dL 129(H) - 146(H)  BUN 8 - 23 mg/dL 23 - 20  Creatinine 0.61 - 1.24 mg/dL 0.62 0.64 0.68  BUN/Creat Ratio 10 - 24 - - -  Sodium 135 - 145 mmol/L 137 - 136  Potassium 3.5 - 5.1 mmol/L 4.5 - 4.3  Chloride 98 - 111 mmol/L 106 - 109  CO2 22 - 32 mmol/L 22 - 20(L)  Calcium 8.9 - 10.3 mg/dL 7.8(L) - 7.8(L)   CBC Latest Ref Rng & Units 03/29/2018 03/26/2018 03/25/2018  WBC 4.0 - 10.5 K/uL 8.2 14.6(H) 7.2  Hemoglobin 13.0 - 17.0 g/dL 9.6(L) 10.0(L) 10.4(L)  Hematocrit 39.0 - 52.0 % 30.0(L) 30.1(L) 31.5(L)  Platelets 150 - 400 K/uL 214 242 182   ESR 106 mm/hr BNP 107 LDH 179 PCT < 0.10 Fungitell, quant gold pending  CXR: No significant change left > right interstitial infiltrates   IMPRESSION: Moderate COPD Acute hypoxemic respiratory failure Pneumonitis, likely chemotherapy-induced  No measurable response to corticosteroids  PLAN/REC Agree with infliximab as initiated by Dr. Rogue Bussing Methylprednisolone dose reduced due to adverse side effects Continue supplemental oxygen at lowest flow necessary to maintain SPO2 >90% In the absence of wheezing, I have changed levalbuterol to as needed only Continue Symbicort inhaler Would recheck CXR 11/18 I will check on him again 11/15  Merton Border,  MD PCCM service Mobile [phone] Pager [phone] 03/30/2018 2:06 PM

## 2018-03-30 NOTE — Care Management Note (Signed)
Case Management Note  Patient Details  Name: James [name] MRN: [id number] Date of Birth: 08-20-1955  Subjective/Objective:  Admitted to [facility] with the diagnosis of pneumonia. Lives with wife, [name] [phone]). Last seen [facility] in June 2019. Goes to the [facility] for chemotherapy. History of Lung Cancer. Prescriptions are filled at [facility] in [facility] about a year following hip surgery. May have been [facility]. No skilled facility. No home oxygen. Nebulizer, rolling walker, and bedside commode in the home. Takes care of all basic activities of daily living himself, can drive, if needed. No falls. Decreased appetite.                   Action/Plan: Physical therapy is recommending home with home health PT; supervision. Discussed these recommendation with Mr& Ms Mosco. Would like to wait to make this decision , may need other services in the home   Expected Discharge Date:                  Expected Discharge Plan:     In-House Referral:   yes  Discharge planning Services   yes  Post Acute Care Choice:    Choice offered to:     DME Arranged:    DME Agency:     HH Arranged:    [place] Agency:     Status of Service:     If discussed at [employer] of Stay Meetings, dates discussed:    Additional Comments:  Shelbie Ammons, RN MSN CCM Care Management [phone] 03/30/2018, 2:50 PM

## 2018-03-30 NOTE — Progress Notes (Signed)
James [name]   DOB:May 04, 1956   IW#:[id number]    Subjective: Patient continues to be short of breath; no worsening cough.  No phlegm.  Continues to be needing 4 to 5 L of oxygen.  Patient unable to participate in physical therapy because of oxygen comment.  He continues have episodes of anxiety spells/air hunger-needing Xanax/morphine.  Objective:  Vitals:   03/30/18 0207 03/30/18 0419  BP:  (!) 150/86  Pulse:  87  Resp:  18  Temp:  98.1 F (36.7 C)  SpO2: 97% 98%     Intake/Output Summary (Last 24 hours) at 03/30/2018 0816 Last data filed at 03/30/2018 0514 Gross per 24 hour  Intake 1360.85 ml  Output 400 ml  Net 960.85 ml    GENERAL: Thin cachectic male patient resting in the bed.  No acute distress.  Currently on 5 LN.  EYES: no pallor or icterus OROPHARYNX: no thrush or ulceration. NECK: supple, no masses felt LYMPH:  no palpable lymphadenopathy in the cervical, axillary or inguinal regions LUNGS: decreased breath sounds to auscultation R>L.   No wheeze or crackles HEART/CVS: regular rate & rhythm and no murmurs; No lower extremity edema ABDOMEN: abdomen soft, non-tender and normal bowel sounds Musculoskeletal:no cyanosis of digits and no clubbing  PSYCH: alert & oriented x 3 with fluent speech NEURO: no focal motor/sensory deficits SKIN:  no rashes or significant lesions   Labs:  Lab Results  Component Value Date   WBC 8.2 03/29/2018   HGB 9.6 (L) 03/29/2018   HCT 30.0 (L) 03/29/2018   MCV 102.0 (H) 03/29/2018   PLT 214 03/29/2018   NEUTROABS 6.3 03/24/2018    Lab Results  Component Value Date   NA 136 03/26/2018   K 4.3 03/26/2018   CL 109 03/26/2018   CO2 20 (L) 03/26/2018    Studies:  No results found.  Assessment & Plan:   # 62 year old male patient history of metastatic adenocarcinoma the lung currently status post 4 cycles of chemo immunotherapy is admitted the hospital for worsening shortness of breath  # Metastatic adenocarcinoma the  lung-currently status post 4 cycles of carbotaxol-atezo+bev-CT scan shows STABLE Lung ca; bilateral infiltrates/groundglass [differential diagnosis includes lymphangitic spread; clinically less likely-see discussion below]  #Acute respiratory failure needing oxygen-bilateral groundglass infiltrates left more than right-suspicious of inflammatory pneumonitis secondary to immunotherapy/possible infectious pneumonia [less likely lymphangitic spread].  The patient currently on Solu-Medrol 40 mg every 8 hours-with some improvement but not a lot improvement.  Patient felt high risk for bronchoscopy by pulmonary; appreciate pulmonary evaluation.  Discussed with Dr. Alva Garnet.  Patient started on Bactrim per ID. Await further recommendations.   #Agitation/anxiety secondary to delirium-second steroids/polypharmacy-recommend palliative care evaluation regarding symptom management.  Discussed with [employer].   #DVT/GI prophylaxis  #The above plan of care was discussed with the patient.   Cammie Sickle, MD 03/30/2018  8:16 AM

## 2018-03-30 NOTE — Progress Notes (Signed)
[place] at [place] NAME: James [name]    MR#:  [id number]  DATE OF BIRTH:  06-Oct-1955  Hallucinating, more delirious today, wife at bedside.  Patient states that he sees things on the walls that are not there.  Clinically appears very dry.  Does not want to eat anything today.  Denies headache or blurred vision.  Complains of unable to breathe even though oxygen saturation 99% on 6 L.  CHIEF COMPLAINT:   Chief Complaint  Patient presents with  . Chest Pain   Visual hallucinations, appears slightly restless. REVIEW OF SYSTEMS:  Review of Systems  Constitutional: Positive for malaise/fatigue. Negative for chills and fever.  HENT: Negative for ear discharge, hearing loss and nosebleeds.   Respiratory: Positive for shortness of breath. Negative for cough and wheezing.   Cardiovascular: Negative for chest pain and palpitations.  Gastrointestinal: Negative for abdominal pain, constipation, diarrhea, nausea and vomiting.  Genitourinary: Negative for dysuria.  Musculoskeletal: Negative for myalgias.  Neurological: Negative for dizziness, focal weakness, seizures, weakness and headaches.  Psychiatric/Behavioral: Positive for hallucinations. Negative for depression.    DRUG ALLERGIES:  No Known Allergies  VITALS:  Blood pressure (!) 150/86, pulse 87, temperature 98.1 F (36.7 C), temperature source Oral, resp. rate 18, height 5\' 10"  (1.778 m), weight 64.6 kg, SpO2 96 %.  PHYSICAL EXAMINATION:  Physical Exam   GENERAL:  62 y.o.-year-old patient lying in the bed with no acute distress.  EYES: Pupils equal, round, reactive to light and accommodation. No scleral icterus. Extraocular muscles intact.  HEENT: Head atraumatic, normocephalic. Oropharynx and nasopharynx clear.  NECK:  Supple, no jugular venous distention. No thyroid enlargement, no tenderness.  LUNGS: Diminished air entry bilaterally but more so on the right side.Marland Kitchen CARDIOVASCULAR:  S1, S2 tachycardic. No  rubs, or gallops. 2/6 systolic murmur present ABDOMEN: Soft, nontender, nondistended. Bowel sounds present. No organomegaly or mass.  EXTREMITIES: No pedal edema, cyanosis, or clubbing.  NEUROLOGIC: Cranial nerves II through XII are intact. Muscle strength 5/5 in all extremities. Sensation intact. Gait not checked.  PSYCHIATRIC: The patient is alert and oriented x 3.  SKIN: No obvious rash, lesion, or ulcer.    LABORATORY PANEL:   CBC Recent Labs  Lab 03/29/18 0357  WBC 8.2  HGB 9.6*  HCT 30.0*  PLT 214   ------------------------------------------------------------------------------------------------------------------  Chemistries  Recent Labs  Lab 03/24/18 1046  03/30/18 1016  NA 131*   < > 137  K 4.3   < > 4.5  CL 99   < > 106  CO2 20*   < > 22  GLUCOSE 109*   < > 129*  BUN 14   < > 23  CREATININE 0.82   < > 0.62  CALCIUM 8.4*   < > 7.8*  AST 42*  --   --   ALT 43  --   --   ALKPHOS 63  --   --   BILITOT 0.8  --   --    < > = values in this interval not displayed.   ------------------------------------------------------------------------------------------------------------------  Cardiac Enzymes Recent Labs  Lab 03/24/18 2206  TROPONINI 0.13*   ------------------------------------------------------------------------------------------------------------------  RADIOLOGY:  Dg Chest 2 View  Result Date: 03/30/2018 CLINICAL DATA:  Fever and shortness of breath for the past 2 days with clinical pneumonia. 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 EXAM: CHEST - 2 VIEW COMPARISON:  Portable chest x-ray of March 27, 2018 FINDINGS:  The lungs are adequately inflated. There has been further interval increase in the interstitial infiltrates in the left lung. There is subtle increased density in the right upper lobe which is stable. There is no mediastinal shift. The heart and pulmonary vascularity are normal. The bony  thorax exhibits no acute abnormality. IMPRESSION: Slightly increased interstitial densities throughout the left lung worrisome for pneumonia. Fairly stable density in the right pulmonary apex. No no overt CHF. Electronically Signed   By: David  Martinique M.D.   On: 03/30/2018 09:03    EKG:   Orders placed or performed during the hospital encounter of 03/24/18  . ED EKG 12-Lead  . ED EKG 12-Lead  . 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  1.  Healthcare acquired pneumonia-also concern for pneumonitis due to his immunomodulator therapy for lung cancer. -Blood cultures are negative, MRSA PCR is negative. -Started on cefepime.  Discontinue vancomycin and Flagyl -Continue supplemental oxygen and wean as tolerated. -CT of the chest showing no pulmonary embolism, multifocal infection worse on the left side.  Seen by ID Dr. Steva Ready, started on Bactrim for possible opportunistic infection, today having more visual hallucinations.  ABG showed PCO2 31. Spoke with Dr. Rogue Bussing, recommended palliative care consult, possibly start infliximab for possible inflammatory lung condition .  Patient hypoxia is slowly improving, now on 4 L of oxygen and saturation 96%.  Continue IV antibiotics with cefepime, bronchodilators, small dose IV steroids.  Overall condition is stable ,no need for ICU transfer at this time. same explained to the wife.  Patient concerned about visual hallucination and wants to go to CCU stepdown.  Told him that critical care unit is for respiratory, cardiac, other medical problems like septic shock.  Patient is medically stable at this time and does not need transfer to ICU.  1 dose of Risperdal now, Risperdal at night.    -2.  Anemia of  chronic disease-stable at this time.  3.  Hypertension, sinus tachycardia: Patient is on metoprolol, Cardizem.  Stable.    4.  COPD-stable.  Continue inhalers.,  Changed bronchodilators to Xopenex due to tachycardia. No exacerbation at this time.   5.  DVT prophylaxis-Lovenox   #6 .  Metastatic adenocarcinoma of the lung, metastatic brain: Received radiation  To brain. .  #7.Marland Kitchenanxiety, possible steroid psychosis: Discontinue Ativan, Xanax, use Risperdal, palliative care consult for symptom management requested.  We will do a CT of the head later just to make sure no acute problem there.  #8 .possible dehydration, patient renal function is stable but he is not drinking and clinically appears dry so we will give him half liter fluid bolus of  Normal saline.   More than 50% time spent in counseling, coordination of care. Discussed with wife.    All the records are reviewed and case discussed with Care Management/Social Workerr. Management plans discussed with the patient, family and they are in agreement.  CODE STATUS: DNR  TOTAL TIME TAKING CARE OF THIS PATIENT: 38 minutes.   POSSIBLE D/C IN 2 DAYS, DEPENDING ON CLINICAL CONDITION.   Epifanio Lesches M.D on 03/30/2018 at 11:02 AM  Between 7am to 6pm - Pager - [phone]  After 6pm go to [email or web address] - password [email or web address]  [employer]  Office  [phone]  CC: Primary care physician; Valerie Roys, DO

## 2018-03-31 DIAGNOSIS — C799 Secondary malignant neoplasm of unspecified site: Secondary | ICD-10-CM

## 2018-03-31 DIAGNOSIS — F05 Delirium due to known physiological condition: Secondary | ICD-10-CM

## 2018-03-31 DIAGNOSIS — Z9981 Dependence on supplemental oxygen: Secondary | ICD-10-CM

## 2018-03-31 LAB — HISTOPLASMA ANTIGEN, URINE: Histoplasma Antigen, urine: <0.5 (ref <0.5)

## 2018-03-31 LAB — CREATININE, SERUM
Creatinine, Ser: 0.63 mg/dL (ref 0.61–1.24)
GFR calc non Af Amer: >60 mL/min (ref >60)

## 2018-03-31 LAB — QUANTIFERON-TB GOLD PLUS (RQFGPL)
QUANTIFERON NIL VALUE: 0.05 [IU]/mL
QUANTIFERON TB1 AG VALUE: 0.06 [IU]/mL
QuantiFERON Mitogen Value: 0.08 IU/mL
QuantiFERON TB2 Ag Value: 0.07 IU/mL

## 2018-03-31 LAB — QUANTIFERON-TB GOLD PLUS: QUANTIFERON-TB GOLD PLUS: UNDETERMINED

## 2018-03-31 LAB — CRYPTOCOCCUS ANTIGEN, SERUM: Cryptococcus Antigen, Serum: NEGATIVE

## 2018-03-31 MED ORDER — SENNA 8.6 MG PO TABS
1.0000 | ORAL_TABLET | Freq: Every day | ORAL | Status: DC | PRN
  Administered 2018-03-31: 17:00:00 8.6 mg via ORAL
  Filled 2018-03-31: qty 1

## 2018-03-31 MED ORDER — BENZONATATE 100 MG PO CAPS: 200.0000 mg | ORAL_CAPSULE | Freq: Three times a day (TID) | ORAL | Status: DC | PRN

## 2018-03-31 MED ORDER — TRAZODONE HCL 50 MG PO TABS
50.0000 mg | ORAL_TABLET | Freq: Every evening | ORAL | Status: DC | PRN
  Administered 2018-03-31 – 2018-04-03 (×5): 50 mg via ORAL
  Filled 2018-03-31 (×4): qty 1

## 2018-03-31 MED ORDER — TRAZODONE HCL 50 MG PO TABS
ORAL_TABLET | ORAL | Status: AC
  Administered 2018-03-31: 01:00:00 50 mg via ORAL
  Filled 2018-03-31: qty 1

## 2018-03-31 MED ORDER — GUAIFENESIN ER 600 MG PO TB12
600.0000 mg | ORAL_TABLET | Freq: Two times a day (BID) | ORAL | Status: DC
  Administered 2018-03-31 – 2018-04-04 (×8): 600 mg via ORAL
  Filled 2018-03-31 (×8): qty 1

## 2018-03-31 NOTE — Progress Notes (Signed)
[place] at [place] NAME: James [name]    MR#:  [id number]  DATE OF BIRTH:  February 21, 1956  SUBJECTIVE:  CHIEF COMPLAINT:   Chief Complaint  Patient presents with  . Chest Pain  Patient feeling better, wife at the bedside, oncology/pulmonology/palliative care/infectious disease input appreciated  REVIEW OF SYSTEMS:  CONSTITUTIONAL: No fever, fatigue or weakness.  EYES: No blurred or double vision.  EARS, NOSE, AND THROAT: No tinnitus or ear pain.  RESPIRATORY: No cough, shortness of breath, wheezing or hemoptysis.  CARDIOVASCULAR: No chest pain, orthopnea, edema.  GASTROINTESTINAL: No nausea, vomiting, diarrhea or abdominal pain.  GENITOURINARY: No dysuria, hematuria.  ENDOCRINE: No polyuria, nocturia,  HEMATOLOGY: No anemia, easy bruising or bleeding SKIN: No rash or lesion. MUSCULOSKELETAL: No joint pain or arthritis.   NEUROLOGIC: No tingling, numbness, weakness.  PSYCHIATRY: No anxiety or depression.   ROS  DRUG ALLERGIES:  No Known Allergies  VITALS:  Blood pressure (!) 141/91, pulse 97, temperature 97.8 F (36.6 C), temperature source Oral, resp. rate 20, height '5\' 10"'  (1.778 m), weight 60.4 kg, SpO2 97 %.  PHYSICAL EXAMINATION:  GENERAL:  62 y.o.-year-old patient lying in the bed with no acute distress.  EYES: Pupils equal, round, reactive to light and accommodation. No scleral icterus. Extraocular muscles intact.  HEENT: Head atraumatic, normocephalic. Oropharynx and nasopharynx clear.  NECK:  Supple, no jugular venous distention. No thyroid enlargement, no tenderness.  LUNGS: Normal breath sounds bilaterally, no wheezing, rales,rhonchi or crepitation. No use of accessory muscles of respiration.  CARDIOVASCULAR: S1, S2 normal. No murmurs, rubs, or gallops.  ABDOMEN: Soft, nontender, nondistended. Bowel sounds present. No organomegaly or mass.  EXTREMITIES: No pedal edema, cyanosis, or clubbing.  NEUROLOGIC: Cranial nerves II  through XII are intact. Muscle strength 5/5 in all extremities. Sensation intact. Gait not checked.  PSYCHIATRIC: The patient is alert and oriented x 3.  SKIN: No obvious rash, lesion, or ulcer.   Physical Exam LABORATORY PANEL:   CBC Recent Labs  Lab 03/29/18 0357  WBC 8.2  HGB 9.6*  HCT 30.0*  PLT 214   ------------------------------------------------------------------------------------------------------------------  Chemistries  Recent Labs  Lab 03/30/18 1016 03/31/18 0532  NA 137  --   K 4.5  --   CL 106  --   CO2 22  --   GLUCOSE 129*  --   BUN 23  --   CREATININE 0.62 0.63  CALCIUM 7.8*  --    ------------------------------------------------------------------------------------------------------------------  Cardiac Enzymes Recent Labs  Lab 03/24/18 1502 03/24/18 2206  TROPONINI 0.13* 0.13*   ------------------------------------------------------------------------------------------------------------------  RADIOLOGY:  Dg Chest 2 View  Result Date: 03/30/2018 CLINICAL DATA:  Fever and shortness of breath for the past 2 days with clinical pneumonia. 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 EXAM: CHEST - 2 VIEW COMPARISON:  Portable chest x-ray of March 27, 2018 FINDINGS: The lungs are adequately inflated. There has been further interval increase in the interstitial infiltrates in the left lung. There is subtle increased density in the right upper lobe which is stable. There is no mediastinal shift. The heart and pulmonary vascularity are normal. The bony thorax exhibits no acute abnormality. IMPRESSION: Slightly increased interstitial densities throughout the left lung worrisome for pneumonia. Fairly stable density in the right pulmonary apex. No no overt CHF. Electronically Signed   By: David  Martinique M.D.   On: 03/30/2018 09:03   Ct Head Wo Contrast  Result Date: 03/30/2018 CLINICAL DATA:  In  cephalopathy. History of metastatic  lung cancer. EXAM: CT HEAD WITHOUT CONTRAST TECHNIQUE: Contiguous axial images were obtained from the base of the skull through the vertex without intravenous contrast. COMPARISON:  Brain MRI 02/24/2018 FINDINGS: Brain: Small focus of low density in the medial right occipital lobe corresponds to a previously treated metastasis. No intracranial mass effect, hemorrhage, acute infarct, or extra-axial fluid collection is identified. Mild cerebral atrophy is within normal limits for age. Vascular: Calcified atherosclerosis at the skull base. No hyperdense vessel. Skull: No fracture or focal osseous lesion. Sinuses/Orbits: Visualized paranasal sinuses and mastoid air cells are clear. Visualized orbits are unremarkable. Other: None. IMPRESSION: No evidence of acute intracranial abnormality. Electronically Signed   By: Logan Bores M.D.   On: 03/30/2018 11:01    ASSESSMENT AND PLAN:  62 year old male with past medical history significant for adenocarcinoma of the right upper lung with solitary lesion to brain status post radiation, liver mets and bone mets presents to hospital secondary to worsening shortness of breath and fever for 2 days.  *Acute on chronic hypoxic respiratory failure Resolving Secondary to pneumonia, lung cancer-on chemo/radiation  *Acute b/l HCAP Concerned for pneumonitis due to his immunomodulator therapy for lung cancer Is currently on cefepime/Bactrim -concern for opportunistic infection, infectious disease input appreciated On IV Solu-Medrol as recommended by Dr. Brahmanday/oncology Wean O2 off as tolerated, physical therapy continuation, mucolytic agents, pulmonary toilet with bronchodilator therapy  *Chronic lung cancer with brain met On chemo and radiation Per Dr. Rogue Bussing - for second dose of infliximab for possible inflammatory lung condition on tomorrow, antibiotics per above  *Acute psychosis Resolved Continue Risperdal at bedtime  *Anemia of chronic  disease Stable  *Chronic benign essential hypertension  Controlled on current regiment  *COPD Continue current regiment, pulmonology input greatly appreciated, wean O2 off as tolerated  *anxiety Stable on current regiment  *Acute dehydration  IV fluids for rehydration  Disposition to inpatient rehab/skilled nursing facility status post discharge in 1 to 2 days once cleared by subspecialty Palliative care following  All the records are reviewed and case discussed with Care Management/Social Workerr. Management plans discussed with the patient, family and they are in agreement.  CODE STATUS: dnr  TOTAL TIME TAKING CARE OF THIS PATIENT: 45 minutes.     POSSIBLE D/C IN 1-3 DAYS, DEPENDING ON CLINICAL CONDITION.   Avel Peace Salary M.D on 03/31/2018   Between 7am to 6pm - Pager - [phone]  After 6pm go to [email or web address] - password [email or web address] [facility]  Office  [phone]  CC: Primary care physician; Valerie Roys, DO  Note: This dictation was prepared with Dragon dictation along with smaller phrase technology. Any transcriptional errors that result from this process are unintentional.

## 2018-03-31 NOTE — Progress Notes (Signed)
James [name]  Telephone:([phone] [phone]   Name: James [name] Date: 03/31/2018 MRN: [id number]  DOB: 06/14/1955  Patient Care Team: Valerie Roys, DO as PCP - General (Family Medicine) Telford Nab, RN as Registered Nurse Rogue Bussing, Elisha Headland, MD as Medical Oncologist (Medical Oncology)    REASON FOR CONSULTATION: Palliative Care consult requested for this 62 y.o. male with multiple medical problems including stage IV lung cancer last treated in October with chemotherapy/immunotherapy, history of tobacco abuse, and COPD, who was recently hospitalized 03/04/2018 to 03/07/2018 with shortness of breath, nausea and vomiting after receiving chemotherapy.  He was treated empirically for possible pneumonia.  Admitted on 03/24/2018 with fevers and hypoxic respiratory failure.  Chest CT revealed bilateral areas of groundglass appearance consistent with multifocal infection or inflammation.  Patient has been treated empirically for possible pneumonia versus pneumonitis from the immunotherapy.  Patient's hospitalization has been further complicated by persistent shortness of breath and delirium.  Palliative care was consulted to help manage symptoms and address goals.   CODE STATUS: DNR  PAST MEDICAL HISTORY: Past Medical History:  Diagnosis Date  . Cancer ([place]) 12/01/2017   Primary cancer of right upper lobe of lung. Mets to liver, brain.  Marland Kitchen Prepatellar bursitis of left knee    patient denies    PAST SURGICAL HISTORY:  Past Surgical History:  Procedure Laterality Date  . collapsed lung  2012   "had to reinflate" " i carried large piece of sheet rock up stairs by myself "   . [place]   x2 , second was with mesh   . TOTAL HIP ARTHROPLASTY Right 04/09/2017   Procedure: RIGHT TOTAL HIP ARTHROPLASTY ANTERIOR APPROACH;  Surgeon: Mcarthur Rossetti, MD;  Location: [facility];  Service: Orthopedics;  Laterality:  Right;  . URETHRAL STRICTURE DILATATION      HEMATOLOGY/ONCOLOGY HISTORY:  Oncology History   # July 2019- [facility]- RUL [s/p Liver Bx]-CT chest- RUL/ liver/ brain solitary lesion.  to contralateral lung; pleura; liver and brain.July 2019- A/P- liver metastases at least 2; bone scan shows right humeral; left fifth rib; L2 vertebral body metastases.  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  --------------------------------------------------------------------   DIAGNOSIS: Adenocarcinoma lung  STAGE: 4       ;GOALS: Palliative  CURRENT/MOST RECENT THERAPY -CARBO+TAXOL+ATEZO+AVASTIN      Primary cancer of right upper lobe of lung ([place])   11/24/2017 -  Chemotherapy    The patient had bevacizumab (AVASTIN) 1,000 mg in sodium chloride 0.9 % 100 mL chemo infusion, 15 mg/kg = 1,000 mg, Intravenous,  Once, 4 of 4 cycles Administration: 1,000 mg (12/16/2017), 1,000 mg (01/19/2018), 1,000 mg (02/09/2018), 1,000 mg (03/03/2018)  for chemotherapy treatment.     12/09/2017 -  Chemotherapy    The patient had palonosetron (ALOXI) injection 0.25 mg, 0.25 mg, Intravenous,  Once, 4 of 4 cycles Administration: 0.25 mg (12/16/2017), 0.25 mg (01/19/2018), 0.25 mg (02/09/2018), 0.25 mg (03/03/2018) CARBOplatin (PARAPLATIN) 670 mg in sodium chloride 0.9 % 250 mL chemo infusion, 670 mg (100 % of original dose 667.8 mg), Intravenous,  Once, 4 of 4 cycles Dose modification:   (original dose 667.8 mg, Cycle 1, Reason: Provider Judgment) Administration: 670 mg (12/16/2017), 610 mg (01/19/2018), 610 mg (02/09/2018) PACLitaxel (TAXOL) 360 mg in sodium chloride 0.9 % 500 mL chemo infusion (> 80mg /m2), 200 mg/m2 = 360 mg, Intravenous,  Once, 4 of 4 cycles Administration: 360 mg (12/16/2017), 360 mg (01/19/2018), 360 mg (02/09/2018), 360 mg (03/03/2018) atezolizumab (TECENTRIQ) 1,200 mg in sodium chloride 0.9 % 250 mL chemo infusion,  1,200 mg, Intravenous, Once, 4 of 4 cycles Administration: 1,200 mg (12/16/2017), 1,200 mg (01/19/2018), 1,200 mg (02/09/2018), 1,200 mg (03/03/2018)  for chemotherapy treatment.      ALLERGIES:  has No Known Allergies.  MEDICATIONS:  Current Facility-Administered Medications  Medication Dose Route Frequency Provider Last Rate Last Dose  . 0.9 %  sodium chloride infusion   Intravenous PRN Epifanio Lesches, MD 10 mL/hr at 03/30/18 0514    . acetaminophen (TYLENOL) tablet 650 mg  650 mg Oral Q6H PRN Hillary Bow, MD   650 mg at 03/26/18 1618   Or  . acetaminophen (TYLENOL) suppository 650 mg  650 mg Rectal Q6H PRN Sudini, Alveta Heimlich, MD      . benzonatate (TESSALON) capsule 200 mg  200 mg Oral TID PRN Hillary Bow, MD   200 mg at 03/28/18 2150  . diltiazem (CARDIZEM) tablet 30 mg  30 mg Oral Q8H Epifanio Lesches, MD   30 mg at 03/31/18 0538  . enoxaparin (LOVENOX) injection 40 mg  40 mg Subcutaneous Q2200 Hillary Bow, MD   40 mg at 03/30/18 2108  . feeding supplement (BOOST / RESOURCE BREEZE) liquid 1 Container  1 Container Oral TID BM Epifanio Lesches, MD   1 Container at 03/31/18 1112  . feeding supplement (NEPRO CARB STEADY) liquid 237 mL  237 mL Oral BID BM Epifanio Lesches, MD      . guaiFENesin (MUCINEX) 12 hr tablet 600 mg  600 mg Oral BID Salary, Montell D, MD      . levalbuterol (XOPENEX) nebulizer solution 0.63 mg  0.63 mg Nebulization Q6H PRN Wilhelmina Mcardle, MD      . LORazepam (ATIVAN) injection 0.5 mg  0.5 mg Intravenous Once Gladstone Lighter, MD      . methylPREDNISolone sodium succinate (SOLU-MEDROL) 125 mg/2 mL injection 80 mg  80 mg Intravenous Daily Charlaine Dalton R, MD   80 mg at 03/31/18 1110  . metoprolol tartrate (LOPRESSOR) injection 5 mg  5 mg Intravenous Q4H PRN Dustin Flock, MD   5 mg at 03/29/18 0019  . metoprolol tartrate (LOPRESSOR) tablet 50 mg  50 mg Oral BID Epifanio Lesches, MD   50 mg at 03/31/18 1110  . mometasone-formoterol  (DULERA) 100-5 MCG/ACT inhaler 2 puff  2 puff Inhalation BID Hillary Bow, MD   2 puff at 03/31/18 1111  . morphine 2 MG/ML injection 1-2 mg  1-2 mg Intravenous Q2H PRN Cammie Sickle, MD   2 mg at 03/31/18 0026  . multivitamin with minerals tablet 1 tablet  1 tablet Oral Daily Epifanio Lesches, MD   1 tablet at 03/31/18 1110  . ondansetron (ZOFRAN) tablet 4 mg  4 mg Oral Q6H PRN Hillary Bow, MD       Or  . ondansetron (ZOFRAN) injection 4 mg  4 mg Intravenous Q6H PRN Hillary Bow, MD   4 mg at 03/24/18 1736  . pantoprazole (PROTONIX) EC tablet 20 mg  20 mg Oral BID Charlaine Dalton R, MD   20 mg at 03/31/18 1110  . polyethylene glycol (MIRALAX / GLYCOLAX) packet 17 g  17 g Oral Daily PRN Sudini, Alveta Heimlich, MD      . risperiDONE (RISPERDAL M-TABS) disintegrating tablet 0.5 mg  0.5 mg Oral QHS Epifanio Lesches, MD   0.5 mg at 03/30/18 2109  . sodium  chloride flush (NS) 0.9 % injection 3 mL  3 mL Intravenous Q12H Sudini, Srikar, MD   3 mL at 03/31/18 1111  . sulfamethoxazole-trimethoprim (BACTRIM DS,SEPTRA DS) 800-160 MG per tablet 1 tablet  1 tablet Oral Q24H Tsosie Billing, MD   1 tablet at 03/31/18 1110  . traZODone (DESYREL) tablet 50 mg  50 mg Oral QHS PRN Lance Coon, MD   50 mg at 03/31/18 0115    VITAL SIGNS: BP (!) 161/100 (BP Location: Left Arm)   Pulse 87   Temp (!) 97.3 F (36.3 C) (Oral)   Resp 16   Ht 5\' 10"  (1.778 m)   Wt 133 lb 3.2 oz (60.4 kg)   SpO2 94%   BMI 19.11 kg/m  Filed Weights   03/29/18 0500 03/30/18 0419 03/31/18 0410  Weight: 140 lb 14.4 oz (63.9 kg) 142 lb 6.7 oz (64.6 kg) 133 lb 3.2 oz (60.4 kg)    Estimated body mass index is 19.11 kg/m as calculated from the following:   Height as of this encounter: 5\' 10"  (1.778 m).   Weight as of this encounter: 133 lb 3.2 oz (60.4 kg).  LABS: CBC:    Component Value Date/Time   WBC 8.2 03/29/2018 0357   HGB 9.6 (L) 03/29/2018 0357   HGB 13.3 08/24/2017 1651   HCT 30.0 (L)  03/29/2018 0357   HCT 40.5 08/24/2017 1651   PLT 214 03/29/2018 0357   PLT 268 08/24/2017 1651   MCV 102.0 (H) 03/29/2018 0357   MCV 99 (H) 08/24/2017 1651   MCV 96 04/23/2013 1330   NEUTROABS 6.3 03/24/2018 1046   NEUTROABS 3.8 08/24/2017 1651   LYMPHSABS 0.6 (L) 03/24/2018 1046   LYMPHSABS 1.5 08/24/2017 1651   MONOABS 1.0 03/24/2018 1046   EOSABS 0.1 03/24/2018 1046   EOSABS 0.3 08/24/2017 1651   BASOSABS 0.0 03/24/2018 1046   BASOSABS 0.0 08/24/2017 1651   Comprehensive Metabolic Panel:    Component Value Date/Time   NA 137 03/30/2018 1016   NA 138 08/24/2017 1651   NA 137 04/23/2013 1330   K 4.5 03/30/2018 1016   K 3.8 04/23/2013 1330   CL 106 03/30/2018 1016   CL 105 04/23/2013 1330   CO2 22 03/30/2018 1016   CO2 22 04/23/2013 1330   BUN 23 03/30/2018 1016   BUN 18 08/24/2017 1651   BUN 18 04/23/2013 1330   CREATININE 0.63 03/31/2018 0532   CREATININE 0.90 04/23/2013 1330   GLUCOSE 129 (H) 03/30/2018 1016   GLUCOSE 120 (H) 04/23/2013 1330   CALCIUM 7.8 (L) 03/30/2018 1016   CALCIUM 9.3 04/23/2013 1330   AST 42 (H) 03/24/2018 1046   AST 11 (L) 04/23/2013 1330   ALT 43 03/24/2018 1046   ALT 18 04/23/2013 1330   ALKPHOS 63 03/24/2018 1046   ALKPHOS 64 04/23/2013 1330   BILITOT 0.8 03/24/2018 1046   BILITOT 0.2 08/24/2017 1651   BILITOT 0.6 04/23/2013 1330   PROT 7.2 03/24/2018 1046   PROT 6.8 08/24/2017 1651   PROT 7.5 04/23/2013 1330   ALBUMIN 3.0 (L) 03/24/2018 1046   ALBUMIN 4.2 08/24/2017 1651   ALBUMIN 3.8 04/23/2013 1330    RADIOGRAPHIC STUDIES: Dg Chest 2 View  Result Date: 03/30/2018 CLINICAL DATA:  Fever and shortness of breath for the past 2 days with clinical pneumonia. 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 EXAM: CHEST - 2 VIEW COMPARISON:  Portable chest x-ray of March 27, 2018 FINDINGS: The lungs are  adequately inflated. There has been further interval increase in the interstitial infiltrates in the left  lung. There is subtle increased density in the right upper lobe which is stable. There is no mediastinal shift. The heart and pulmonary vascularity are normal. The bony thorax exhibits no acute abnormality. IMPRESSION: Slightly increased interstitial densities throughout the left lung worrisome for pneumonia. Fairly stable density in the right pulmonary apex. No no overt CHF. Electronically Signed   By: David  Martinique M.D.   On: 03/30/2018 09:03   Dg Chest 2 View  Result Date: 03/24/2018 CLINICAL DATA:  Short of breath, cough, history of lung carcinoma with metastasis to the liver and brain EXAM: CHEST - 2 VIEW COMPARISON:  Portable chest x-ray of 03/05/2018 and CT chest of 02/04/2018 FINDINGS: There is more opacity now present within the left mid and lower lung suspicious for developing pneumonia. Somewhat coarse and prominent markings remain bilaterally as well. Mediastinal and hilar contours are unchanged and heart size is stable. The sclerotic thoracic vertebral body lesion is not well seen by plain film. IMPRESSION: 1. Increasing markings in the left upper and lower lung suspicious for developing pneumonia. 2. Stable chronic fibrotic change as well. Electronically Signed   By: Ivar Drape M.D.   On: 03/24/2018 11:24   Dg Chest 2 View  Result Date: 03/05/2018 CLINICAL DATA:  Fever EXAM: CHEST - 2 VIEW COMPARISON:  CT 02/04/2018, radiograph 11/01/2017 FINDINGS: No pleural effusion. Emphysematous disease. Coarse chronic interstitial opacity. Increased left upper and peripheral nodular opacity. Decreased nodularity in the right upper lobe. Normal heart size. No pneumothorax. IMPRESSION: 1. Increased peripheral mid to upper lung opacity, slightly nodular in appearance, possible acute infection or inflammatory process. Lung nodules in this patient with history of lung cancer are also considered. 2. Underlying emphysematous disease and coarse interstitial disease. Slight decrease nodularity in the right upper  lobe since prior radiograph. Electronically Signed   By: Donavan Foil M.D.   On: 03/05/2018 00:16   Ct Head Wo Contrast  Result Date: 03/30/2018 CLINICAL DATA:  In cephalopathy. History of metastatic lung cancer. EXAM: CT HEAD WITHOUT CONTRAST TECHNIQUE: Contiguous axial images were obtained from the base of the skull through the vertex without intravenous contrast. COMPARISON:  Brain MRI 02/24/2018 FINDINGS: Brain: Small focus of low density in the medial right occipital lobe corresponds to a previously treated metastasis. No intracranial mass effect, hemorrhage, acute infarct, or extra-axial fluid collection is identified. Mild cerebral atrophy is within normal limits for age. Vascular: Calcified atherosclerosis at the skull base. No hyperdense vessel. Skull: No fracture or focal osseous lesion. Sinuses/Orbits: Visualized paranasal sinuses and mastoid air cells are clear. Visualized orbits are unremarkable. Other: None. IMPRESSION: No evidence of acute intracranial abnormality. Electronically Signed   By: Logan Bores M.D.   On: 03/30/2018 11:01   Ct Angio Chest Pe W Or Wo Contrast  Result Date: 03/24/2018 CLINICAL DATA:  Pt sent from the [facility] with c/o chest pain for the past couple of days with fever. States O2 sats were low and placed pt on 2L [place] PTA. Patient with lung carcinoma. Was on chemotherapy at the time of the CT dated 02/04/2018. EXAM: CT ANGIOGRAPHY CHEST WITH CONTRAST TECHNIQUE: Multidetector CT imaging of the chest was performed using the standard protocol during bolus administration of intravenous contrast. Multiplanar CT image reconstructions and MIPs were obtained to evaluate the vascular anatomy. CONTRAST:  68mL ISOVUE-370 IOPAMIDOL (ISOVUE-370) INJECTION 76% COMPARISON:  Current chest radiograph, and chest CTs 11/05/2017 and  02/04/2018. FINDINGS: Cardiovascular: There is satisfactory opacification of the pulmonary arteries to the segmental level. There is no evidence of a  pulmonary embolism. Heart is normal in size. No pericardial effusion. Minor left coronary artery calcifications. Aorta is normal in caliber. No dissection. Minimal distal descending thoracic atherosclerosis. Mild atherosclerosis at the origin of the arch branch vessels without significant stenosis. Mediastinum/Nodes: Mildly enlarged, shotty left para carinal lymph node measuring 12 mm short axis, 16 mm on the exam dated 02/04/2018. Right subcarinal node measuring 13 mm in short axis, previously 13 mm. No new prominent or enlarged lymph nodes. No mediastinal masses. No neck base or axillary masses or enlarged lymph nodes. Soft tissue thickening along the right hilar structures is unchanged from the most recent prior study. No discrete hilar masses or enlarged lymph nodes. Lungs/Pleura: There are new areas of bilateral airspace disease. Ground-glass opacities and patchy peribronchovascular consolidation is noted in the left upper lobe. Ground-glass opacity with dependent consolidation is noted in the posterior left lower lobe extending to the lung base. There is left lower lobe peribronchovascular consolidation, most confluent at posterior lung base. Bilateral peribronchial thickening is noted similar to the most recent prior study. Other findings seen previously including soft tissue thickening along the right upper lobe bronchus and areas of nodular opacity are stable from the most recent prior exam consistent with the patient's known carcinoma. There is stable interstitial thickening most evident in the lower lungs. Trace left pleural effusion.  No pneumothorax. Upper Abdomen: No acute abnormality. Musculoskeletal: Stable 13 mm colonic lesion in T4. No new bone lesions. No fracture or acute finding. Review of the MIP images confirms the above findings. IMPRESSION: 1. No evidence of a pulmonary embolism. 2. Bilateral areas of ground-glass and more confluent lung opacity consistent with multifocal infection or  inflammation, new since the prior chest, abdomen and pelvis CT from 02/04/2018. 3. Findings of lung carcinoma described previously are without significant change. Aortic Atherosclerosis (ICD10-I70.0) and Emphysema (ICD10-J43.9). Electronically Signed   By: Lajean Manes M.D.   On: 03/24/2018 13:11   Dg Chest Port 1 View  Result Date: 03/27/2018 CLINICAL DATA:  Patient with cough. EXAM: PORTABLE CHEST 1 VIEW COMPARISON:  Chest radiograph 03/24/2018 FINDINGS: Stable cardiac and mediastinal contours. Similar-appearing left lung coarse interstitial opacities. Unchanged heterogeneous opacities right lung base. No pleural effusion or pneumothorax. IMPRESSION: 1. Similar-appearing left lung coarse interstitial opacities which may represent infection. Followup PA and lateral chest X-ray is recommended in 3-4 weeks following trial of antibiotic therapy to ensure resolution and exclude underlying malignancy. 2. Unchanged heterogeneous opacities right lung base which may represent atelectasis or infection. Electronically Signed   By: Lovey Newcomer M.D.   On: 03/27/2018 10:23   Dg Chest Port 1 View  Result Date: 03/05/2018 CLINICAL DATA:  Cough, metastatic lung cancer EXAM: PORTABLE CHEST 1 VIEW COMPARISON:  Chest radiograph from one day prior. FINDINGS: Stable cardiomediastinal silhouette with normal heart size. No pneumothorax. No pleural effusion. Extensive patchy reticulonodular opacities throughout both lungs have not appreciably changed. No acute superimposed consolidative airspace disease. IMPRESSION: Stable extensive patchy reticulonodular opacities throughout both lungs, favor lymphangitic tumor. No acute superimposed consolidative airspace disease. Electronically Signed   By: Ilona Sorrel M.D.   On: 03/05/2018 21:59    PERFORMANCE STATUS (ECOG) : 3 - Symptomatic, >50% confined to bed  Review of Systems As noted above. Otherwise, a complete review of systems is negative.  Physical Exam General: NAD,  frail appearing, thin Cardiovascular: regular rate and  rhythm Pulmonary: coarse ant fields, but appears to be less short of breath today Abdomen: soft, nontender, + bowel sounds Extremities: no edema, no joint deformities Skin: no rashes Neurological: Weakness but otherwise nonfocal  IMPRESSION: Spoke with patient and wife. Patient says he is less short of breath today. Seems more comfortable. He has had multiple doses of morphine in past 24 hours, which he reports is helping. He had dose of risperidone last night and seemed to tolerate it well. He has less visual hallucinations today.   He is constipated. Will liberalize bowel regimen.   There has been discussion about rehab post discharge. Wife understandably wants to ensure that patient is stable prior to discharge from the hospital.   PLAN: Continue morphine prn for comfort Consider adding a dose of risperidone to take daily as needed in addition to bedtime dose Continue medical treatment/supportive care   Time Total: 25 minutes  Visit consisted of counseling and education dealing with the complex and emotionally intense issues of symptom management and palliative care in the setting of serious and potentially life-threatening illness.Greater than 50%  of this time was spent counseling and coordinating care related to the above assessment and plan.  Signed by: Altha Harm, PhD, DNP, NP-C, [facility] [phone] (Work Cell)

## 2018-03-31 NOTE — Progress Notes (Signed)
SNF and Non-Emergent EMS Transport Benefits:  Number called: [id number] Rep: Chloe Reference Number: [id number]  [facility] [employer] PPO plan effective as of 05/18/17.  Benefit period runs 05/18/17 - 05/17/18.  In-network deductible is $1,080, which has been met.  In-network out of pocket is [phone], which has been met.    In-network SNF: covered at 100% of the allowed amount for services as both deductible and out of pocket amounts met for benefit year.  Limited to 100 days, all available.  Per rep, Josem Kaufmann is not required.    Non-emergent EMS transport: Covered at 100% of the allowed amount for services. Must be medically necessary. Auth not required for ground transport.

## 2018-03-31 NOTE — Progress Notes (Signed)
James [name]   DOB:[birth year]   JG#:[id number]    Subjective: The patient received infliximab infusion yesterday.  Infusion was uneventful.   He continues to have shortness of breath; continues to been 5 L.  Nausea no vomiting.  No headaches.  No abdominal pain.  Had episode of " panic attack" yesterday for which he needed morphine/air hunger.  Objective:  Vitals:   03/31/18 0409 03/31/18 0750  BP: 111/81 126/84  Pulse: 77 98  Resp:    Temp: (!) 97.4 F (36.3 C) (!) 97.4 F (36.3 C)  SpO2: 99% 98%     Intake/Output Summary (Last 24 hours) at 03/31/2018 0840 Last data filed at 03/31/2018 0400 Gross per 24 hour  Intake 1349.11 ml  Output 925 ml  Net 424.11 ml    GENERAL: Thin cachectic male patient resting in the bed.  No acute distress.  Currently on 5 LN.  EYES: no pallor or icterus OROPHARYNX: no thrush or ulceration. NECK: supple, no masses felt LYMPH:  no palpable lymphadenopathy in the cervical, axillary or inguinal regions LUNGS: decreased breath sounds to auscultation R>L.   No wheeze or crackles HEART/CVS: regular rate & rhythm and no murmurs; No lower extremity edema ABDOMEN: abdomen soft, non-tender and normal bowel sounds Musculoskeletal:no cyanosis of digits and no clubbing  PSYCH: alert & oriented x 3 with fluent speech NEURO: no focal motor/sensory deficits SKIN:  no rashes or significant lesions   Labs:  Lab Results  Component Value Date   WBC 8.2 03/29/2018   HGB 9.6 (L) 03/29/2018   HCT 30.0 (L) 03/29/2018   MCV 102.0 (H) 03/29/2018   PLT 214 03/29/2018   NEUTROABS 6.3 03/24/2018    Lab Results  Component Value Date   NA 137 03/30/2018   K 4.5 03/30/2018   CL 106 03/30/2018   CO2 22 03/30/2018    Studies:  Dg Chest 2 View  Result Date: 03/30/2018 CLINICAL DATA:  Fever and shortness of breath for the past 2 days with clinical pneumonia. 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 EXAM: CHEST - 2 VIEW COMPARISON:   Portable chest x-ray of March 27, 2018 FINDINGS: The lungs are adequately inflated. There has been further interval increase in the interstitial infiltrates in the left lung. There is subtle increased density in the right upper lobe which is stable. There is no mediastinal shift. The heart and pulmonary vascularity are normal. The bony thorax exhibits no acute abnormality. IMPRESSION: Slightly increased interstitial densities throughout the left lung worrisome for pneumonia. Fairly stable density in the right pulmonary apex. No no overt CHF. Electronically Signed   By: David  Martinique M.D.   On: 03/30/2018 09:03   Ct Head Wo Contrast  Result Date: 03/30/2018 CLINICAL DATA:  In cephalopathy. History of metastatic lung cancer. EXAM: CT HEAD WITHOUT CONTRAST TECHNIQUE: Contiguous axial images were obtained from the base of the skull through the vertex without intravenous contrast. COMPARISON:  Brain MRI 02/24/2018 FINDINGS: Brain: Small focus of low density in the medial right occipital lobe corresponds to a previously treated metastasis. No intracranial mass effect, hemorrhage, acute infarct, or extra-axial fluid collection is identified. Mild cerebral atrophy is within normal limits for age. Vascular: Calcified atherosclerosis at the skull base. No hyperdense vessel. Skull: No fracture or focal osseous lesion. Sinuses/Orbits: Visualized paranasal sinuses and mastoid air cells are clear. Visualized orbits are unremarkable. Other: None. IMPRESSION: No evidence of acute intracranial abnormality. Electronically Signed   By: Seymour Bars.D.  On: 03/30/2018 11:01    Assessment & Plan:   # 62 year old male patient history of metastatic adenocarcinoma the lung currently status post 4 cycles of chemo immunotherapy is admitted the hospital for worsening shortness of breath  # Acute respiratory failure needing oxygen-bilateral groundglass infiltrates left more than right-suspicious of inflammatory pneumonitis  secondary to immunotherapy/possible infectious pneumonia [less likely lymphangitic spread].  Given suboptimal response to high-dose steroids; patient received infliximab 5 mg/kg dose on November 13th.  Monitor closely; based on clinical condition-could recommend a repeat dose tomorrow [November 15].  Recommend change the steroids to 80 mg once a day.  Continue antibiotics as per ID.  Appreciate ID/pulmonary recommendations.  # Metastatic adenocarcinoma the lung-currently status post 4 cycles of carbotaxol-atezo+bev-CT scan shows STABLE Lung ca; bilateral infiltrates/groundglass [differential diagnosis includes lymphangitic spread].  Monitor closely.  #Agitation/anxiety secondary to delirium-second steroids/polypharmacy-stable.  Await clinical improvement since changing the steroids schedule/dose.  Appreciate palliative care accommodations.  #DVT/GI prophylaxis  #The above plan of care was discussed with the patient.  I left a message for patient's wife with an update.   Cammie Sickle, MD 03/31/2018  8:40 AM

## 2018-04-01 DIAGNOSIS — C782 Secondary malignant neoplasm of pleura: Secondary | ICD-10-CM

## 2018-04-01 DIAGNOSIS — J449 Chronic obstructive pulmonary disease, unspecified: Secondary | ICD-10-CM

## 2018-04-01 DIAGNOSIS — Z79899 Other long term (current) drug therapy: Secondary | ICD-10-CM

## 2018-04-01 LAB — CBC
HCT: 34.3 % — ABNORMAL LOW (ref 39.0–52.0)
Hemoglobin: 11.3 g/dL — ABNORMAL LOW (ref 13.0–17.0)
MCH: 32.9 pg (ref 26.0–34.0)
MCHC: 32.9 g/dL (ref 30.0–36.0)
MCV: 100 fL (ref 80.0–100.0)
NRBC: 0 % (ref 0.0–0.2)
PLATELETS: 228 10*3/uL (ref 150–400)
RBC: 3.43 MIL/uL — ABNORMAL LOW (ref 4.22–5.81)
RDW: 14.6 % (ref 11.5–15.5)
WBC: 7.6 10*3/uL (ref 4.0–10.5)

## 2018-04-01 MED ORDER — METHYLPREDNISOLONE SODIUM SUCC 125 MG IJ SOLR
60.0000 mg | Freq: Every day | INTRAMUSCULAR | Status: DC
  Administered 2018-04-01 – 2018-04-03 (×3): 60 mg via INTRAVENOUS
  Filled 2018-04-01 (×3): qty 2

## 2018-04-01 MED ORDER — SODIUM CHLORIDE 0.9 % IV SOLN
5.0000 mg/kg | Freq: Once | INTRAVENOUS | Status: AC
  Administered 2018-04-01: 16:00:00 300 mg via INTRAVENOUS
  Filled 2018-04-01: qty 30

## 2018-04-01 NOTE — Progress Notes (Signed)
Continued gradual improvement.  No new complaints  Vitals:   03/31/18 1718 03/31/18 1836 03/31/18 1912 04/01/18 0417  BP: (!) 143/90 (!) 142/96 (!) 145/97 (!) 131/92  Pulse: 90 96 94 76  Resp:   16 18  Temp:  (!) 97.4 F (36.3 C) (!) 97.5 F (36.4 C) (!) 97.4 F (36.3 C)  TempSrc:  Oral Oral Oral  SpO2: 95% 92% 96% 98%  Weight:      Height:      3 LPM   Gen: NAD at rest HEENT: NCAT, sclera white Neck: No JVD Lungs: Bibasilar crackles -perhaps slightly improved, no wheezes Cardiovascular: RRR, no murmurs Abdomen: Soft, nontender, normal BS Ext: without clubbing, cyanosis, edema Neuro: grossly intact Skin: Limited exam, no lesions noted   BMP Latest Ref Rng & Units 03/31/2018 03/30/2018 03/28/2018  Glucose 70 - 99 mg/dL - 129(H) -  BUN 8 - 23 mg/dL - 23 -  Creatinine 0.61 - 1.24 mg/dL 0.63 0.62 0.64  BUN/Creat Ratio 10 - 24 - - -  Sodium 135 - 145 mmol/L - 137 -  Potassium 3.5 - 5.1 mmol/L - 4.5 -  Chloride 98 - 111 mmol/L - 106 -  CO2 22 - 32 mmol/L - 22 -  Calcium 8.9 - 10.3 mg/dL - 7.8(L) -   CBC Latest Ref Rng & Units 04/01/2018 03/29/2018 03/26/2018  WBC 4.0 - 10.5 K/uL 7.6 8.2 14.6(H)  Hemoglobin 13.0 - 17.0 g/dL 11.3(L) 9.6(L) 10.0(L)  Hematocrit 39.0 - 52.0 % 34.3(L) 30.0(L) 30.1(L)  Platelets 150 - 400 K/uL 228 214 242   ESR 106 mm/hr BNP 107 LDH 179 PCT < 0.10 Fungitell normal   CXR: No new film   IMPRESSION: Moderate COPD Acute hypoxemic respiratory failure Pneumonitis, likely chemotherapy-induced  No measurable response to corticosteroids Second dose of Remicade ordered for today  PLAN/REC Agree with infliximab as ordered by Dr. Rogue Bussing Would taper systemic steroids to off over next 7-10 days Continue supplemental oxygen at lowest flow necessary to maintain SPO2 >90% Continue Symbicort inhaler Repeat CXR ordered for tomorrow a.m. He appears ready for transfer to SNF/rehab facility At this point, PCCM will sign off. Please call if we  can be of further assistance    Merton Border, MD PCCM service Mobile [phone] Pager [phone] 04/01/2018 11:51 AM

## 2018-04-01 NOTE — Progress Notes (Signed)
[facility]  [phone] [phone]   Name: James [name] Date: 04/01/2018 MRN: [id number]  DOB: 11/02/1955  Patient Care Team: Valerie Roys, DO as PCP - General (Family Medicine) Telford Nab, RN as Registered Nurse Rogue Bussing, Elisha Headland, MD as Medical Oncologist (Medical Oncology)    REASON FOR CONSULTATION: Palliative Care consult requested for this 62 y.o. male with multiple medical problems including stage IV lung cancer last treated in October with chemotherapy/immunotherapy, history of tobacco abuse, and COPD, who was recently hospitalized 03/04/2018 to 03/07/2018 with shortness of breath, nausea and vomiting after receiving chemotherapy.  He was treated empirically for possible pneumonia.  Admitted on 03/24/2018 with fevers and hypoxic respiratory failure.  Chest CT revealed bilateral areas of groundglass appearance consistent with multifocal infection or inflammation.  Patient has been treated empirically for possible pneumonia versus pneumonitis from the immunotherapy.  Patient's hospitalization has been further complicated by persistent shortness of breath and delirium.  Palliative care was consulted to help manage symptoms and address goals.   CODE STATUS: DNR  PAST MEDICAL HISTORY: Past Medical History:  Diagnosis Date  . Cancer ([place]) 12/01/2017   Primary cancer of right upper lobe of lung. Mets to liver, brain.  Marland Kitchen Prepatellar bursitis of left knee    patient denies    PAST SURGICAL HISTORY:  Past Surgical History:  Procedure Laterality Date  . collapsed lung  2012   "had to reinflate" " i carried large piece of sheet rock up stairs by myself "   . [place]   x2 , second was with mesh   . TOTAL HIP ARTHROPLASTY Right 04/09/2017   Procedure: RIGHT TOTAL HIP ARTHROPLASTY ANTERIOR APPROACH;  Surgeon: Mcarthur Rossetti, MD;  Location: [facility];  Service: Orthopedics;  Laterality:  Right;  . URETHRAL STRICTURE DILATATION      HEMATOLOGY/ONCOLOGY HISTORY:  Oncology History   # July 2019- [facility]- RUL [s/p Liver Bx]-CT chest- RUL/ liver/ brain solitary lesion.  to contralateral lung; pleura; liver and brain.July 2019- A/P- liver metastases at least 2; bone scan shows right humeral; left fifth rib; L2 vertebral body metastases.  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  --------------------------------------------------------------------   DIAGNOSIS: Adenocarcinoma lung  STAGE: 4       ;GOALS: Palliative  CURRENT/MOST RECENT THERAPY -CARBO+TAXOL+ATEZO+AVASTIN      Primary cancer of right upper lobe of lung ([place])   11/24/2017 -  Chemotherapy    The patient had bevacizumab (AVASTIN) 1,000 mg in sodium chloride 0.9 % 100 mL chemo infusion, 15 mg/kg = 1,000 mg, Intravenous,  Once, 4 of 4 cycles Administration: 1,000 mg (12/16/2017), 1,000 mg (01/19/2018), 1,000 mg (02/09/2018), 1,000 mg (03/03/2018)  for chemotherapy treatment.     12/09/2017 -  Chemotherapy    The patient had palonosetron (ALOXI) injection 0.25 mg, 0.25 mg, Intravenous,  Once, 4 of 4 cycles Administration: 0.25 mg (12/16/2017), 0.25 mg (01/19/2018), 0.25 mg (02/09/2018), 0.25 mg (03/03/2018) CARBOplatin (PARAPLATIN) 670 mg in sodium chloride 0.9 % 250 mL chemo infusion, 670 mg (100 % of original dose 667.8 mg), Intravenous,  Once, 4 of 4 cycles Dose modification:   (original dose 667.8 mg, Cycle 1, Reason: Provider Judgment) Administration: 670 mg (12/16/2017), 610 mg (01/19/2018), 610 mg (02/09/2018) PACLitaxel (TAXOL) 360 mg in sodium chloride 0.9 % 500 mL chemo infusion (> 80mg /m2), 200 mg/m2 = 360 mg, Intravenous,  Once, 4 of 4 cycles Administration: 360 mg (12/16/2017), 360 mg (01/19/2018), 360 mg (02/09/2018), 360 mg (03/03/2018) atezolizumab (TECENTRIQ) 1,200 mg in sodium chloride 0.9 % 250 mL chemo infusion,  1,200 mg, Intravenous, Once, 4 of 4 cycles Administration: 1,200 mg (12/16/2017), 1,200 mg (01/19/2018), 1,200 mg (02/09/2018), 1,200 mg (03/03/2018)  for chemotherapy treatment.      ALLERGIES:  has No Known Allergies.  MEDICATIONS:  Current Facility-Administered Medications  Medication Dose Route Frequency Provider Last Rate Last Dose  . 0.9 %  sodium chloride infusion   Intravenous PRN Epifanio Lesches, MD 10 mL/hr at 03/30/18 0514    . acetaminophen (TYLENOL) tablet 650 mg  650 mg Oral Q6H PRN Hillary Bow, MD   650 mg at 03/26/18 1618   Or  . acetaminophen (TYLENOL) suppository 650 mg  650 mg Rectal Q6H PRN Sudini, Alveta Heimlich, MD      . benzonatate (TESSALON) capsule 200 mg  200 mg Oral TID PRN Hillary Bow, MD   200 mg at 03/28/18 2150  . diltiazem (CARDIZEM) tablet 30 mg  30 mg Oral Q8H Epifanio Lesches, MD   30 mg at 04/01/18 1440  . enoxaparin (LOVENOX) injection 40 mg  40 mg Subcutaneous Q2200 Hillary Bow, MD   40 mg at 03/31/18 2104  . feeding supplement (BOOST / RESOURCE BREEZE) liquid 1 Container  1 Container Oral TID BM Epifanio Lesches, MD   1 Container at 04/01/18 1441  . feeding supplement (NEPRO CARB STEADY) liquid 237 mL  237 mL Oral BID BM Epifanio Lesches, MD      . guaiFENesin (MUCINEX) 12 hr tablet 600 mg  600 mg Oral BID Loney Hering D, MD   600 mg at 04/01/18 0948  . inFLIXimab (REMICADE) 5 mg/kg = 300 mg in sodium chloride 0.9 % 250 mL infusion  5 mg/kg Intravenous Once Charlaine Dalton R, MD      . levalbuterol Penne Lash) nebulizer solution 0.63 mg  0.63 mg Nebulization Q6H PRN Wilhelmina Mcardle, MD      . LORazepam (ATIVAN) injection 0.5 mg  0.5 mg Intravenous Once Gladstone Lighter, MD      . methylPREDNISolone sodium succinate (SOLU-MEDROL) 125 mg/2 mL injection 60 mg  60 mg Intravenous Daily Cammie Sickle, MD   60 mg at 04/01/18 0949  . metoprolol tartrate (LOPRESSOR) injection 5 mg  5 mg Intravenous Q4H PRN Dustin Flock, MD   5 mg  at 03/29/18 0019  . metoprolol tartrate (LOPRESSOR) tablet 50 mg  50 mg Oral BID Epifanio Lesches, MD   50 mg at 04/01/18 0948  . mometasone-formoterol (DULERA) 100-5 MCG/ACT inhaler 2 puff  2 puff Inhalation BID Hillary Bow, MD   2 puff at 04/01/18 0949  . morphine 2 MG/ML injection 1-2 mg  1-2 mg Intravenous Q2H PRN Cammie Sickle, MD   2 mg at 03/31/18 1513  . multivitamin with minerals tablet 1 tablet  1 tablet Oral Daily Epifanio Lesches, MD   1 tablet at 04/01/18 0948  . ondansetron (ZOFRAN) tablet 4 mg  4 mg Oral Q6H PRN Hillary Bow, MD       Or  . ondansetron (ZOFRAN) injection 4 mg  4 mg Intravenous Q6H PRN Hillary Bow, MD   4 mg at 03/24/18 1736  . pantoprazole (PROTONIX) EC tablet 20 mg  20 mg Oral BID Cammie Sickle, MD   20 mg at 04/01/18 0948  . polyethylene glycol (MIRALAX / GLYCOLAX) packet 17 g  17 g Oral Daily PRN Sudini,  Srikar, MD      . risperiDONE (RISPERDAL M-TABS) disintegrating tablet 0.5 mg  0.5 mg Oral QHS Epifanio Lesches, MD   0.5 mg at 03/31/18 2100  . senna (SENOKOT) tablet 8.6 mg  1 tablet Oral Daily PRN [place], Kirt Boys, NP   8.6 mg at 03/31/18 1723  . sodium chloride flush (NS) 0.9 % injection 3 mL  3 mL Intravenous Q12H Hillary Bow, MD   3 mL at 04/01/18 0951  . sulfamethoxazole-trimethoprim (BACTRIM DS,SEPTRA DS) 800-160 MG per tablet 1 tablet  1 tablet Oral Q24H Tsosie Billing, MD   1 tablet at 04/01/18 0948  . traZODone (DESYREL) tablet 50 mg  50 mg Oral QHS PRN Lance Coon, MD   50 mg at 03/31/18 2141    VITAL SIGNS: BP (!) 131/92 (BP Location: Left Arm)   Pulse 76   Temp (!) 97.4 F (36.3 C) (Oral)   Resp 18   Ht 5\' 10"  (1.778 m)   Wt 133 lb 3.2 oz (60.4 kg)   SpO2 98%   BMI 19.11 kg/m  Filed Weights   03/29/18 0500 03/30/18 0419 03/31/18 0410  Weight: 140 lb 14.4 oz (63.9 kg) 142 lb 6.7 oz (64.6 kg) 133 lb 3.2 oz (60.4 kg)    Estimated body mass index is 19.11 kg/m as calculated from the  following:   Height as of this encounter: 5\' 10"  (1.778 m).   Weight as of this encounter: 133 lb 3.2 oz (60.4 kg).  LABS: CBC:    Component Value Date/Time   WBC 7.6 04/01/2018 0457   HGB 11.3 (L) 04/01/2018 0457   HGB 13.3 08/24/2017 1651   HCT 34.3 (L) 04/01/2018 0457   HCT 40.5 08/24/2017 1651   PLT 228 04/01/2018 0457   PLT 268 08/24/2017 1651   MCV 100.0 04/01/2018 0457   MCV 99 (H) 08/24/2017 1651   MCV 96 04/23/2013 1330   NEUTROABS 6.3 03/24/2018 1046   NEUTROABS 3.8 08/24/2017 1651   LYMPHSABS 0.6 (L) 03/24/2018 1046   LYMPHSABS 1.5 08/24/2017 1651   MONOABS 1.0 03/24/2018 1046   EOSABS 0.1 03/24/2018 1046   EOSABS 0.3 08/24/2017 1651   BASOSABS 0.0 03/24/2018 1046   BASOSABS 0.0 08/24/2017 1651   Comprehensive Metabolic Panel:    Component Value Date/Time   NA 137 03/30/2018 1016   NA 138 08/24/2017 1651   NA 137 04/23/2013 1330   K 4.5 03/30/2018 1016   K 3.8 04/23/2013 1330   CL 106 03/30/2018 1016   CL 105 04/23/2013 1330   CO2 22 03/30/2018 1016   CO2 22 04/23/2013 1330   BUN 23 03/30/2018 1016   BUN 18 08/24/2017 1651   BUN 18 04/23/2013 1330   CREATININE 0.63 03/31/2018 0532   CREATININE 0.90 04/23/2013 1330   GLUCOSE 129 (H) 03/30/2018 1016   GLUCOSE 120 (H) 04/23/2013 1330   CALCIUM 7.8 (L) 03/30/2018 1016   CALCIUM 9.3 04/23/2013 1330   AST 42 (H) 03/24/2018 1046   AST 11 (L) 04/23/2013 1330   ALT 43 03/24/2018 1046   ALT 18 04/23/2013 1330   ALKPHOS 63 03/24/2018 1046   ALKPHOS 64 04/23/2013 1330   BILITOT 0.8 03/24/2018 1046   BILITOT 0.2 08/24/2017 1651   BILITOT 0.6 04/23/2013 1330   PROT 7.2 03/24/2018 1046   PROT 6.8 08/24/2017 1651   PROT 7.5 04/23/2013 1330   ALBUMIN 3.0 (L) 03/24/2018 1046   ALBUMIN 4.2 08/24/2017 1651   ALBUMIN 3.8 04/23/2013 1330  RADIOGRAPHIC STUDIES: Dg Chest 2 View  Result Date: 03/30/2018 CLINICAL DATA:  Fever and shortness of breath for the past 2 days with clinical pneumonia. History of stage  IV lung malignancy metastatic to the brain and liver. EXAM: CHEST - 2 VIEW COMPARISON:  Portable chest x-ray of March 27, 2018 FINDINGS: The lungs are adequately inflated. There has been further interval increase in the interstitial infiltrates in the left lung. There is subtle increased density in the right upper lobe which is stable. There is no mediastinal shift. The heart and pulmonary vascularity are normal. The bony thorax exhibits no acute abnormality. IMPRESSION: Slightly increased interstitial densities throughout the left lung worrisome for pneumonia. Fairly stable density in the right pulmonary apex. No no overt CHF. Electronically Signed   By: David  Martinique M.D.   On: 03/30/2018 09:03   Dg Chest 2 View  Result Date: 03/24/2018 CLINICAL DATA:  Short of breath, cough, history of lung carcinoma with metastasis to the liver and brain EXAM: CHEST - 2 VIEW COMPARISON:  Portable chest x-ray of 03/05/2018 and CT chest of 02/04/2018 FINDINGS: There is more opacity now present within the left mid and lower lung suspicious for developing pneumonia. Somewhat coarse and prominent markings remain bilaterally as well. Mediastinal and hilar contours are unchanged and heart size is stable. The sclerotic thoracic vertebral body lesion is not well seen by plain film. IMPRESSION: 1. Increasing markings in the left upper and lower lung suspicious for developing pneumonia. 2. Stable chronic fibrotic change as well. Electronically Signed   By: Ivar Drape M.D.   On: 03/24/2018 11:24   Dg Chest 2 View  Result Date: 03/05/2018 CLINICAL DATA:  Fever EXAM: CHEST - 2 VIEW COMPARISON:  CT 02/04/2018, radiograph 11/01/2017 FINDINGS: No pleural effusion. Emphysematous disease. Coarse chronic interstitial opacity. Increased left upper and peripheral nodular opacity. Decreased nodularity in the right upper lobe. Normal heart size. No pneumothorax. IMPRESSION: 1. Increased peripheral mid to upper lung opacity, slightly  nodular in appearance, possible acute infection or inflammatory process. Lung nodules in this patient with history of lung cancer are also considered. 2. Underlying emphysematous disease and coarse interstitial disease. Slight decrease nodularity in the right upper lobe since prior radiograph. Electronically Signed   By: Donavan Foil M.D.   On: 03/05/2018 00:16   Ct Head Wo Contrast  Result Date: 03/30/2018 CLINICAL DATA:  In cephalopathy. History of metastatic lung cancer. EXAM: CT HEAD WITHOUT CONTRAST TECHNIQUE: Contiguous axial images were obtained from the base of the skull through the vertex without intravenous contrast. COMPARISON:  Brain MRI 02/24/2018 FINDINGS: Brain: Small focus of low density in the medial right occipital lobe corresponds to a previously treated metastasis. No intracranial mass effect, hemorrhage, acute infarct, or extra-axial fluid collection is identified. Mild cerebral atrophy is within normal limits for age. Vascular: Calcified atherosclerosis at the skull base. No hyperdense vessel. Skull: No fracture or focal osseous lesion. Sinuses/Orbits: Visualized paranasal sinuses and mastoid air cells are clear. Visualized orbits are unremarkable. Other: None. IMPRESSION: No evidence of acute intracranial abnormality. Electronically Signed   By: Logan Bores M.D.   On: 03/30/2018 11:01   Ct Angio Chest Pe W Or Wo Contrast  Result Date: 03/24/2018 CLINICAL DATA:  Pt sent from the [facility] with c/o chest pain for the past couple of days with fever. States O2 sats were low and placed pt on 2L [place] PTA. Patient with lung carcinoma. Was on chemotherapy at the time of the CT dated 02/04/2018.  EXAM: CT ANGIOGRAPHY CHEST WITH CONTRAST TECHNIQUE: Multidetector CT imaging of the chest was performed using the standard protocol during bolus administration of intravenous contrast. Multiplanar CT image reconstructions and MIPs were obtained to evaluate the vascular anatomy. CONTRAST:  13mL  ISOVUE-370 IOPAMIDOL (ISOVUE-370) INJECTION 76% COMPARISON:  Current chest radiograph, and chest CTs 11/05/2017 and 02/04/2018. FINDINGS: Cardiovascular: There is satisfactory opacification of the pulmonary arteries to the segmental level. There is no evidence of a pulmonary embolism. Heart is normal in size. No pericardial effusion. Minor left coronary artery calcifications. Aorta is normal in caliber. No dissection. Minimal distal descending thoracic atherosclerosis. Mild atherosclerosis at the origin of the arch branch vessels without significant stenosis. Mediastinum/Nodes: Mildly enlarged, shotty left para carinal lymph node measuring 12 mm short axis, 16 mm on the exam dated 02/04/2018. Right subcarinal node measuring 13 mm in short axis, previously 13 mm. No new prominent or enlarged lymph nodes. No mediastinal masses. No neck base or axillary masses or enlarged lymph nodes. Soft tissue thickening along the right hilar structures is unchanged from the most recent prior study. No discrete hilar masses or enlarged lymph nodes. Lungs/Pleura: There are new areas of bilateral airspace disease. Ground-glass opacities and patchy peribronchovascular consolidation is noted in the left upper lobe. Ground-glass opacity with dependent consolidation is noted in the posterior left lower lobe extending to the lung base. There is left lower lobe peribronchovascular consolidation, most confluent at posterior lung base. Bilateral peribronchial thickening is noted similar to the most recent prior study. Other findings seen previously including soft tissue thickening along the right upper lobe bronchus and areas of nodular opacity are stable from the most recent prior exam consistent with the patient's known carcinoma. There is stable interstitial thickening most evident in the lower lungs. Trace left pleural effusion.  No pneumothorax. Upper Abdomen: No acute abnormality. Musculoskeletal: Stable 13 mm colonic lesion in T4. No  new bone lesions. No fracture or acute finding. Review of the MIP images confirms the above findings. IMPRESSION: 1. No evidence of a pulmonary embolism. 2. Bilateral areas of ground-glass and more confluent lung opacity consistent with multifocal infection or inflammation, new since the prior chest, abdomen and pelvis CT from 02/04/2018. 3. Findings of lung carcinoma described previously are without significant change. Aortic Atherosclerosis (ICD10-I70.0) and Emphysema (ICD10-J43.9). Electronically Signed   By: Lajean Manes M.D.   On: 03/24/2018 13:11   Dg Chest Port 1 View  Result Date: 03/27/2018 CLINICAL DATA:  Patient with cough. EXAM: PORTABLE CHEST 1 VIEW COMPARISON:  Chest radiograph 03/24/2018 FINDINGS: Stable cardiac and mediastinal contours. Similar-appearing left lung coarse interstitial opacities. Unchanged heterogeneous opacities right lung base. No pleural effusion or pneumothorax. IMPRESSION: 1. Similar-appearing left lung coarse interstitial opacities which may represent infection. Followup PA and lateral chest X-ray is recommended in 3-4 weeks following trial of antibiotic therapy to ensure resolution and exclude underlying malignancy. 2. Unchanged heterogeneous opacities right lung base which may represent atelectasis or infection. Electronically Signed   By: Lovey Newcomer M.D.   On: 03/27/2018 10:23   Dg Chest Port 1 View  Result Date: 03/05/2018 CLINICAL DATA:  Cough, metastatic lung cancer EXAM: PORTABLE CHEST 1 VIEW COMPARISON:  Chest radiograph from one day prior. FINDINGS: Stable cardiomediastinal silhouette with normal heart size. No pneumothorax. No pleural effusion. Extensive patchy reticulonodular opacities throughout both lungs have not appreciably changed. No acute superimposed consolidative airspace disease. IMPRESSION: Stable extensive patchy reticulonodular opacities throughout both lungs, favor lymphangitic tumor. No acute superimposed consolidative airspace disease.  Electronically Signed   By: Ilona Sorrel M.D.   On: 03/05/2018 21:59    PERFORMANCE STATUS (ECOG) : 3 - Symptomatic, >50% confined to bed  Review of Systems As noted above. Otherwise, a complete review of systems is negative.  Physical Exam General: NAD, frail appearing, thin Cardiovascular: regular rate and rhythm Pulmonary: improved aeration today Abdomen: soft, nontender, + bowel sounds Extremities: no edema, no joint deformities Skin: no rashes Neurological: Weakness but otherwise nonfocal  IMPRESSION: Spoke with patient and wife. Patient worked today with PT. He seems to be slowly improving. Has less dyspnea and hallucinations today. Oral intake is improved.   Both patient and wife want to pursue rehab. Case discussed with SW. Patient has bed at [facility]. Would recommend that she be followed at rehab by palliative care. Will also continue to follow in the clinic.   PLAN: Continue morphine prn for comfort Continue risperidone nightly Plan for rehab Would recommend palliative care follow at rehab Will also follow in the clinic   Time Total: 25 minutes  Visit consisted of counseling and education dealing with the complex and emotionally intense issues of symptom management and palliative care in the setting of serious and potentially life-threatening illness.Greater than 50%  of this time was spent counseling and coordinating care related to the above assessment and plan.  Signed by: Altha Harm, PhD, DNP, NP-C, [facility] [phone] (Work Cell)

## 2018-04-01 NOTE — NC FL2 (Signed)
[place] LEVEL OF CARE SCREENING TOOL     IDENTIFICATION  Patient Name: James [name] Birthdate: 03/02/1956 Sex: male Admission Date (Current Location): 03/24/2018  [place] and [place] Number:  Engineering geologist and Address:  [facility], [address] [place], [place] [address]      Provider Number: [id number]  Attending Physician Name and Address:  Gorden Harms, MD  Relative Name and Phone Number:  Piotr Christopher- wife [phone]    Current Level of Care: Hospital Recommended Level of Care: [place] Prior Approval Number:    Date Approved/Denied:   PASRR Number: [id number]  Discharge Plan: SNF    Current Diagnoses: Patient Active Problem List   Diagnosis Date Noted  . Drug-induced pneumonitis   . Palliative care encounter   . Pneumonia 03/24/2018  . Malnutrition of moderate degree 03/07/2018  . Sepsis ([place]) 03/05/2018  . HCAP (healthcare-associated pneumonia) 03/05/2018  . Cancer, metastatic to bone ([place]) 01/18/2018  . Goals of care, counseling/discussion 12/10/2017  . Primary cancer of right upper lobe of lung ([place]) 11/23/2017  . Mass of upper lobe of right lung 11/08/2017  . Status post total replacement of right hip 04/09/2017  . Unilateral primary osteoarthritis, right hip 02/09/2017  . Chronic right-sided low back pain with left-sided sciatica 02/09/2017  . Pain in right hip 02/09/2017    Orientation RESPIRATION BLADDER Height & Weight     Self, Time, Place  O2(3 liters ) Continent Weight: 133 lb 3.2 oz (60.4 kg) Height:  5\' 10"  (177.8 cm)  BEHAVIORAL SYMPTOMS/MOOD NEUROLOGICAL BOWEL NUTRITION STATUS  (none) (none) Continent Diet(Regular )  AMBULATORY STATUS COMMUNICATION OF NEEDS Skin   Extensive Assist Verbally Normal                       Personal Care Assistance Level of Assistance  Bathing, Feeding, Dressing Bathing Assistance: Limited assistance Feeding assistance:  Independent Dressing Assistance: Limited assistance     Functional Limitations Info  Sight, Hearing, Speech Sight Info: Adequate Hearing Info: Adequate Speech Info: Adequate    SPECIAL CARE FACTORS FREQUENCY  PT (By licensed PT), OT (By licensed OT)     PT Frequency: 5 OT Frequency: 5            Contractures Contractures Info: Not present    Additional Factors Info  Code Status, Allergies Code Status Info: DNR Allergies Info: NKA           Current Medications (04/01/2018):  This is the current hospital active medication list Current Facility-Administered Medications  Medication Dose Route Frequency Provider Last Rate Last Dose  . 0.9 %  sodium chloride infusion   Intravenous PRN Epifanio Lesches, MD 10 mL/hr at 03/30/18 0514    . acetaminophen (TYLENOL) tablet 650 mg  650 mg Oral Q6H PRN Hillary Bow, MD   650 mg at 03/26/18 1618   Or  . acetaminophen (TYLENOL) suppository 650 mg  650 mg Rectal Q6H PRN Sudini, Alveta Heimlich, MD      . benzonatate (TESSALON) capsule 200 mg  200 mg Oral TID PRN Hillary Bow, MD   200 mg at 03/28/18 2150  . diltiazem (CARDIZEM) tablet 30 mg  30 mg Oral Q8H Epifanio Lesches, MD   30 mg at 04/01/18 0528  . enoxaparin (LOVENOX) injection 40 mg  40 mg Subcutaneous Q2200 Hillary Bow, MD   40 mg at 03/31/18 2104  . feeding supplement (BOOST / RESOURCE BREEZE) liquid 1 Container  1  Container Oral TID BM Epifanio Lesches, MD   1 Container at 04/01/18 [phone]  . feeding supplement (NEPRO CARB STEADY) liquid 237 mL  237 mL Oral BID BM Epifanio Lesches, MD      . guaiFENesin (MUCINEX) 12 hr tablet 600 mg  600 mg Oral BID Loney Hering D, MD   600 mg at 04/01/18 0948  . inFLIXimab (REMICADE) 5 mg/kg = 300 mg in sodium chloride 0.9 % 250 mL infusion  5 mg/kg Intravenous Once Charlaine Dalton R, MD      . levalbuterol Penne Lash) nebulizer solution 0.63 mg  0.63 mg Nebulization Q6H PRN Wilhelmina Mcardle, MD      . LORazepam (ATIVAN)  injection 0.5 mg  0.5 mg Intravenous Once Gladstone Lighter, MD      . methylPREDNISolone sodium succinate (SOLU-MEDROL) 125 mg/2 mL injection 60 mg  60 mg Intravenous Daily Cammie Sickle, MD   60 mg at 04/01/18 0949  . metoprolol tartrate (LOPRESSOR) injection 5 mg  5 mg Intravenous Q4H PRN Dustin Flock, MD   5 mg at 03/29/18 0019  . metoprolol tartrate (LOPRESSOR) tablet 50 mg  50 mg Oral BID Epifanio Lesches, MD   50 mg at 04/01/18 0948  . mometasone-formoterol (DULERA) 100-5 MCG/ACT inhaler 2 puff  2 puff Inhalation BID Hillary Bow, MD   2 puff at 04/01/18 0949  . morphine 2 MG/ML injection 1-2 mg  1-2 mg Intravenous Q2H PRN Cammie Sickle, MD   2 mg at 03/31/18 1513  . multivitamin with minerals tablet 1 tablet  1 tablet Oral Daily Epifanio Lesches, MD   1 tablet at 04/01/18 0948  . ondansetron (ZOFRAN) tablet 4 mg  4 mg Oral Q6H PRN Hillary Bow, MD       Or  . ondansetron (ZOFRAN) injection 4 mg  4 mg Intravenous Q6H PRN Hillary Bow, MD   4 mg at 03/24/18 1736  . pantoprazole (PROTONIX) EC tablet 20 mg  20 mg Oral BID Cammie Sickle, MD   20 mg at 04/01/18 0948  . polyethylene glycol (MIRALAX / GLYCOLAX) packet 17 g  17 g Oral Daily PRN Sudini, Alveta Heimlich, MD      . risperiDONE (RISPERDAL M-TABS) disintegrating tablet 0.5 mg  0.5 mg Oral QHS Epifanio Lesches, MD   0.5 mg at 03/31/18 2100  . senna (SENOKOT) tablet 8.6 mg  1 tablet Oral Daily PRN Borders, Kirt Boys, NP   8.6 mg at 03/31/18 1723  . sodium chloride flush (NS) 0.9 % injection 3 mL  3 mL Intravenous Q12H Hillary Bow, MD   3 mL at 04/01/18 0951  . sulfamethoxazole-trimethoprim (BACTRIM DS,SEPTRA DS) 800-160 MG per tablet 1 tablet  1 tablet Oral Q24H Tsosie Billing, MD   1 tablet at 04/01/18 0948  . traZODone (DESYREL) tablet 50 mg  50 mg Oral QHS PRN Lance Coon, MD   50 mg at 03/31/18 2141     Discharge Medications: Please see discharge summary for a list of discharge  medications.  Relevant Imaging Results:  Relevant Lab Results:   Additional Information SSN: [id number]  Annamaria Boots, [place]

## 2018-04-01 NOTE — Clinical Social Work Note (Signed)
CSW gave patient bed offers and he chose [facility]. CSW notified Tammy at [facility] of bed acceptance. CSW will continue to follow for discharge planning.   [place], [place]

## 2018-04-01 NOTE — Progress Notes (Signed)
James [name]   DOB:Dec 17, 1955   WV#:[id number]    Subjective: Breathing improved slightly.  Is currently on 3 L.[Previously 5 L].  He was able to sit in the chair yesterday.   Continues to have anxiety episodes; also needing morphine for air hunger/anxiety.  But no hallucinations.  Objective:  Vitals:   04/01/18 0417 04/01/18 1508  BP: (!) 131/92 (!) 154/97  Pulse: 76 99  Resp: 18 18  Temp: (!) 97.4 F (36.3 C) 97.6 F (36.4 C)  SpO2: 98% 93%     Intake/Output Summary (Last 24 hours) at 04/01/2018 1628 Last data filed at 04/01/2018 1441 Gross per 24 hour  Intake 50 ml  Output 2100 ml  Net -2050 ml    GENERAL: Thin cachectic male patient resting in the bed.  No acute distress.  Currently on 3 lits.-96%.  On room air 88 while resting EYES: no pallor or icterus OROPHARYNX: no thrush or ulceration. NECK: supple, no masses felt LYMPH:  no palpable lymphadenopathy in the cervical, axillary or inguinal regions LUNGS: decreased breath sounds to auscultation R>L.   No wheeze or crackles HEART/CVS: regular rate & rhythm and no murmurs; No lower extremity edema ABDOMEN: abdomen soft, non-tender and normal bowel sounds Musculoskeletal:no cyanosis of digits and no clubbing  PSYCH: alert & oriented x 3 with fluent speech NEURO: no focal motor/sensory deficits SKIN:  no rashes or significant lesions   Labs:  Lab Results  Component Value Date   WBC 7.6 04/01/2018   HGB 11.3 (L) 04/01/2018   HCT 34.3 (L) 04/01/2018   MCV 100.0 04/01/2018   PLT 228 04/01/2018   NEUTROABS 6.3 03/24/2018    Lab Results  Component Value Date   NA 137 03/30/2018   K 4.5 03/30/2018   CL 106 03/30/2018   CO2 22 03/30/2018    Studies:  No results found.  Assessment & Plan:   # 62 year old male patient history of metastatic adenocarcinoma the lung currently status post 4 cycles of chemo immunotherapy is admitted the hospital for worsening shortness of breath  # Acute respiratory failure needing  oxygen-bilateral groundglass infiltrates left more than right-suspicious of inflammatory pneumonitis secondary to immunotherapy/possible infectious pneumonia [less likely lymphangitic spread].  Status post infliximab 5 mg/kg on November 13; slight improvement noted.  Proceed with second dose of infliximab today [November 15]; and taper the steroids to 60 mg once a day Solu-Medrol.   # Metastatic adenocarcinoma the lung-currently status post 4 cycles of carbotaxol-atezo+bev-CT scan shows STABLE Lung ca; bilateral infiltrates/groundglass [differential diagnosis includes lymphangitic spread].  Monitor closely  #Agitation/anxiety secondary to delirium-second steroids/polypharmacy-improved.  #DVT/GI prophylaxis  #The above plan of care was discussed with the patient/wife over the phone.  Dr. Mike Gip is on call over the weekend.   Cammie Sickle, MD 04/01/2018  4:28 PM

## 2018-04-01 NOTE — Progress Notes (Addendum)
[facility] at [place] NAME: James [name]    MR#:  [id number]  DATE OF BIRTH:  1956-03-23  SUBJECTIVE:  CHIEF COMPLAINT:   Chief Complaint  Patient presents with  . Chest Pain  Patient feeling better, wife at the bedside, oncology/pulmonology/palliative care/infectious disease input appreciated  REVIEW OF SYSTEMS:  CONSTITUTIONAL: No fever, fatigue or weakness.  EYES: No blurred or double vision.  EARS, NOSE, AND THROAT: No tinnitus or ear pain.  RESPIRATORY: No cough, shortness of breath, wheezing or hemoptysis.  CARDIOVASCULAR: No chest pain, orthopnea, edema.  GASTROINTESTINAL: No nausea, vomiting, diarrhea or abdominal pain.  GENITOURINARY: No dysuria, hematuria.  ENDOCRINE: No polyuria, nocturia,  HEMATOLOGY: No anemia, easy bruising or bleeding SKIN: No rash or lesion. MUSCULOSKELETAL: No joint pain or arthritis.   NEUROLOGIC: No tingling, numbness, weakness.  PSYCHIATRY: No anxiety or depression.   ROS  DRUG ALLERGIES:  No Known Allergies  VITALS:  Blood pressure (!) 131/92, pulse 76, temperature (!) 97.4 F (36.3 C), temperature source Oral, resp. rate 18, height '5\' 10"'  (1.778 m), weight 60.4 kg, SpO2 98 %.  PHYSICAL EXAMINATION:  GENERAL:  62 y.o.-year-old patient lying in the bed with no acute distress.  EYES: Pupils equal, round, reactive to light and accommodation. No scleral icterus. Extraocular muscles intact.  HEENT: Head atraumatic, normocephalic. Oropharynx and nasopharynx clear.  NECK:  Supple, no jugular venous distention. No thyroid enlargement, no tenderness.  LUNGS: Normal breath sounds bilaterally, no wheezing, rales,rhonchi or crepitation. No use of accessory muscles of respiration.  CARDIOVASCULAR: S1, S2 normal. No murmurs, rubs, or gallops.  ABDOMEN: Soft, nontender, nondistended. Bowel sounds present. No organomegaly or mass.  EXTREMITIES: No pedal edema, cyanosis, or clubbing.  NEUROLOGIC: Cranial nerves  II through XII are intact. Muscle strength 5/5 in all extremities. Sensation intact. Gait not checked.  PSYCHIATRIC: The patient is alert and oriented x 3.  SKIN: No obvious rash, lesion, or ulcer.   Physical Exam LABORATORY PANEL:   CBC Recent Labs  Lab 04/01/18 0457  WBC 7.6  HGB 11.3*  HCT 34.3*  PLT 228   ------------------------------------------------------------------------------------------------------------------  Chemistries  Recent Labs  Lab 03/30/18 1016 03/31/18 0532  NA 137  --   K 4.5  --   CL 106  --   CO2 22  --   GLUCOSE 129*  --   BUN 23  --   CREATININE 0.62 0.63  CALCIUM 7.8*  --    ------------------------------------------------------------------------------------------------------------------  Cardiac Enzymes No results for input(s): TROPONINI in the last 168 hours. ------------------------------------------------------------------------------------------------------------------  RADIOLOGY:  No results found.  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  *Acute on chronic hypoxic respiratory failure Resolving Secondary to pneumonia, lung cancer-on chemo/radiation, currently on 3 L via nasal cannula  *Acute b/l HCAP Concerned for pneumonitis due to his immunomodulator therapy for lung cancer Is currently on cefepime/Bactrim -concern for opportunistic infection, infectious disease input appreciated Continue IV Solu-Medrol as recommended by Dr. Brahmanday/oncology Wean O2 off as tolerated, physical therapy following-inpatient rehab/skilled nurse facility status post discharge, mucolytic agents, pulmonary toilet with bronchodilator therapy  *Chronic lung cancer with brain met On chemo and radiation Per  Dr. Rogue Bussing - for another round of infliximab for possible inflammatory lung condition, antibiotics per above  *Acute  psychosis Resolved Continue Risperdal at bedtime  *Anemia of chronic disease Stable  *Chronic benign essential hypertension  Controlled on current regiment  *COPD Continue current regiment, pulmonology input greatly appreciated, wean O2 off as tolerated  *anxiety Stable on current regiment  *Acute dehydration  IV fluids for rehydration  Disposition to inpatient rehab/skilled nursing facility status post discharge once cleared by oncology - stable for discharge from medical standpoint on home oxygen Palliative care following  All the records are reviewed and case discussed with Care Management/Social Workerr. Management plans discussed with the patient, family and they are in agreement.  CODE STATUS: dnr  TOTAL TIME TAKING CARE OF THIS PATIENT: 45 minutes.     POSSIBLE D/C IN 1-3 DAYS, DEPENDING ON CLINICAL CONDITION.   Avel Peace  M.D on 04/01/2018   Between 7am to 6pm - Pager - [phone]  After 6pm go to [email or web address] - password [email or web address] [place]  Office  [phone]  CC: Primary care physician; Valerie Roys, DO  Note: This dictation was prepared with Dragon dictation along with smaller phrase technology. Any transcriptional errors that result from this process are unintentional.

## 2018-04-01 NOTE — Progress Notes (Signed)
James [name] is a 62 y.o. 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 He says after completion of 2 cycles of chemo he started feeling SOB. On 10/18 he was hospitalized with fever, sob, cough and was diagnosed as pneumonia and treated with antibiotics nebs, and was discharged on 10/21 on oral augmentin. He saw his oncologist as OP on 10/28. He saw NP on 11/7 with fever, sob, cough and was sent to ED. He had a low grade fever of 100.4 in the ED. O2 stas were low.CT chest showed ground glass opacity left upper lobe worse than rt side. He was started on vanco, cefepime and flagyl. After discusision with his oncologist Dr. Yevette Edwards concern for drug induced pneumonitis was raised and he was started on solumedrol. As he continued to be sob he ws seen by pulmonary who wanted to r/o opportunistic infections and asked me to see the patient. 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, no pets, lived in [place]  but exposure to coccidioides possible On talking to the Oncologist he had a prolonged course of PO steroids for the brain met in July /August  subjective Doing better Says breathing easy, was able to walk to the bathroom and did not feel sob like before  Objective:  VITALS:  BP (!) 131/92 (BP Location: Left Arm)   Pulse 76   Temp (!) 97.4 F (36.3 C) (Oral)   Resp 18   Ht '5\' 10"'$  (1.778 m)   Wt 60.4 kg   SpO2 98%   BMI 19.11 kg/m  PHYSICAL EXAM:  General: Alert,  cooperative, no  resp distress, thin Head: Normocephalic, without obvious abnormality, atraumatic. Eyes: Conjunctivae clear, anicteric sclerae. Pupils are equal ENT Nares normal. No drainage or sinus tenderness. Lips, mucosa, and tongue normal. No Thrush Neck: Supple, symmetrical, no adenopathy, thyroid: non tender no carotid bruit and no JVD. Back: No CVA tenderness. Lungs: b/l air entry- crepts both sides Left side of the chest prominent than the rt in the front Heart:s1s2 tachycardia Abdomen: Soft, non-tender,not distended. Bowel sounds normal. No masses Extremities: atraumatic, no cyanosis. No edema. No clubbing Skin: sun related changes- solar lentigenes Lymph: Cervical, supraclavicular normal. Neurologic: Grossly non-focal Pertinent Labs Lab Results CBC    Component Value Date/Time   WBC 7.6 04/01/2018 0457   RBC 3.43 (L) 04/01/2018 0457   HGB 11.3 (L) 04/01/2018 0457   HGB 13.3 08/24/2017 1651   HCT 34.3 (L) 04/01/2018 0457   HCT 40.5 08/24/2017 1651   PLT 228 04/01/2018 0457   PLT 268 08/24/2017 1651   MCV 100.0 04/01/2018 0457   MCV 99 (H) 08/24/2017 1651   MCV 96 04/23/2013 1330   MCH 32.9 04/01/2018 0457   MCHC 32.9 04/01/2018 0457   RDW 14.6 04/01/2018 0457   RDW 13.7 08/24/2017 1651   RDW 12.6 04/23/2013 1330   LYMPHSABS 0.6 (L) 03/24/2018 1046   LYMPHSABS 1.5 08/24/2017  1651   MONOABS 1.0 03/24/2018 1046   EOSABS 0.1 03/24/2018 1046   EOSABS 0.3 08/24/2017 1651   BASOSABS 0.0 03/24/2018 1046   BASOSABS 0.0 08/24/2017 1651    CMP Latest Ref Rng & Units 03/31/2018 03/30/2018 03/28/2018  Glucose 70 - 99 mg/dL - 129(H) -  BUN 8 - 23 mg/dL - 23 -  Creatinine 0.61 - 1.24 mg/dL 0.63 0.62 0.64  Sodium 135 - 145 mmol/L - 137 -  Potassium 3.5 - 5.1 mmol/L - 4.5 -  Chloride 98 - 111 mmol/L - 106 -  CO2 22 - 32 mmol/L - 22 -  Calcium 8.9 - 10.3 mg/dL - 7.8(L) -  Total Protein 6.5 - 8.1 g/dL - - -  Total Bilirubin 0.3 - 1.2 mg/dL - - -  Alkaline Phos 38 - 126  U/L - - -  AST 15 - 41 U/L - - -  ALT 0 - 44 U/L - - -  quantiferon gold neg Cryptococcal antigen neg HIv NR Histoplasma antigen neg Beta D glucan < 31 Fungal antibodies -pending Procal < 0.1  Microbiology: Recent Results (from the past 240 hour(s))  Blood Culture (routine x 2)     Status: None   Collection Time: 03/24/18 10:46 AM  Result Value Ref Range Status   Specimen Description BLOOD RIGHT ARM  Final   Special Requests   Final    BOTTLES DRAWN AEROBIC AND ANAEROBIC Blood Culture adequate volume   Culture   Final    NO GROWTH 5 DAYS Performed at [facility], [address], [place], [place] [address]    Report Status 03/29/2018 FINAL  Final  Blood Culture (routine x 2)     Status: None   Collection Time: 03/24/18 10:46 AM  Result Value Ref Range Status   Specimen Description BLOOD RIGHT WRIST  Final   Special Requests   Final    BOTTLES DRAWN AEROBIC AND ANAEROBIC Blood Culture adequate volume   Culture   Final    NO GROWTH 5 DAYS Performed at [facility], [address] [place], [place] [address]    Report Status 03/29/2018 FINAL  Final  Culture, sputum-assessment     Status: None   Collection Time: 03/24/18  4:15 PM  Result Value Ref Range Status   Specimen Description SPUTUM  Final   Special Requests Immunocompromised  Final   Sputum evaluation   Final    Sputum specimen not acceptable for testing.  Please recollect.   C/WANETTE MILES AT 1648 03/25/18.PMH Performed at [facility], [address] [place], [place] [address]    Report Status 03/25/2018 FINAL  Final  MRSA PCR Screening     Status: None   Collection Time: 03/24/18  4:42 PM  Result Value Ref Range Status   MRSA by PCR NEGATIVE NEGATIVE Final    Comment:        The GeneXpert MRSA Assay (FDA approved for NASAL specimens only), is one component of a comprehensive MRSA colonization surveillance program. It is not intended to diagnose MRSA infection nor to guide  or monitor treatment for MRSA infections. Performed at [facility], [address] [place],  [address]    IMAGING RESULTS:  ?  02/04/18   Impression/Recommendation ?62 y.o. 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 He says after completion of 2 cycles of chemo he started feeling SOB. ? ?Recurrent hospitalization for fever and sob and CT chest shows worsening Ground glass opacity of the left upper lobe predominantly and scattered  This GGO has multiple DD. This is unlikely to be typical bacterial pneumonia as he has not responded to antibiotics and also procal< 0.10  Beta D glucan negative which rules out PJP and some fungal infections- all other tests as listed above has come back neg DC bactrim IV. As long as he is on high dose steroids-continue bactrim I tablet a day and it can be stopped after steroid discontinued  Fungal blood screen negative  He was getting monoclonal antibodies Bevacizumab (VEGFI), Atezolizumab. The latter can cause many immune mediated patholgy including pneumonitis and that is the cause in him and started getting symptoms after 2 cycles.Marland Kitchen He  received one dose of influximab as treatment and is feeling better. 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

## 2018-04-01 NOTE — Progress Notes (Signed)
Physical Therapy Treatment Patient Details Name: James [name] MRN: [id number] DOB: [birth year] Today's Date: 04/01/2018    History of Present Illness 62yo male who comes to [facility] after worsening weakness, CP, SOB, fever; pt admitted with CAP. 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 Until recently pt's mobility has been fairly good overall, continuing to work some and access the community without AD.    PT Comments    Pt breathing heavily t/o the session and only rarely had O2 sats >90% on 3 liters (mid 80s on 4 liters during limited ambulation).  He did relatively well with brief bursts of activity (Getting to sitting, getting to standing, 10 ft of ambulation) but struggled with shortness of breath, fatigue and generally just feeling very low energy with any task.  Pt's HR >100 bpm t/o the effort and rose to 120s with activity.  He was eager to try to walk more than the 10 ft, but simply was not able to push his body any more (O2 in the mid/low 80s with ambulation).  Pt would not be able to even walk 1 room to the next at home and ultimately would not be safe.  Just 2-3 weeks ago he was community ambulator with AD, etc - hoping to regain some strength/mobiltiy at rehab.  PT is recommending STR at this time given his lack of functional improvement during this hospitalization.    Follow Up Recommendations  SNF     Equipment Recommendations  (TBD at rehab)    Recommendations for Other Services       Precautions / Restrictions Precautions Precautions: Fall Restrictions Weight Bearing Restrictions: No    Mobility  Bed Mobility Overal bed mobility: Needs Assistance Bed Mobility: Supine to Sit     Supine to sit: Supervision     General bed mobility comments: heavy effort and additional time required getting to EOB.  Pt exhausted with the effort, on 3 liters sats reached max of 90% with labored breathing t/o.    Transfers Overall transfer level: Needs assistance Equipment used: Rolling walker (2 wheeled) Transfers: Sit to/from Stand Sit to Stand: Min guard         General transfer comment: slightly raised bed pt was able to rise to standing w/o direct assist, but needing walker to maintain balance   Ambulation/Gait Ambulation/Gait assistance: Min assist Gait Distance (Feet): 10 Feet Assistive device: Rolling walker (2 wheeled)       General Gait Details: Pt struggled to manange 10 ft of ambulation.  His actual cadence, balance, safety was relatively good, however he was exhausted, short of breath and unable to push himself any further.  On 4 liters during standing/ambulation with sats dropping to mid/low 80.   Stairs             Wheelchair Mobility    Modified Rankin (Stroke Patients Only)       Balance Overall balance assessment: Needs assistance   Sitting balance-Leahy Scale: Good       Standing balance-Leahy Scale: Fair Standing balance comment: reliant on walker, but no overt unsteadiness with modest bout of ambulation                            Cognition Arousal/Alertness: Awake/alert Behavior During Therapy: WFL for tasks assessed/performed Overall Cognitive Status: Within Functional Limits for tasks assessed  Exercises      General Comments        Pertinent Vitals/Pain Pain Assessment: No/denies pain    Home Living                      Prior Function            PT Goals (current goals can now be found in the care plan section) Progress towards PT goals: Not progressing toward goals - comment(Pt's O2 requirements and fatigue with activity are limiters)    Frequency    Min 2X/week      PT Plan Discharge plan needs to be updated    Co-evaluation              AM-PAC PT "6 Clicks" Daily Activity  Outcome Measure  Difficulty turning over in bed  (including adjusting bedclothes, sheets and blankets)?: None Difficulty moving from lying on back to sitting on the side of the bed? : A Lot Difficulty sitting down on and standing up from a chair with arms (e.g., wheelchair, bedside commode, etc,.)?: A Lot Help needed moving to and from a bed to chair (including a wheelchair)?: A Little Help needed walking in hospital room?: A Lot Help needed climbing 3-5 steps with a railing? : Total 6 Click Score: 14    End of Session Equipment Utilized During Treatment: Oxygen(3 liters, 4 during ambulation) Activity Tolerance: Patient limited by fatigue Patient left: with chair alarm set;with call bell/phone within reach;with family/visitor present Nurse Communication: Mobility status(O2 drop and HR increase with activity) PT Visit Diagnosis: Unsteadiness on feet (R26.81);Muscle weakness (generalized) (M62.81);Difficulty in walking, not elsewhere classified (R26.2)     Time: 3154-0086 PT Time Calculation (min) (ACUTE ONLY): 28 min  Charges:  $Therapeutic Exercise: 8-22 mins $Therapeutic Activity: 8-22 mins                     Kreg Shropshire, DPT 04/01/2018, 12:08 PM

## 2018-04-01 NOTE — Clinical Social Work Note (Signed)
Clinical Social Work Assessment  Patient Details  Name: James [name] MRN: [id number] Date of Birth: [birth year]  Date of referral:  04/01/18               Reason for consult:  Facility Placement                Permission sought to share information with:  Case Manager, Customer service manager, Family Supports Permission granted to share information::  Yes, Verbal Permission Granted  Name::      SNF   Agency::   [facility]   Relationship::     Contact Information:     Housing/Transportation Living arrangements for the past 2 months:  [place] of Information:  Patient Patient Interpreter Needed:  None Criminal Activity/Legal Involvement Pertinent to Current Situation/Hospitalization:  No - Comment as needed Significant Relationships:  Spouse Lives with:  Spouse Do you feel safe going back to the place where you live?  Yes Need for family participation in patient care:  Yes (Comment)  Care giving concerns:  Patient lives with wife in [place] Worker assessment / plan:  CSW consulted for SNF placement. CSW met with patient and wife James [name] at bedside. CSW introduced self and explained role. Patient states that he lives at home with wife. Patient and wife feel that patient is not strong enough to return home at this time. CSW explained that PT has recommended SNF. Patient and wife are in agreement with SNF and their preference is [facility]. CSW started bed search and will give offers once received.   Employment status:  Unemployed Forensic scientist:  Managed Care PT Recommendations:  [place] / Referral to community resources:  [place]  Patient/Family's Response to care:  Patient thanked CSW for assistance   Patient/Family's Understanding of and Emotional Response to Diagnosis, Current Treatment, and Prognosis:  Patient and wife are in agreement with discharge plan   Emotional  Assessment Appearance:  Appears stated age Attitude/Demeanor/Rapport:    Affect (typically observed):  Accepting, Pleasant, Hopeful Orientation:  Oriented to Self, Oriented to Place, Oriented to  Time, Oriented to Situation Alcohol / Substance use:  Not Applicable Psych involvement (Current and /or in the community):  No (Comment)  Discharge Needs  Concerns to be addressed:  Discharge Planning Concerns Readmission within the last 30 days:  No Current discharge risk:  None Barriers to Discharge:  Continued Medical Work up   [employer], [place] 04/01/2018, 2:00 PM

## 2018-04-02 ENCOUNTER — Inpatient Hospital Stay: Payer: BC Managed Care – PPO

## 2018-04-02 DIAGNOSIS — R5081 Fever presenting with conditions classified elsewhere: Secondary | ICD-10-CM

## 2018-04-02 LAB — FUNGAL ANTIBODIES PANEL, ID-BLOOD
ASPERGILLUS FLAVUS: NEGATIVE
ASPERGILLUS NIGER: NEGATIVE
Aspergillus fumigatus, IgG: NEGATIVE
Blastomyces Abs, Qn, DID: NEGATIVE
HISTOPLASMA AB ID: NEGATIVE

## 2018-04-02 NOTE — Progress Notes (Signed)
[facility] Hematology/Oncology Progress Note  Date of admission: 03/24/2018  Hospital day:  04/02/2018  Chief Complaint: James [name] is a 62 y.o. male with stage IV lung cancer who was admitted with fevers and hypoxic respiratory failure.  Subjective:  Patient denies any worsening shortness of breath.  He has not ambulated.  Decrease in O2 sats yesterday after sitting on side of bed.  Confusion resolved.  Patient anxious to be discharged to [facility].  Social History: The patient is accompanied by his wife, Lelon Frohlich, today.  Allergies: No Known Allergies  Scheduled Medications: . diltiazem  30 mg Oral Q8H  . enoxaparin (LOVENOX) injection  40 mg Subcutaneous Q2200  . feeding supplement  1 Container Oral TID BM  . feeding supplement (NEPRO CARB STEADY)  237 mL Oral BID BM  . guaiFENesin  600 mg Oral BID  . LORazepam  0.5 mg Intravenous Once  . methylPREDNISolone (SOLU-MEDROL) injection  60 mg Intravenous Daily  . metoprolol tartrate  50 mg Oral BID  . mometasone-formoterol  2 puff Inhalation BID  . multivitamin with minerals  1 tablet Oral Daily  . pantoprazole  20 mg Oral BID  . risperiDONE  0.5 mg Oral QHS  . sodium chloride flush  3 mL Intravenous Q12H  . sulfamethoxazole-trimethoprim  1 tablet Oral Q24H    Review of Systems: GENERAL:  Fatigue. Minimal activity.  No fevers. PERFORMANCE STATUS (ECOG):  2-3 HEENT:  No visual changes, runny nose, sore throat, mouth sores or tenderness. Lungs: Shortness of breath, stable.  No cough.  No hemoptysis. Cardiac:  No chest pain, palpitations, orthopnea, or PND. GI:  No nausea, vomiting, diarrhea, constipation, melena or hematochezia. GU:  No urgency, frequency, dysuria, or hematuria. Musculoskeletal:  No back pain.  No joint pain.  No muscle tenderness. Extremities:  No pain or swelling. Skin:  No rashes or skin changes. Neuro:  General weakness.  Confusion cleared.  No headache, numbness or weakness, balance or  coordination issues. Endocrine:  No diabetes, thyroid issues, hot flashes or night sweats. Psych:  No mood changes, depression or anxiety. Pain:  No focal pain. Review of systems:  All other systems reviewed and found to be negative.  Physical Exam: Blood pressure (!) 131/100, pulse (!) 103, temperature (!) 97.5 F (36.4 C), temperature source Oral, resp. rate 16, height 5\' 10"  (1.778 m), weight 128 lb 14.4 oz (58.5 kg), SpO2 94 %.  GENERAL:  Chronically fatigued appearing thin gentleman sitting in a lounge chair on the medical in no acute distress. MENTAL STATUS:  Alert and oriented to person, place and time. HEAD:  Alopecia.  Normocephalic, atraumatic, face symmetric, no Cushingoid features. EYES:  Pupils equal round and reactive to light and accomodation.  No conjunctivitis or scleral icterus. ENT:  [place] in place.  Oropharynx clear without lesion.  Tongue normal. Mucous membranes moist.  RESPIRATORY:  Dry crackles left lower lobe.  Decreased breath sounds right lower lobe. No wheezes or rhonchi. CARDIOVASCULAR:  Regular rate and rhythm without murmur, rub or gallop. ABDOMEN:  Soft, non-tender, with active bowel sounds, and no hepatosplenomegaly.  No masses. SKIN:  No rashes, ulcers or lesions. EXTREMITIES: No edema, no skin discoloration or tenderness.  No palpable cords. LYMPH NODES: No palpable cervical, supraclavicular, axillary or inguinal adenopathy  NEUROLOGICAL: Unremarkable. PSYCH:  Appropriate.   Results for orders placed or performed during the hospital encounter of 03/24/18 (from the past 48 hour(s))  CBC     Status: Abnormal   Collection Time:  04/01/18  4:57 AM  Result Value Ref Range   WBC 7.6 4.0 - 10.5 K/uL   RBC 3.43 (L) 4.22 - 5.81 MIL/uL   Hemoglobin 11.3 (L) 13.0 - 17.0 g/dL   HCT 34.3 (L) 39.0 - 52.0 %   MCV 100.0 80.0 - 100.0 fL   MCH 32.9 26.0 - 34.0 pg   MCHC 32.9 30.0 - 36.0 g/dL   RDW 14.6 11.5 - 15.5 %   Platelets 228 150 - 400 K/uL   nRBC 0.0 0.0 - 0.2  %    Comment: Performed at [facility], [address] [place],  [address]   Dg Chest 2 View  Result Date: 04/02/2018 CLINICAL DATA:  Stage IV lung cancer of right upper lobe with mets to liver and brain. Hx of COPD and collapsed lung- 2012. Admitted to hospital 03/24/2018 with fevers and hypoxic respiratory failure. Former smoker. EXAM: CHEST - 2 VIEW COMPARISON:  03/30/2018 FINDINGS: Coarse somewhat nodular airspace opacities throughout anterior left upper lobe and lingula, left lung base, and in the right infrahilar region, with slight worsening since previous exam. Heart size and mediastinal contours are within normal limits. There is blunting of posterior costophrenic angles suggesting small effusions. Visualized bones unremarkable. IMPRESSION: 1. Slight worsening of asymmetric airspace disease. 2. Possible small pleural effusions. Electronically Signed   By: Lucrezia Europe M.D.   On: 04/02/2018 10:19    Assessment:  James [name] is a 62 y.o. male with metastatic adenocarcinoma of the lung s/p 4 cycles of carboplatin, Taxol, Tecentriq + Avastin.  He was admitted with worsening shortness of breath.  CXR today reveals slight worsening of asymmetric airspace disease and possible small pleural effusions.  Symptomatically, he is anxious to be discharged.  He denies any increased shortness of breath.  Oxygen is being weaned.  Plan: 1. Oncology:  Chemotherapy currently on hold. 2. Pulmonary:  Bilateral ground glass infiltrates possibly related to immunotherapy or infectious etiology.  Solumedrol has been tapered down to 60 mg a day.  Patient s/p infliximab 5 mg/kg x 2 (11/13 and 11/15). 3. Hematology:  DVT prophylaxis with Lovenox SQ. 4. Disposition:  Patient has limited pulmonary reserve.  Clinically, he is improving.  Discussed with patient plan to remain hospitalized today and possibly tomorrow.  Patient to work with physical therapy and ambulate down halls with  oximetry.  Anticipate discharge to [facility] soon.   Lequita Asal, MD  04/02/2018, 4:11 PM

## 2018-04-03 MED ORDER — METHYLPREDNISOLONE SODIUM SUCC 40 MG IJ SOLR
40.0000 mg | Freq: Every day | INTRAMUSCULAR | Status: DC
  Administered 2018-04-04: 40 mg via INTRAVENOUS
  Filled 2018-04-03: qty 1

## 2018-04-03 NOTE — Progress Notes (Signed)
[facility] [place] NAME: James [name]    MR#:  [id number]  DATE OF BIRTH:  1956/05/10  SUBJECTIVE:  CHIEF COMPLAINT:   Chief Complaint  Patient presents with  . Chest Pain  Patient feeling better, wife at the bedside, oncology/pulmonology/palliative care/infectious disease input appreciated  REVIEW OF SYSTEMS:  CONSTITUTIONAL: No fever, fatigue or weakness.  EYES: No blurred or double vision.  EARS, NOSE, AND THROAT: No tinnitus or ear pain.  RESPIRATORY: No cough, shortness of breath, wheezing or hemoptysis.  CARDIOVASCULAR: No chest pain, orthopnea, edema.  GASTROINTESTINAL: No nausea, vomiting, diarrhea or abdominal pain.  GENITOURINARY: No dysuria, hematuria.  ENDOCRINE: No polyuria, nocturia,  HEMATOLOGY: No anemia, easy bruising or bleeding SKIN: No rash or lesion. MUSCULOSKELETAL: No joint pain or arthritis.   NEUROLOGIC: No tingling, numbness, weakness.  PSYCHIATRY: No anxiety or depression.   ROS  DRUG ALLERGIES:  No Known Allergies  VITALS:  Blood pressure 114/85, pulse 80, temperature (!) 97.3 F (36.3 C), temperature source Oral, resp. rate 18, height '5\' 10"'  (1.778 m), weight 59.6 kg, SpO2 98 %.  PHYSICAL EXAMINATION:  GENERAL:  62 y.o.-year-old patient lying in the bed with no acute distress.  EYES: Pupils equal, round, reactive to light and accommodation. No scleral icterus. Extraocular muscles intact.  HEENT: Head atraumatic, normocephalic. Oropharynx and nasopharynx clear.  NECK:  Supple, no jugular venous distention. No thyroid enlargement, no tenderness.  LUNGS: Normal breath sounds bilaterally, no wheezing, rales,rhonchi or crepitation. No use of accessory muscles of respiration.  CARDIOVASCULAR: S1, S2 normal. No murmurs, rubs, or gallops.  ABDOMEN: Soft, nontender, nondistended. Bowel sounds present. No organomegaly or mass.  EXTREMITIES: No pedal edema, cyanosis, or clubbing.  NEUROLOGIC: Cranial nerves II  through XII are intact. Muscle strength 5/5 in all extremities. Sensation intact. Gait not checked.  PSYCHIATRIC: The patient is alert and oriented x 3.  SKIN: No obvious rash, lesion, or ulcer.   Physical Exam LABORATORY PANEL:   CBC Recent Labs  Lab 04/01/18 0457  WBC 7.6  HGB 11.3*  HCT 34.3*  PLT 228   ------------------------------------------------------------------------------------------------------------------  Chemistries  Recent Labs  Lab 03/30/18 1016 03/31/18 0532  NA 137  --   K 4.5  --   CL 106  --   CO2 22  --   GLUCOSE 129*  --   BUN 23  --   CREATININE 0.62 0.63  CALCIUM 7.8*  --    ------------------------------------------------------------------------------------------------------------------  Cardiac Enzymes No results for input(s): TROPONINI in the last 168 hours. ------------------------------------------------------------------------------------------------------------------  RADIOLOGY:  Dg Chest 2 View  Result Date: 04/02/2018 CLINICAL DATA:  Stage IV lung cancer of right upper lobe with mets to liver and brain. Hx of COPD and collapsed lung- 2012. Admitted to hospital 03/24/2018 with fevers and hypoxic respiratory failure. Former smoker. EXAM: CHEST - 2 VIEW COMPARISON:  03/30/2018 FINDINGS: Coarse somewhat nodular airspace opacities throughout anterior left upper lobe and lingula, left lung base, and in the right infrahilar region, with slight worsening since previous exam. Heart size and mediastinal contours are within normal limits. There is blunting of posterior costophrenic angles suggesting small effusions. Visualized bones unremarkable. IMPRESSION: 1. Slight worsening of asymmetric airspace disease. 2. Possible small pleural effusions. Electronically Signed   By: Lucrezia Europe M.D.   On: 04/02/2018 10:19    ASSESSMENT AND PLAN:  62 year old male with past medical history significant for adenocarcinoma of the right upper lung with solitary  lesion to brain status post  radiation, liver mets and bone mets presents to hospital secondary to worsening shortness of breath and fever for 2 days.  *Acute on chronic hypoxic respiratory failure Resolving Secondary to pneumonia, lung cancer-on chemo/radiation, currently on 3 L via nasal cannula  *Acute b/l HCAP Concerned for pneumonitis due to his immunomodulator therapy for lung cancer Is currently on cefepime/Bactrim -concern for opportunistic infection, infectious disease input appreciated Continue IV Solu-Medrol as recommended by Dr. Brahmanday/oncology Wean O2 off as tolerated, physical therapy following-inpatient rehab/skilled nurse facility status post discharge, mucolytic agents, pulmonary toilet with bronchodilator therapy  *Chronic lung cancer with brain met On chemo and radiation Per Dr. Rogue Bussing - for another round of infliximab for possible inflammatory lung condition, antibiotics per above  *Acute psychosis Resolved Continue Risperdal at bedtime  *Anemia of chronic disease Stable  *Chronic benign essential hypertension  Controlled on current regiment  *COPD Continue current regiment, pulmonology input greatly appreciated, wean O2 off as tolerated  *anxiety Stable on current regiment  *Acute dehydration  IV fluids for rehydration  Disposition to inpatient rehab/skilled nursing facility status post discharge once cleared by oncology - stable for discharge from medical standpoint on home oxygen Palliative care following  All the records are reviewed and case discussed with Care Management/Social Workerr. Management plans discussed with the patient, family and they are in agreement.  CODE STATUS: dnr  TOTAL TIME TAKING CARE OF THIS PATIENT: 45 minutes.     POSSIBLE D/C IN 1-3 DAYS, DEPENDING ON CLINICAL CONDITION.   Avel Peace Josue Falconi M.D on 04/03/2018   Between 7am to 6pm - Pager - [phone]  After 6pm go to [email or web address] - password [email or web address]  [facility]  Office  [phone]  CC: Primary care physician; Valerie Roys, DO  Note: This dictation was prepared with Dragon dictation along with smaller phrase technology. Any transcriptional errors that result from this process are unintentional.

## 2018-04-03 NOTE — Progress Notes (Signed)
James [name] Hematology/Oncology Progress Note  Date of admission: 03/24/2018  Hospital day:  04/03/2018  Chief Complaint: James [name] is a 63 y.o. male with stage IV lung cancer who was admitted with fevers and hypoxic respiratory failure.  Subjective:  Patient feeling better today.  He walked 120 steps around nurses station with O2 sats 89-92% on 2 liters/min via nasal cannula.  Patient struggled with BM this morning necessitating increasing oxygen to 3 liters/min.  Patient wakened in night to "unusual" visual perspective of TV.  He denies confusion today.  Social History: The patient is accompanied by his wife, James [name], today.  Allergies: No Known Allergies  Scheduled Medications: . diltiazem  30 mg Oral Q8H  . enoxaparin (LOVENOX) injection  40 mg Subcutaneous Q2200  . feeding supplement  1 Container Oral TID BM  . feeding supplement (NEPRO CARB STEADY)  237 mL Oral BID BM  . guaiFENesin  600 mg Oral BID  . LORazepam  0.5 mg Intravenous Once  . [START ON 04/04/2018] methylPREDNISolone (SOLU-MEDROL) injection  40 mg Intravenous Daily  . metoprolol tartrate  50 mg Oral BID  . mometasone-formoterol  2 puff Inhalation BID  . multivitamin with minerals  1 tablet Oral Daily  . pantoprazole  20 mg Oral BID  . risperiDONE  0.5 mg Oral QHS  . sodium chloride flush  3 mL Intravenous Q12H  . sulfamethoxazole-trimethoprim  1 tablet Oral Q24H    Review of Systems: GENERAL:  Chronic fatigue.  No fevers. PERFORMANCE STATUS (ECOG):  2-3 HEENT:  No visual changes, runny nose, sore throat, mouth sores or tenderness. Lungs:  Walking down the hall without increased SOB on oxygen.  Shortness of breath, stable.  No cough.  No hemoptysis. Cardiac:  No chest pain, palpitations, orthopnea, or PND. GI:  No nausea, vomiting, diarrhea, constipation, melena or hematochezia. GU:  No urgency, frequency, dysuria, or hematuria. Musculoskeletal:  No back pain.  No joint pain.  No muscle  tenderness. Extremities:  No pain or swelling. Skin:  No rashes or skin changes. Neuro:  General weakness.  Unusual vision sensation when looking at TV early this morning.  No headache, numbness or weakness, balance or coordination issues. Endocrine:  No diabetes, thyroid issues, hot flashes or night sweats. Psych:  No mood changes, depression or anxiety. Pain:  No focal pain. Review of systems:  All other systems reviewed and found to be negative.   Physical Exam: Blood pressure 114/85, pulse 80, temperature (!) 97.3 F (36.3 C), temperature source Oral, resp. rate 18, height 5\' 10"  (1.778 m), weight 131 lb 6.3 oz (59.6 kg), SpO2 98 %.GENERAL:  Chronically fatigued appearing gentleman sitting up on the medical unit in no acute distress. MENTAL STATUS:  Alert and oriented to person, place and time. HEAD:  Alopecia.  Normocephalic, atraumatic, face symmetric, no Cushingoid features. EYES:  Blue eyes.  Pupils equal round and reactive to light and accomodation.  No conjunctivitis or scleral icterus. ENT:  [place] in place.  Oropharynx clear without lesion.  Tongue normal. Mucous membranes moist.  RESPIRATORY:  Dry crackles left lower lobe.  Decreased breath sounds right lower lobe (unchanged).  No wheezes or rhonchi. CARDIOVASCULAR:  Regular rate and rhythm without murmur, rub or gallop. ABDOMEN:  Soft, non-tender, with active bowel sounds, and no hepatosplenomegaly.  No masses. SKIN:  No rashes, ulcers or lesions. EXTREMITIES: No edema, no skin discoloration or tenderness.  No palpable cords. LYMPH NODES: No palpable cervical, supraclavicular, axillary or inguinal adenopathy  NEUROLOGICAL:  Unremarkable. PSYCH:  Appropriate.   No results found for this or any previous visit (from the past 48 hour(s)). Dg Chest 2 View  Result Date: 04/02/2018 CLINICAL DATA:  Stage IV lung cancer of right upper lobe with mets to liver and brain. Hx of COPD and collapsed lung- 2012. Admitted to hospital 03/24/2018  with fevers and hypoxic respiratory failure. Former smoker. EXAM: CHEST - 2 VIEW COMPARISON:  03/30/2018 FINDINGS: Coarse somewhat nodular airspace opacities throughout anterior left upper lobe and lingula, left lung base, and in the right infrahilar region, with slight worsening since previous exam. Heart size and mediastinal contours are within normal limits. There is blunting of posterior costophrenic angles suggesting small effusions. Visualized bones unremarkable. IMPRESSION: 1. Slight worsening of asymmetric airspace disease. 2. Possible small pleural effusions. Electronically Signed   By: Lucrezia Europe M.D.   On: 04/02/2018 10:19    Assessment:  James [name] is a 62 y.o. male with metastatic adenocarcinoma of the lung s/p 4 cycles of carboplatin, Taxol, Tecentriq + Avastin.  He was admitted with worsening shortness of breath.  CXR today reveals slight worsening of asymmetric airspace disease and possible small pleural effusions.  Symptomatically, he is feeling better.  He is ambulating with oxygen.  Exam is stable.  Plan: 1. Oncology:  Chemotherapy currently on hold. 2. Pulmonary:  Bilateral ground glass infiltrates possibly related to immunotherapy or infectious etiology.  CXR yesterday per report was slightly worse (no significant change per my review).  CXR findings may lag clinic findings.  Solumedrol has been tapered down to 40 mg a day beginning tomorrow.  Patient s/p infliximab 5 mg/kg x 2 (11/13 and 11/15). 3. Hematology:  DVT prophylaxis with Lovenox SQ. 4. Disposition:  Patient has limited pulmonary reserve.  Clinically, he is improving.  He is walking down hall (120 steps) with oxygen 2 liter/min and O2 sats 89-92%,.  Recent trip to bathroom resulted in shortness of breath and increase oxygen temporarily to 3 liters/min.  Discussed with patient plan to remain hospitalized with possible discharge tomorrow.  Anticipate 2 week outpatient steroid taper.  Dr. Rogue Bussing to  determine if patient can be discharged to [facility] tomorrow.   Lequita Asal, MD  04/03/2018, 11:41 AM

## 2018-04-04 ENCOUNTER — Other Ambulatory Visit: Payer: Self-pay | Admitting: Hospice and Palliative Medicine

## 2018-04-04 ENCOUNTER — Telehealth: Payer: Self-pay | Admitting: Internal Medicine

## 2018-04-04 DIAGNOSIS — J449 Chronic obstructive pulmonary disease, unspecified: Secondary | ICD-10-CM

## 2018-04-04 DIAGNOSIS — J9601 Acute respiratory failure with hypoxia: Secondary | ICD-10-CM

## 2018-04-04 DIAGNOSIS — R451 Restlessness and agitation: Secondary | ICD-10-CM

## 2018-04-04 DIAGNOSIS — C7951 Secondary malignant neoplasm of bone: Secondary | ICD-10-CM

## 2018-04-04 MED ORDER — LORAZEPAM 1 MG PO TABS: 1.0000 mg | ORAL_TABLET | Freq: Every evening | ORAL | 0 refills | Status: DC | PRN

## 2018-04-04 MED ORDER — BUDESONIDE 0.25 MG/2ML IN SUSP: 0.2500 mg | Freq: Two times a day (BID) | RESPIRATORY_TRACT | Status: DC

## 2018-04-04 MED ORDER — GUAIFENESIN ER 600 MG PO TB12: 600.0000 mg | ORAL_TABLET | Freq: Two times a day (BID) | ORAL | Status: AC

## 2018-04-04 MED ORDER — PREDNISONE 50 MG PO TABS: ORAL_TABLET | ORAL | Status: AC

## 2018-04-04 MED ORDER — RISPERIDONE 0.5 MG PO TBDP: 0.5000 mg | ORAL_TABLET | Freq: Every day | ORAL | 0 refills | Status: DC

## 2018-04-04 MED ORDER — BUDESONIDE 0.25 MG/2ML IN SUSP: 0.2500 mg | Freq: Two times a day (BID) | RESPIRATORY_TRACT | 12 refills | Status: AC

## 2018-04-04 MED ORDER — DILTIAZEM HCL 30 MG PO TABS: 30.0000 mg | ORAL_TABLET | Freq: Three times a day (TID) | ORAL | Status: DC

## 2018-04-04 MED ORDER — TRAZODONE HCL 50 MG PO TABS: 50.0000 mg | ORAL_TABLET | Freq: Every evening | ORAL | 0 refills | Status: DC | PRN

## 2018-04-04 MED ORDER — SULFAMETHOXAZOLE-TRIMETHOPRIM 800-160 MG PO TABS: 1.0000 | ORAL_TABLET | Freq: Every day | ORAL | 0 refills | Status: AC

## 2018-04-04 MED ORDER — LEVALBUTEROL HCL 1.25 MG/0.5ML IN NEBU: 0.6300 mg | INHALATION_SOLUTION | Freq: Four times a day (QID) | RESPIRATORY_TRACT | 12 refills | Status: DC | PRN

## 2018-04-04 NOTE — Telephone Encounter (Signed)
[facility]-pt being discharged today Please make appt for pt to follow up with me on 11./22 at 8:30/ labs- cbc/cmp- Thx

## 2018-04-04 NOTE — Progress Notes (Signed)
James [name]   DOB:December 19, 1955   SA#:[id number]    Subjective: Patient's breathing is improved.  Is currently on 1.5 to 2 L.  He is ambulating the hallways.   Denies any nausea vomiting.  Denies any headaches.  Eager to go home.  Objective:  Vitals:   04/03/18 2043 04/04/18 0449  BP: (!) 134/94 108/76  Pulse: 94 76  Resp: 16   Temp: 98.1 F (36.7 C) 97.7 F (36.5 C)  SpO2: 96% 97%     Intake/Output Summary (Last 24 hours) at 04/04/2018 0820 Last data filed at 04/04/2018 3235 Gross per 24 hour  Intake 145 ml  Output 900 ml  Net -755 ml    GENERAL: Thin cachectic male patient resting in the bed.  No acute distress.  2 L saturating 97%.   EYES: no pallor or icterus OROPHARYNX: no thrush or ulceration. NECK: supple, no masses felt LYMPH:  no palpable lymphadenopathy in the cervical, axillary or inguinal regions LUNGS: decreased breath sounds to auscultation R>L.   No wheeze or crackles HEART/CVS: regular rate & rhythm and no murmurs; No lower extremity edema ABDOMEN: abdomen soft, non-tender and normal bowel sounds Musculoskeletal:no cyanosis of digits and no clubbing  PSYCH: alert & oriented x 3 with fluent speech NEURO: no focal motor/sensory deficits SKIN:  no rashes or significant lesions   Labs:  Lab Results  Component Value Date   WBC 7.6 04/01/2018   HGB 11.3 (L) 04/01/2018   HCT 34.3 (L) 04/01/2018   MCV 100.0 04/01/2018   PLT 228 04/01/2018   NEUTROABS 6.3 03/24/2018    Lab Results  Component Value Date   NA 137 03/30/2018   K 4.5 03/30/2018   CL 106 03/30/2018   CO2 22 03/30/2018    Studies:  No results found.  Assessment & Plan:   # 61 year old male patient history of metastatic adenocarcinoma the lung currently status post 4 cycles of chemo immunotherapy is admitted the hospital for worsening shortness of breath  # Acute respiratory failure needing oxygen-bilateral groundglass infiltrates left more than right-suspicious of inflammatory pneumonitis  secondary to immunotherapy.  Patient status post infliximab x2 [13th& 15th]; currently on Solu-Medrol 40 mg.  Proceed with morning dose today.  At discharge as planned today-recommend prednisone 60 mg once a day; patient will need taper over the next 3 to 4 weeks.  Continue Xopenex inhalation.  # Metastatic adenocarcinoma the lung-currently status post 4 cycles of carbotaxol-atezo+bev-CT scan shows STABLE Lung ca; bilateral infiltrates/groundglass; less likely lymphangitic spread.  Will need CT of the abdomen pelvis as outpatient.  #Agitation/anxiety secondary to delirium-second steroids/polypharmacy-improved.  #Disposition: Planned today to [facility].  Patient follow-up with me on this Friday/22nd-at the cancer center.  Discussed with patient and wife in detail.  They agree with the plan.  Cammie Sickle, MD 04/04/2018  8:20 AM

## 2018-04-04 NOTE — Clinical Social Work Note (Signed)
Patient is medically ready for discharge today. CSW notified patient and wife at bedside of discharge to [facility] today and both are in agreement. CSW notified Tammy at [facility] of discharge today. Patient will be transported by EMS. RN to call report and call for transport.   [place], [place]

## 2018-04-04 NOTE — Progress Notes (Signed)
[place]  [phone] [phone]   Name: James [name] Date: 04/04/2018 MRN: [id number]  DOB: August 25, 1955  Patient Care Team: Valerie Roys, DO as PCP - General (Family Medicine) Telford Nab, RN as Registered Nurse Rogue Bussing, Elisha Headland, MD as Medical Oncologist (Medical Oncology)    REASON FOR CONSULTATION: Palliative Care consult requested for this 62 y.o. male with multiple medical problems including stage IV lung cancer last treated in October with chemotherapy/immunotherapy, history of tobacco abuse, and COPD, who was recently hospitalized 03/04/2018 to 03/07/2018 with shortness of breath, nausea and vomiting after receiving chemotherapy.  He was treated empirically for possible pneumonia.  Admitted on 03/24/2018 with fevers and hypoxic respiratory failure.  Chest CT revealed bilateral areas of groundglass appearance consistent with multifocal infection or inflammation.  Patient has been treated empirically for possible pneumonia versus pneumonitis from the immunotherapy.  Patient's hospitalization has been further complicated by persistent shortness of breath and delirium.  Palliative care was consulted to help manage symptoms and address goals.   CODE STATUS: DNR  PAST MEDICAL HISTORY: Past Medical History:  Diagnosis Date  . Cancer ([place]) 12/01/2017   Primary cancer of right upper lobe of lung. Mets to liver, brain.  Marland Kitchen Prepatellar bursitis of left knee    patient denies    PAST SURGICAL HISTORY:  Past Surgical History:  Procedure Laterality Date  . collapsed lung  2012   "had to reinflate" " i carried large piece of sheet rock up stairs by myself "   . [place]   x2 , second was with mesh   . TOTAL HIP ARTHROPLASTY Right 04/09/2017   Procedure: RIGHT TOTAL HIP ARTHROPLASTY ANTERIOR APPROACH;  Surgeon: Mcarthur Rossetti, MD;  Location: [facility];  Service: Orthopedics;  Laterality:  Right;  . URETHRAL STRICTURE DILATATION      HEMATOLOGY/ONCOLOGY HISTORY:  Oncology History   # July 2019- [facility]- RUL [s/p Liver Bx]-CT chest- RUL/ liver/ brain solitary lesion.  to contralateral lung; pleura; liver and brain.July 2019- A/P- liver metastases at least 2; bone scan shows right humeral; left fifth rib; L2 vertebral body metastases.  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  --------------------------------------------------------------------   DIAGNOSIS: Adenocarcinoma lung  STAGE: 4       ;GOALS: Palliative  CURRENT/MOST RECENT THERAPY -CARBO+TAXOL+ATEZO+AVASTIN      Primary cancer of right upper lobe of lung ([place])   11/24/2017 -  Chemotherapy    The patient had bevacizumab (AVASTIN) 1,000 mg in sodium chloride 0.9 % 100 mL chemo infusion, 15 mg/kg = 1,000 mg, Intravenous,  Once, 4 of 4 cycles Administration: 1,000 mg (12/16/2017), 1,000 mg (01/19/2018), 1,000 mg (02/09/2018), 1,000 mg (03/03/2018)  for chemotherapy treatment.     12/09/2017 -  Chemotherapy    The patient had palonosetron (ALOXI) injection 0.25 mg, 0.25 mg, Intravenous,  Once, 4 of 4 cycles Administration: 0.25 mg (12/16/2017), 0.25 mg (01/19/2018), 0.25 mg (02/09/2018), 0.25 mg (03/03/2018) CARBOplatin (PARAPLATIN) 670 mg in sodium chloride 0.9 % 250 mL chemo infusion, 670 mg (100 % of original dose 667.8 mg), Intravenous,  Once, 4 of 4 cycles Dose modification:   (original dose 667.8 mg, Cycle 1, Reason: Provider Judgment) Administration: 670 mg (12/16/2017), 610 mg (01/19/2018), 610 mg (02/09/2018) PACLitaxel (TAXOL) 360 mg in sodium chloride 0.9 % 500 mL chemo infusion (> 80mg /m2), 200 mg/m2 = 360 mg, Intravenous,  Once, 4 of 4 cycles Administration: 360 mg (12/16/2017), 360 mg (01/19/2018), 360 mg (02/09/2018), 360 mg (03/03/2018) atezolizumab (TECENTRIQ) 1,200 mg in sodium chloride 0.9 % 250 mL chemo infusion,  1,200 mg, Intravenous, Once, 4 of 4 cycles Administration: 1,200 mg (12/16/2017), 1,200 mg (01/19/2018), 1,200 mg (02/09/2018), 1,200 mg (03/03/2018)  for chemotherapy treatment.      ALLERGIES:  has No Known Allergies.  MEDICATIONS:  Current Facility-Administered Medications  Medication Dose Route Frequency Provider Last Rate Last Dose  . 0.9 %  sodium chloride infusion   Intravenous PRN Epifanio Lesches, MD   Stopped at 04/01/18 3329  . acetaminophen (TYLENOL) tablet 650 mg  650 mg Oral Q6H PRN Hillary Bow, MD   650 mg at 04/02/18 2349   Or  . acetaminophen (TYLENOL) suppository 650 mg  650 mg Rectal Q6H PRN Sudini, Alveta Heimlich, MD      . benzonatate (TESSALON) capsule 200 mg  200 mg Oral TID PRN Hillary Bow, MD   200 mg at 03/28/18 2150  . budesonide (PULMICORT) nebulizer solution 0.25 mg  0.25 mg Nebulization BID Dustin Flock, MD      . diltiazem (CARDIZEM) tablet 30 mg  30 mg Oral Q8H Epifanio Lesches, MD   30 mg at 04/04/18 0532  . enoxaparin (LOVENOX) injection 40 mg  40 mg Subcutaneous Q2200 Hillary Bow, MD   40 mg at 04/03/18 2149  . feeding supplement (BOOST / RESOURCE BREEZE) liquid 1 Container  1 Container Oral TID BM Epifanio Lesches, MD   1 Container at 04/04/18 1012  . feeding supplement (NEPRO CARB STEADY) liquid 237 mL  237 mL Oral BID BM Epifanio Lesches, MD   Stopped at 04/03/18 2317  . guaiFENesin (MUCINEX) 12 hr tablet 600 mg  600 mg Oral BID Salary, Montell D, MD   600 mg at 04/04/18 1012  . levalbuterol (XOPENEX) nebulizer solution 0.63 mg  0.63 mg Nebulization Q6H PRN Wilhelmina Mcardle, MD      . LORazepam (ATIVAN) injection 0.5 mg  0.5 mg Intravenous Once Gladstone Lighter, MD      . methylPREDNISolone sodium succinate (SOLU-MEDROL) 40 mg/mL injection 40 mg  40 mg Intravenous Daily Nolon Stalls C, MD   40 mg at 04/04/18 1011  . metoprolol tartrate (LOPRESSOR) injection 5 mg  5 mg Intravenous Q4H PRN Dustin Flock, MD   5 mg at 03/29/18 0019    . metoprolol tartrate (LOPRESSOR) tablet 50 mg  50 mg Oral BID Epifanio Lesches, MD   50 mg at 04/04/18 1012  . mometasone-formoterol (DULERA) 100-5 MCG/ACT inhaler 2 puff  2 puff Inhalation BID Hillary Bow, MD   2 puff at 04/04/18 1012  . morphine 2 MG/ML injection 1-2 mg  1-2 mg Intravenous Q2H PRN Cammie Sickle, MD   2 mg at 04/03/18 2158  . multivitamin with minerals tablet 1 tablet  1 tablet Oral Daily Epifanio Lesches, MD   1 tablet at 04/04/18 1012  . ondansetron (ZOFRAN) tablet 4 mg  4 mg Oral Q6H PRN Hillary Bow, MD       Or  . ondansetron (ZOFRAN) injection 4 mg  4 mg Intravenous Q6H PRN Hillary Bow, MD   4 mg at 03/24/18 1736  . pantoprazole (PROTONIX) EC tablet 20 mg  20 mg Oral BID Charlaine Dalton R, MD   20 mg at 04/04/18 1012  . polyethylene glycol (MIRALAX / GLYCOLAX) packet 17 g  17 g Oral Daily PRN Hillary Bow, MD      . risperiDONE (  RISPERDAL M-TABS) disintegrating tablet 0.5 mg  0.5 mg Oral QHS Epifanio Lesches, MD   0.5 mg at 04/03/18 2150  . senna (SENOKOT) tablet 8.6 mg  1 tablet Oral Daily PRN Raziah Funnell, Kirt Boys, NP   8.6 mg at 03/31/18 1723  . sodium chloride flush (NS) 0.9 % injection 3 mL  3 mL Intravenous Q12H Sudini, Srikar, MD   3 mL at 04/04/18 1017  . sulfamethoxazole-trimethoprim (BACTRIM DS,SEPTRA DS) 800-160 MG per tablet 1 tablet  1 tablet Oral Q24H Tsosie Billing, MD   1 tablet at 04/04/18 1012  . traZODone (DESYREL) tablet 50 mg  50 mg Oral QHS PRN Lance Coon, MD   50 mg at 04/03/18 2150    VITAL SIGNS: BP 102/75 (BP Location: Left Arm)   Pulse 94   Temp 97.7 F (36.5 C) (Oral)   Resp 18   Ht 5\' 10"  (1.778 m)   Wt 135 lb 5.8 oz (61.4 kg)   SpO2 94%   BMI 19.42 kg/m  Filed Weights   04/03/18 0433 04/04/18 0500 04/04/18 0621  Weight: 131 lb 6.3 oz (59.6 kg) 145 lb (65.8 kg) 135 lb 5.8 oz (61.4 kg)    Estimated body mass index is 19.42 kg/m as calculated from the following:   Height as of this  encounter: 5\' 10"  (1.778 m).   Weight as of this encounter: 135 lb 5.8 oz (61.4 kg).  LABS: CBC:    Component Value Date/Time   WBC 7.6 04/01/2018 0457   HGB 11.3 (L) 04/01/2018 0457   HGB 13.3 08/24/2017 1651   HCT 34.3 (L) 04/01/2018 0457   HCT 40.5 08/24/2017 1651   PLT 228 04/01/2018 0457   PLT 268 08/24/2017 1651   MCV 100.0 04/01/2018 0457   MCV 99 (H) 08/24/2017 1651   MCV 96 04/23/2013 1330   NEUTROABS 6.3 03/24/2018 1046   NEUTROABS 3.8 08/24/2017 1651   LYMPHSABS 0.6 (L) 03/24/2018 1046   LYMPHSABS 1.5 08/24/2017 1651   MONOABS 1.0 03/24/2018 1046   EOSABS 0.1 03/24/2018 1046   EOSABS 0.3 08/24/2017 1651   BASOSABS 0.0 03/24/2018 1046   BASOSABS 0.0 08/24/2017 1651   Comprehensive Metabolic Panel:    Component Value Date/Time   NA 137 03/30/2018 1016   NA 138 08/24/2017 1651   NA 137 04/23/2013 1330   K 4.5 03/30/2018 1016   K 3.8 04/23/2013 1330   CL 106 03/30/2018 1016   CL 105 04/23/2013 1330   CO2 22 03/30/2018 1016   CO2 22 04/23/2013 1330   BUN 23 03/30/2018 1016   BUN 18 08/24/2017 1651   BUN 18 04/23/2013 1330   CREATININE 0.63 03/31/2018 0532   CREATININE 0.90 04/23/2013 1330   GLUCOSE 129 (H) 03/30/2018 1016   GLUCOSE 120 (H) 04/23/2013 1330   CALCIUM 7.8 (L) 03/30/2018 1016   CALCIUM 9.3 04/23/2013 1330   AST 42 (H) 03/24/2018 1046   AST 11 (L) 04/23/2013 1330   ALT 43 03/24/2018 1046   ALT 18 04/23/2013 1330   ALKPHOS 63 03/24/2018 1046   ALKPHOS 64 04/23/2013 1330   BILITOT 0.8 03/24/2018 1046   BILITOT 0.2 08/24/2017 1651   BILITOT 0.6 04/23/2013 1330   PROT 7.2 03/24/2018 1046   PROT 6.8 08/24/2017 1651   PROT 7.5 04/23/2013 1330   ALBUMIN 3.0 (L) 03/24/2018 1046   ALBUMIN 4.2 08/24/2017 1651   ALBUMIN 3.8 04/23/2013 1330    RADIOGRAPHIC STUDIES: Dg Chest 2 View  Result Date: 04/02/2018 CLINICAL DATA:  Stage IV lung cancer of right upper lobe with mets to liver and brain. Hx of COPD and collapsed lung- 2012. Admitted to  hospital 03/24/2018 with fevers and hypoxic respiratory failure. Former smoker. EXAM: CHEST - 2 VIEW COMPARISON:  03/30/2018 FINDINGS: Coarse somewhat nodular airspace opacities throughout anterior left upper lobe and lingula, left lung base, and in the right infrahilar region, with slight worsening since previous exam. Heart size and mediastinal contours are within normal limits. There is blunting of posterior costophrenic angles suggesting small effusions. Visualized bones unremarkable. IMPRESSION: 1. Slight worsening of asymmetric airspace disease. 2. Possible small pleural effusions. Electronically Signed   By: Lucrezia Europe M.D.   On: 04/02/2018 10:19   Dg Chest 2 View  Result Date: 03/30/2018 CLINICAL DATA:  Fever and shortness of breath for the past 2 days with clinical pneumonia. 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 EXAM: CHEST - 2 VIEW COMPARISON:  Portable chest x-ray of March 27, 2018 FINDINGS: The lungs are adequately inflated. There has been further interval increase in the interstitial infiltrates in the left lung. There is subtle increased density in the right upper lobe which is stable. There is no mediastinal shift. The heart and pulmonary vascularity are normal. The bony thorax exhibits no acute abnormality. IMPRESSION: Slightly increased interstitial densities throughout the left lung worrisome for pneumonia. Fairly stable density in the right pulmonary apex. No no overt CHF. Electronically Signed   By: David  Martinique M.D.   On: 03/30/2018 09:03   Dg Chest 2 View  Result Date: 03/24/2018 CLINICAL DATA:  Short of breath, cough, history of lung carcinoma with metastasis to the liver and brain EXAM: CHEST - 2 VIEW COMPARISON:  Portable chest x-ray of 03/05/2018 and CT chest of 02/04/2018 FINDINGS: There is more opacity now present within the left mid and lower lung suspicious for developing pneumonia. Somewhat coarse and prominent markings remain bilaterally  as well. Mediastinal and hilar contours are unchanged and heart size is stable. The sclerotic thoracic vertebral body lesion is not well seen by plain film. IMPRESSION: 1. Increasing markings in the left upper and lower lung suspicious for developing pneumonia. 2. Stable chronic fibrotic change as well. Electronically Signed   By: Ivar Drape M.D.   On: 03/24/2018 11:24   Ct Head Wo Contrast  Result Date: 03/30/2018 CLINICAL DATA:  In cephalopathy. History of metastatic lung cancer. EXAM: CT HEAD WITHOUT CONTRAST TECHNIQUE: Contiguous axial images were obtained from the base of the skull through the vertex without intravenous contrast. COMPARISON:  Brain MRI 02/24/2018 FINDINGS: Brain: Small focus of low density in the medial right occipital lobe corresponds to a previously treated metastasis. No intracranial mass effect, hemorrhage, acute infarct, or extra-axial fluid collection is identified. Mild cerebral atrophy is within normal limits for age. Vascular: Calcified atherosclerosis at the skull base. No hyperdense vessel. Skull: No fracture or focal osseous lesion. Sinuses/Orbits: Visualized paranasal sinuses and mastoid air cells are clear. Visualized orbits are unremarkable. Other: None. IMPRESSION: No evidence of acute intracranial abnormality. Electronically Signed   By: Logan Bores M.D.   On: 03/30/2018 11:01   Ct Angio Chest Pe W Or Wo Contrast  Result Date: 03/24/2018 CLINICAL DATA:  Pt sent from the [facility] with c/o chest pain for the past couple of days with fever. States O2 sats were low and placed pt on 2L [place] PTA. Patient with lung carcinoma. Was on chemotherapy at the time of the CT dated 02/04/2018. EXAM:  CT ANGIOGRAPHY CHEST WITH CONTRAST TECHNIQUE: Multidetector CT imaging of the chest was performed using the standard protocol during bolus administration of intravenous contrast. Multiplanar CT image reconstructions and MIPs were obtained to evaluate the vascular anatomy. CONTRAST:  27mL  ISOVUE-370 IOPAMIDOL (ISOVUE-370) INJECTION 76% COMPARISON:  Current chest radiograph, and chest CTs 11/05/2017 and 02/04/2018. FINDINGS: Cardiovascular: There is satisfactory opacification of the pulmonary arteries to the segmental level. There is no evidence of a pulmonary embolism. Heart is normal in size. No pericardial effusion. Minor left coronary artery calcifications. Aorta is normal in caliber. No dissection. Minimal distal descending thoracic atherosclerosis. Mild atherosclerosis at the origin of the arch branch vessels without significant stenosis. Mediastinum/Nodes: Mildly enlarged, shotty left para carinal lymph node measuring 12 mm short axis, 16 mm on the exam dated 02/04/2018. Right subcarinal node measuring 13 mm in short axis, previously 13 mm. No new prominent or enlarged lymph nodes. No mediastinal masses. No neck base or axillary masses or enlarged lymph nodes. Soft tissue thickening along the right hilar structures is unchanged from the most recent prior study. No discrete hilar masses or enlarged lymph nodes. Lungs/Pleura: There are new areas of bilateral airspace disease. Ground-glass opacities and patchy peribronchovascular consolidation is noted in the left upper lobe. Ground-glass opacity with dependent consolidation is noted in the posterior left lower lobe extending to the lung base. There is left lower lobe peribronchovascular consolidation, most confluent at posterior lung base. Bilateral peribronchial thickening is noted similar to the most recent prior study. Other findings seen previously including soft tissue thickening along the right upper lobe bronchus and areas of nodular opacity are stable from the most recent prior exam consistent with the patient's known carcinoma. There is stable interstitial thickening most evident in the lower lungs. Trace left pleural effusion.  No pneumothorax. Upper Abdomen: No acute abnormality. Musculoskeletal: Stable 13 mm colonic lesion in T4. No  new bone lesions. No fracture or acute finding. Review of the MIP images confirms the above findings. IMPRESSION: 1. No evidence of a pulmonary embolism. 2. Bilateral areas of ground-glass and more confluent lung opacity consistent with multifocal infection or inflammation, new since the prior chest, abdomen and pelvis CT from 02/04/2018. 3. Findings of lung carcinoma described previously are without significant change. Aortic Atherosclerosis (ICD10-I70.0) and Emphysema (ICD10-J43.9). Electronically Signed   By: Lajean Manes M.D.   On: 03/24/2018 13:11   Dg Chest Port 1 View  Result Date: 03/27/2018 CLINICAL DATA:  Patient with cough. EXAM: PORTABLE CHEST 1 VIEW COMPARISON:  Chest radiograph 03/24/2018 FINDINGS: Stable cardiac and mediastinal contours. Similar-appearing left lung coarse interstitial opacities. Unchanged heterogeneous opacities right lung base. No pleural effusion or pneumothorax. IMPRESSION: 1. Similar-appearing left lung coarse interstitial opacities which may represent infection. Followup PA and lateral chest X-ray is recommended in 3-4 weeks following trial of antibiotic therapy to ensure resolution and exclude underlying malignancy. 2. Unchanged heterogeneous opacities right lung base which may represent atelectasis or infection. Electronically Signed   By: Lovey Newcomer M.D.   On: 03/27/2018 10:23   Dg Chest Port 1 View  Result Date: 03/05/2018 CLINICAL DATA:  Cough, metastatic lung cancer EXAM: PORTABLE CHEST 1 VIEW COMPARISON:  Chest radiograph from one day prior. FINDINGS: Stable cardiomediastinal silhouette with normal heart size. No pneumothorax. No pleural effusion. Extensive patchy reticulonodular opacities throughout both lungs have not appreciably changed. No acute superimposed consolidative airspace disease. IMPRESSION: Stable extensive patchy reticulonodular opacities throughout both lungs, favor lymphangitic tumor. No acute superimposed consolidative airspace disease.  Electronically Signed   By: Ilona Sorrel M.D.   On: 03/05/2018 21:59    PERFORMANCE STATUS (ECOG) : 3 - Symptomatic, >50% confined to bed  Review of Systems As noted above. Otherwise, a complete review of systems is negative.  Physical Exam General: NAD, frail appearing, thin Cardiovascular: regular rate and rhythm Pulmonary: clear ant/post fields, on O2 Abdomen: soft, nontender, + bowel sounds Extremities: no edema, no joint deformities Skin: no rashes Neurological: Weakness but otherwise nonfocal  IMPRESSION: Weekend notes reviewed. Plan for discharge today to [facility] in [place].   Patient feeling much improved. He has been ambulating in the halls. No acute symptoms, changes, or concerns.   Patient would [facility] from [facility] following at rehab. I will also plan to follow up with him in the clinic.    PLAN: Continue morphine prn for comfort Palliative care to follow at rehab Will also follow in our clinic  Time Total: 15 minutes  Visit consisted of counseling and education dealing with the complex and emotionally intense issues of symptom management and palliative care in the setting of serious and potentially life-threatening illness.Greater than 50%  of this time was spent counseling and coordinating care related to the above assessment and plan.  Signed by: Altha Harm, PhD, DNP, NP-C, [facility] [phone] (Work Cell)

## 2018-04-04 NOTE — Progress Notes (Addendum)
Report provided to James [name], assigned RN at [facility]. EMS non emergent transport requested to provided transportation. Pt VS stable,. Pt reports no questions or concerns with plan of care. All questions addressed with facility nurse prior to close of report.

## 2018-04-04 NOTE — Discharge Summary (Signed)
[employer] [facility] [place] at [facility], 62 y.o., DOB Jun 15, 1955, MRN [id number]. 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  Patient was admitted with pneumonia and COPD exacerbation.  She was slow to improve.  Patient was treated with IV antibiotics and steroids he was seen in consultation by infectious disease pulmonary and oncology.  He is doing better.  He needs rehab.  Patient is to continue prednisone 50 mg daily for 2 weeks then his oncologist will taper the prednisone.  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 Hx of COPD and collapsed lung- 2012. Admitted to hospital 03/24/2018 with fevers and hypoxic respiratory failure. Former smoker. EXAM: CHEST - 2 VIEW COMPARISON:  03/30/2018 FINDINGS: Coarse somewhat nodular airspace opacities throughout anterior left upper lobe and lingula, left lung base, and in the right infrahilar region, with slight worsening since previous  exam. Heart size and mediastinal contours are within normal limits. There is blunting of posterior costophrenic angles suggesting small effusions. Visualized bones unremarkable. IMPRESSION: 1. Slight worsening of asymmetric airspace disease. 2. Possible small pleural effusions. Electronically Signed   By: James [name] M.D.   On: 04/02/2018 10:19   Dg Chest 2 View  Result Date: 03/30/2018 CLINICAL DATA:  Fever and shortness of breath for the past 2 days with clinical pneumonia. 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 EXAM: CHEST - 2 VIEW COMPARISON:  Portable chest x-ray of March 27, 2018 FINDINGS: The lungs are adequately inflated. There has been further interval increase in the interstitial infiltrates in the left lung. There is subtle increased density in the right upper lobe which is stable. There is no mediastinal shift. The heart and pulmonary vascularity are normal. The bony thorax exhibits no acute abnormality. IMPRESSION: Slightly increased interstitial densities throughout the left lung worrisome for pneumonia. Fairly stable density in the right pulmonary apex. No no overt CHF. Electronically Signed   By: James  [name] M.D.   On: 03/30/2018 09:03   Dg Chest 2 View  Result Date: 03/24/2018 CLINICAL DATA:  Short of breath, cough, history of lung carcinoma with metastasis to the liver and brain EXAM: CHEST - 2 VIEW COMPARISON:  Portable chest x-ray of 03/05/2018 and CT chest of 02/04/2018 FINDINGS: There is more opacity now present within the left mid and lower lung suspicious for developing pneumonia. Somewhat coarse and prominent markings remain bilaterally as well. Mediastinal and hilar contours are unchanged and heart size is stable. The sclerotic thoracic vertebral body lesion is not  well seen by plain film. IMPRESSION: 1. Increasing markings in the left upper and lower lung suspicious for developing pneumonia. 2. Stable chronic fibrotic change as well.  Electronically Signed   By: James [name] M.D.   On: 03/24/2018 11:24   Ct Head Wo Contrast  Result Date: 03/30/2018 CLINICAL DATA:  In cephalopathy. History of metastatic lung cancer. EXAM: CT HEAD WITHOUT CONTRAST TECHNIQUE: Contiguous axial images were obtained from the base of the skull through the vertex without intravenous contrast. COMPARISON:  Brain MRI 02/24/2018 FINDINGS: Brain: Small focus of low density in the medial right occipital lobe corresponds to a previously treated metastasis. No intracranial mass effect, hemorrhage, acute infarct, or extra-axial fluid collection is identified. Mild cerebral atrophy is within normal limits for age. Vascular: Calcified atherosclerosis at the skull base. No hyperdense vessel. Skull: No fracture or focal osseous lesion. Sinuses/Orbits: Visualized paranasal sinuses and mastoid air cells are clear. Visualized orbits are unremarkable. Other: None. IMPRESSION: No evidence of acute intracranial abnormality. Electronically Signed   By: James [name] M.D.   On: 03/30/2018 11:01   Ct Angio Chest Pe W Or Wo Contrast  Result Date: 03/24/2018 CLINICAL DATA:  Pt sent from the [facility] with c/o chest pain for the past couple of days with fever. States O2 sats were low and placed pt on 2L [place] PTA. Patient with lung carcinoma. Was on chemotherapy at the time of the CT dated 02/04/2018. EXAM: CT ANGIOGRAPHY CHEST WITH CONTRAST TECHNIQUE: Multidetector CT imaging of the chest was performed using the standard protocol during bolus administration of intravenous contrast. Multiplanar CT image reconstructions and MIPs were obtained to evaluate the vascular anatomy. CONTRAST:  35mL ISOVUE-370 IOPAMIDOL (ISOVUE-370) INJECTION 76% COMPARISON:  Current chest radiograph, and chest CTs 11/05/2017 and 02/04/2018. FINDINGS: Cardiovascular: There is satisfactory opacification of the pulmonary arteries to the segmental level. There is no evidence of a pulmonary embolism. Heart is normal in  size. No pericardial effusion. Minor left coronary artery calcifications. Aorta is normal in caliber. No dissection. Minimal distal descending thoracic atherosclerosis. Mild atherosclerosis at the origin of the arch branch vessels without significant stenosis. Mediastinum/Nodes: Mildly enlarged, shotty left para carinal lymph node measuring 12 mm short axis, 16 mm on the exam dated 02/04/2018. Right subcarinal node measuring 13 mm in short axis, previously 13 mm. No new prominent or enlarged lymph nodes. No mediastinal masses. No neck base or axillary masses or enlarged lymph nodes. Soft tissue thickening along the right hilar structures is unchanged from the most recent prior study. No discrete hilar masses or enlarged lymph nodes. Lungs/Pleura: There are new areas of bilateral airspace disease. Ground-glass opacities and patchy peribronchovascular consolidation is noted in the left upper lobe. Ground-glass opacity with dependent consolidation is noted in the posterior left lower lobe extending to the lung base. There is left lower lobe peribronchovascular consolidation, most confluent at posterior lung base. Bilateral peribronchial thickening is noted similar to the most recent prior study. Other findings seen previously including soft tissue thickening along the right upper lobe bronchus and areas of nodular opacity are stable from the most recent prior exam consistent with the patient's known carcinoma. There is stable interstitial thickening most evident in the lower lungs. Trace left pleural effusion.  No pneumothorax. Upper Abdomen: No acute abnormality. Musculoskeletal: Stable 13 mm colonic lesion in T4. No new bone lesions. No fracture or acute finding. Review of the MIP images confirms the above findings. IMPRESSION: 1. No evidence of a pulmonary embolism. 2. Bilateral areas  of ground-glass and more confluent lung opacity consistent with multifocal infection or inflammation, new since the prior chest,  abdomen and pelvis CT from 02/04/2018. 3. Findings of lung carcinoma described previously are without significant change. Aortic Atherosclerosis (ICD10-I70.0) and Emphysema (ICD10-J43.9). Electronically Signed   By: Lajean Manes M.D.   On: 03/24/2018 13:11   Dg Chest Port 1 View  Result Date: 03/27/2018 CLINICAL DATA:  Patient with cough. EXAM: PORTABLE CHEST 1 VIEW COMPARISON:  Chest radiograph 03/24/2018 FINDINGS: Stable cardiac and mediastinal contours. Similar-appearing left lung coarse interstitial opacities. Unchanged heterogeneous opacities right lung base. No pleural effusion or pneumothorax. IMPRESSION: 1. Similar-appearing left lung coarse interstitial opacities which may represent infection. Followup PA and lateral chest X-ray is recommended in 3-4 weeks following trial of antibiotic therapy to ensure resolution and exclude underlying malignancy. 2. Unchanged heterogeneous opacities right lung base which may represent atelectasis or infection. Electronically Signed   By: Lovey Newcomer M.D.   On: 03/27/2018 10:23   Dg Chest Port 1 View  Result Date: 03/05/2018 CLINICAL DATA:  Cough, metastatic lung cancer EXAM: PORTABLE CHEST 1 VIEW COMPARISON:  Chest radiograph from one day prior. FINDINGS: Stable cardiomediastinal silhouette with normal heart size. No pneumothorax. No pleural effusion. Extensive patchy reticulonodular opacities throughout both lungs have not appreciably changed. No acute superimposed consolidative airspace disease. IMPRESSION: Stable extensive patchy reticulonodular opacities throughout both lungs, favor lymphangitic tumor. No acute superimposed consolidative airspace disease. Electronically Signed   By: Ilona Sorrel M.D.   On: 03/05/2018 21:59       Today   Subjective:   James [name] patient doing better breathing improved  Objective:   Blood pressure 108/76, pulse 76, temperature 97.7 F (36.5 C), temperature source Oral, resp. rate 16, height 5\' 10"  (1.778 m),  weight 61.4 kg, SpO2 97 %.  .  Intake/Output Summary (Last 24 hours) at 04/04/2018 0904 Last data filed at 04/04/2018 0622 Gross per 24 hour  Intake 85 ml  Output 900 ml  Net -815 ml    Exam VITAL SIGNS: Blood pressure 108/76, pulse 76, temperature 97.7 F (36.5 C), temperature source Oral, resp. rate 16, height 5\' 10"  (1.778 m), weight 61.4 kg, SpO2 97 %.  GENERAL:  62 y.o.-year-old patient lying in the bed with no acute distress.  EYES: Pupils equal, round, reactive to light and accommodation. No scleral icterus. Extraocular muscles intact.  HEENT: Head atraumatic, normocephalic. Oropharynx and nasopharynx clear.  NECK:  Supple, no jugular venous distention. No thyroid enlargement, no tenderness.  LUNGS: Normal breath sounds bilaterally, no wheezing, rales,rhonchi or crepitation. No use of accessory muscles of respiration.  CARDIOVASCULAR: S1, S2 normal. No murmurs, rubs, or gallops.  ABDOMEN: Soft, nontender, nondistended. Bowel sounds present. No organomegaly or mass.  EXTREMITIES: No pedal edema, cyanosis, or clubbing.  NEUROLOGIC: Cranial nerves II through XII are intact. Muscle strength 5/5 in all extremities. Sensation intact. Gait not checked.  PSYCHIATRIC: The patient is alert and oriented x 3.  SKIN: No obvious rash, lesion, or ulcer.   Data Review     CBC w Diff:  Lab Results  Component Value Date   WBC 7.6 04/01/2018   HGB 11.3 (L) 04/01/2018   HGB 13.3 08/24/2017   HCT 34.3 (L) 04/01/2018   HCT 40.5 08/24/2017   PLT 228 04/01/2018   PLT 268 08/24/2017   LYMPHOPCT 7 03/24/2018   MONOPCT 13 03/24/2018   EOSPCT 1 03/24/2018   BASOPCT 1 03/24/2018   CMP:  Lab Results  Component Value  Date   NA 137 03/30/2018   NA 138 08/24/2017   NA 137 04/23/2013   K 4.5 03/30/2018   K 3.8 04/23/2013   CL 106 03/30/2018   CL 105 04/23/2013   CO2 22 03/30/2018   CO2 22 04/23/2013   BUN 23 03/30/2018   BUN 18 08/24/2017   BUN 18 04/23/2013   CREATININE 0.63  03/31/2018   CREATININE 0.90 04/23/2013   PROT 7.2 03/24/2018   PROT 6.8 08/24/2017   PROT 7.5 04/23/2013   ALBUMIN 3.0 (L) 03/24/2018   ALBUMIN 4.2 08/24/2017   ALBUMIN 3.8 04/23/2013   BILITOT 0.8 03/24/2018   BILITOT 0.2 08/24/2017   BILITOT 0.6 04/23/2013   ALKPHOS 63 03/24/2018   ALKPHOS 64 04/23/2013   AST 42 (H) 03/24/2018   AST 11 (L) 04/23/2013   ALT 43 03/24/2018   ALT 18 04/23/2013  .  Micro Results No results found for this or any previous visit (from the past 240 hour(s)).      Code Status Orders  (From admission, onward)         Start     Ordered   03/24/18 1216  Do not attempt resuscitation (DNR)  Continuous    Question Answer Comment  In the event of cardiac or respiratory ARREST Do not call a "code blue"   In the event of cardiac or respiratory ARREST Do not perform Intubation, CPR, defibrillation or ACLS   In the event of cardiac or respiratory ARREST Use medication by any route, position, wound care, and other measures to relive pain and suffering. May use oxygen, suction and manual treatment of airway obstruction as needed for comfort.      03/24/18 1216        Code Status History    Date Active Date Inactive Code Status Order ID Comments User Context   03/05/2018 0225 03/07/2018 1725 Full Code [id number]  Lance Coon, MD ED   04/09/2017 1103 04/11/2017 1427 Full Code [id number]  Mcarthur Rossetti, MD Inpatient    Advance Directive Documentation     Most Recent Value  Type of Advance Directive  Living will, Healthcare Power of Attorney  Pre-existing out of facility DNR order (yellow form or pink MOST form)  -  "MOST" Form in Place?  -           Contact information for follow-up providers    Cammie Sickle, MD. Go on 04/08/2018.   Specialties:  Internal Medicine, Oncology Why:  @ 8:00 - Labs and Dr. Visit Contact information: [place] [address] [phone]            Contact information for  after-discharge care    Destination    [facility]erred [facility] .   Service:  Skilled Nursing Contact information: [address] [place] [phone]                  Discharge Medications   Allergies as of 04/04/2018   No Known Allergies     Medication List    STOP taking these medications   benzonatate 200 MG capsule Commonly known as:  TESSALON   Glycopyrrolate-Formoterol 9-4.8 MCG/ACT Aero   guaiFENesin-dextromethorphan 100-10 MG/5ML syrup Commonly known as:  ROBITUSSIN DM   ipratropium-albuterol 0.5-2.5 (3) MG/3ML Soln Commonly known as:  DUONEB   potassium chloride SA 20 MEQ tablet Commonly known as:  K-DUR,KLOR-CON     TAKE these medications   albuterol 108 (90  Base) MCG/ACT inhaler Commonly known as:  PROVENTIL HFA;VENTOLIN HFA Inhale 1-2 puffs into the lungs every 6 (six) hours as needed for wheezing or shortness of breath.   budesonide 0.25 MG/2ML nebulizer solution Commonly known as:  PULMICORT Take 2 mLs (0.25 mg total) by nebulization 2 (two) times daily.   diltiazem 30 MG tablet Commonly known as:  CARDIZEM Take 1 tablet (30 mg total) by mouth every 8 (eight) hours.   guaiFENesin 600 MG 12 hr tablet Commonly known as:  MUCINEX Take 1 tablet (600 mg total) by mouth 2 (two) times daily.   levalbuterol 1.25 MG/0.5ML nebulizer solution Commonly known as:  XOPENEX Take 0.63 mg by nebulization every 6 (six) hours as needed for wheezing or shortness of breath.   LORazepam 1 MG tablet Commonly known as:  ATIVAN Take 1 tablet (1 mg total) by mouth at bedtime as needed for anxiety.   metoprolol tartrate 25 MG tablet Commonly known as:  LOPRESSOR Take 0.5 tablets (12.5 mg total) by mouth 2 (two) times daily.   ondansetron 8 MG tablet Commonly known as:  ZOFRAN One pill every 8 hours as needed for nausea/vomitting. What changed:    how much to take  how to take this  when to take  this  reasons to take this   predniSONE 50 MG tablet Commonly known as:  DELTASONE Take 50mg  po daily x 14 days   prochlorperazine 10 MG tablet Commonly known as:  COMPAZINE Take 1 tablet (10 mg total) by mouth every 6 (six) hours as needed for nausea or vomiting.   risperiDONE 0.5 MG disintegrating tablet Commonly known as:  RISPERDAL M-TABS Take 1 tablet (0.5 mg total) by mouth at bedtime.   SPIRIVA HANDIHALER 18 MCG inhalation capsule Generic drug:  tiotropium INHALE 1 CAPSULE VIA HANDIHALER ONCE DAILY AT THE SAME TIME EVERY DAY   sulfamethoxazole-trimethoprim 800-160 MG tablet Commonly known as:  BACTRIM DS,SEPTRA DS Take 1 tablet by mouth daily for 14 days.   traZODone 50 MG tablet Commonly known as:  DESYREL Take 1 tablet (50 mg total) by mouth at bedtime as needed for sleep (anxiety).            Durable Medical Equipment  (From admission, onward)         Start     Ordered   04/04/18 0900  DME Oxygen  Once    Question Answer Comment  Mode or (Route) Nasal cannula   Liters per Minute 2   Oxygen delivery system Gas      04/04/18 0900             Total Time in preparing paper work, data evaluation and todays exam - 32 minutes  Dustin Flock M.D on 04/04/2018 at 9:04 AM [place]  [phone]

## 2018-04-04 NOTE — Progress Notes (Signed)
Order sent for palliative care at [facility].

## 2018-04-05 LAB — ASPERGILLUS ANTIGEN, BAL/SERUM: Aspergillus Ag, BAL/Serum: 0.03 Index (ref 0.00–0.49)

## 2018-04-08 ENCOUNTER — Inpatient Hospital Stay (HOSPITAL_BASED_OUTPATIENT_CLINIC_OR_DEPARTMENT_OTHER): Payer: BC Managed Care – PPO | Admitting: Hospice and Palliative Medicine

## 2018-04-08 ENCOUNTER — Ambulatory Visit
Admission: RE | Admit: 2018-04-08 | Discharge: 2018-04-08 | Disposition: A | Discharge status: Home / Self Care | Payer: BC Managed Care – PPO | Source: Ambulatory Visit | Attending: Internal Medicine | Admitting: Internal Medicine

## 2018-04-08 ENCOUNTER — Other Ambulatory Visit: Payer: Self-pay

## 2018-04-08 ENCOUNTER — Inpatient Hospital Stay (HOSPITAL_BASED_OUTPATIENT_CLINIC_OR_DEPARTMENT_OTHER): Payer: BC Managed Care – PPO | Admitting: Internal Medicine

## 2018-04-08 ENCOUNTER — Inpatient Hospital Stay: Payer: BC Managed Care – PPO

## 2018-04-08 ENCOUNTER — Telehealth: Payer: Self-pay | Admitting: Internal Medicine

## 2018-04-08 VITALS — BP 119/80 | HR 112 | Temp 97.5°F | Resp 20 | Ht 70.0 in | Wt 123.8 lb

## 2018-04-08 DIAGNOSIS — Z9221 Personal history of antineoplastic chemotherapy: Secondary | ICD-10-CM | POA: Diagnosis not present

## 2018-04-08 DIAGNOSIS — C7931 Secondary malignant neoplasm of brain: Secondary | ICD-10-CM

## 2018-04-08 DIAGNOSIS — R059 Cough, unspecified: Secondary | ICD-10-CM

## 2018-04-08 DIAGNOSIS — C787 Secondary malignant neoplasm of liver and intrahepatic bile duct: Secondary | ICD-10-CM

## 2018-04-08 DIAGNOSIS — C7951 Secondary malignant neoplasm of bone: Secondary | ICD-10-CM | POA: Diagnosis not present

## 2018-04-08 DIAGNOSIS — Z79899 Other long term (current) drug therapy: Secondary | ICD-10-CM | POA: Insufficient documentation

## 2018-04-08 DIAGNOSIS — J449 Chronic obstructive pulmonary disease, unspecified: Secondary | ICD-10-CM | POA: Insufficient documentation

## 2018-04-08 DIAGNOSIS — C3411 Malignant neoplasm of upper lobe, right bronchus or lung: Secondary | ICD-10-CM

## 2018-04-08 DIAGNOSIS — Z87891 Personal history of nicotine dependence: Secondary | ICD-10-CM | POA: Diagnosis not present

## 2018-04-08 DIAGNOSIS — E46 Unspecified protein-calorie malnutrition: Secondary | ICD-10-CM

## 2018-04-08 DIAGNOSIS — Z515 Encounter for palliative care: Secondary | ICD-10-CM

## 2018-04-08 DIAGNOSIS — R05 Cough: Secondary | ICD-10-CM | POA: Insufficient documentation

## 2018-04-08 LAB — COMPREHENSIVE METABOLIC PANEL
ALT: 80 U/L — AB (ref 0–44)
AST: 44 U/L — AB (ref 15–41)
Albumin: 3.1 g/dL — ABNORMAL LOW (ref 3.5–5.0)
Alkaline Phosphatase: 93 U/L (ref 38–126)
Anion gap: 7 (ref 5–15)
BILIRUBIN TOTAL: 0.6 mg/dL (ref 0.3–1.2)
BUN: 20 mg/dL (ref 8–23)
CALCIUM: 8.8 mg/dL — AB (ref 8.9–10.3)
CO2: 23 mmol/L (ref 22–32)
CREATININE: 0.66 mg/dL (ref 0.61–1.24)
Chloride: 105 mmol/L (ref 98–111)
GFR calc non Af Amer: >60 mL/min (ref >60)
Glucose, Bld: 95 mg/dL (ref 70–99)
Potassium: 3.6 mmol/L (ref 3.5–5.1)
Sodium: 135 mmol/L (ref 135–145)
TOTAL PROTEIN: 6 g/dL — AB (ref 6.5–8.1)

## 2018-04-08 LAB — CBC WITH DIFFERENTIAL/PLATELET
ABS IMMATURE GRANULOCYTES: 0.07 10*3/uL (ref 0.00–0.07)
Basophils Absolute: 0 10*3/uL (ref 0.0–0.1)
Basophils Relative: 0 %
Eosinophils Absolute: 0.1 10*3/uL (ref 0.0–0.5)
Eosinophils Relative: 1 %
HCT: 35.1 % — ABNORMAL LOW (ref 39.0–52.0)
Hemoglobin: 12 g/dL — ABNORMAL LOW (ref 13.0–17.0)
IMMATURE GRANULOCYTES: 1 %
Lymphocytes Relative: 14 %
Lymphs Abs: 1.4 10*3/uL (ref 0.7–4.0)
MCH: 32.7 pg (ref 26.0–34.0)
MCHC: 34.2 g/dL (ref 30.0–36.0)
MCV: 95.6 fL (ref 80.0–100.0)
Monocytes Absolute: 0.8 10*3/uL (ref 0.1–1.0)
Monocytes Relative: 8 %
NRBC: 0 % (ref 0.0–0.2)
Neutro Abs: 7.5 10*3/uL (ref 1.7–7.7)
Neutrophils Relative %: 76 %
PLATELETS: 181 10*3/uL (ref 150–400)
RBC: 3.67 MIL/uL — AB (ref 4.22–5.81)
RDW: 14.4 % (ref 11.5–15.5)
WBC: 9.9 10*3/uL (ref 4.0–10.5)

## 2018-04-08 MED ORDER — MYCOPHENOLATE MOFETIL 500 MG PO TABS: 500.0000 mg | ORAL_TABLET | Freq: Two times a day (BID) | ORAL | 0 refills | Status: DC

## 2018-04-08 NOTE — Progress Notes (Signed)
[place]  Telephone:([phone] [phone]   Name: James [name] Date: 04/08/2018 MRN: [id number]  DOB: Oct 22, 1955  Patient Care Team: Valerie Roys, DO as PCP - General (Family Medicine) Telford Nab, RN as Registered Nurse Rogue Bussing, Elisha Headland, MD as Medical Oncologist (Medical Oncology)    REASON FOR CONSULTATION: Palliative Care consult requested for this 62 y.o. male with multiple medical problems including stage IV lung cancer last treated in October with chemotherapy/immunotherapy, history of tobacco abuse, and COPD, who was recently hospitalized 03/04/2018 to 03/07/2018 with pneumonia and nausea and vomiting after receiving chemotherapy.  He was hospitalized again 03/24/2018 to 04/04/18 with respiratory failure presumed to be secondary to multifocal pneumonia versus pneumonitis from immunotherapy. Palliative care consulted for goals.  SOCIAL HISTORY:    Patient is married to his wife of 81 years.  He lives at home.  He has no children.  He formerly worked in [employer].  ADVANCE DIRECTIVES:  Wife is his healthcare proxy  CODE STATUS: DNR  PAST MEDICAL HISTORY: Past Medical History:  Diagnosis Date  . Cancer ([place]) 12/01/2017   Primary cancer of right upper lobe of lung. Mets to liver, brain.  Marland Kitchen Prepatellar bursitis of left knee    patient denies    PAST SURGICAL HISTORY:  Past Surgical History:  Procedure Laterality Date  . collapsed lung  2012   "had to reinflate" " i carried large piece of sheet rock up stairs by myself "   . [place]   x2 , second was with mesh   . TOTAL HIP ARTHROPLASTY Right 04/09/2017   Procedure: RIGHT TOTAL HIP ARTHROPLASTY ANTERIOR APPROACH;  Surgeon: Mcarthur Rossetti, MD;  Location: [facility];  Service: Orthopedics;  Laterality: Right;  . URETHRAL STRICTURE DILATATION      HEMATOLOGY/ONCOLOGY HISTORY:  Oncology History   # July 2019- [facility]- RUL  [s/p Liver Bx]-CT chest- RUL/ liver/ brain solitary lesion.  to contralateral lung; pleura; liver and brain.July 2019- A/P- liver metastases at least 2; bone scan shows right humeral; left fifth rib; L2 vertebral body metastases.  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  --------------------------------------------------------------------   DIAGNOSIS: Adenocarcinoma lung  STAGE: 4       ;GOALS: Palliative  CURRENT/MOST RECENT THERAPY -CARBO+TAXOL+ATEZO+AVASTIN      Primary cancer of right upper lobe of lung ([place])   11/24/2017 -  Chemotherapy    The patient had bevacizumab (AVASTIN) 1,000 mg in sodium chloride 0.9 % 100 mL chemo infusion, 15 mg/kg = 1,000 mg, Intravenous,  Once, 4 of 4 cycles Administration: 1,000 mg (12/16/2017), 1,000 mg (01/19/2018), 1,000 mg (02/09/2018), 1,000 mg (03/03/2018)  for chemotherapy treatment.     12/09/2017 -  Chemotherapy    The patient had palonosetron (ALOXI) injection 0.25 mg, 0.25 mg, Intravenous,  Once, 4 of 4 cycles Administration: 0.25 mg (12/16/2017), 0.25 mg (01/19/2018), 0.25 mg (02/09/2018), 0.25 mg (03/03/2018) CARBOplatin (PARAPLATIN) 670 mg in sodium chloride 0.9 % 250 mL chemo infusion, 670 mg (100 % of original dose 667.8 mg), Intravenous,  Once, 4 of 4 cycles Dose modification:   (original dose 667.8 mg, Cycle 1, Reason: Provider Judgment) Administration: 670 mg (12/16/2017), 610 mg (01/19/2018), 610 mg (02/09/2018) PACLitaxel (TAXOL) 360 mg in sodium chloride 0.9 % 500 mL chemo infusion (> 80mg /m2), 200 mg/m2 = 360 mg, Intravenous,  Once, 4 of 4 cycles Administration: 360  mg (12/16/2017), 360 mg (01/19/2018), 360 mg (02/09/2018), 360 mg (03/03/2018) atezolizumab (TECENTRIQ) 1,200 mg in sodium chloride 0.9 % 250 mL chemo infusion, 1,200 mg, Intravenous, Once, 4 of 4 cycles Administration: 1,200 mg (12/16/2017), 1,200 mg (01/19/2018), 1,200 mg (02/09/2018),  1,200 mg (03/03/2018)  for chemotherapy treatment.      ALLERGIES:  has No Known Allergies.  MEDICATIONS:  Current Outpatient Medications  Medication Sig Dispense Refill  . albuterol (PROAIR HFA) 108 (90 Base) MCG/ACT inhaler Inhale 1-2 puffs into the lungs every 6 (six) hours as needed for wheezing or shortness of breath. 1 Inhaler 5  . budesonide (PULMICORT) 0.25 MG/2ML nebulizer solution Take 2 mLs (0.25 mg total) by nebulization 2 (two) times daily. 60 mL 12  . diltiazem (CARDIZEM) 30 MG tablet Take 1 tablet (30 mg total) by mouth every 8 (eight) hours.    Marland Kitchen guaiFENesin (MUCINEX) 600 MG 12 hr tablet Take 1 tablet (600 mg total) by mouth 2 (two) times daily.    Marland Kitchen levalbuterol (XOPENEX) 1.25 MG/0.5ML nebulizer solution Take 0.63 mg by nebulization every 6 (six) hours as needed for wheezing or shortness of breath. 1 each 12  . LORazepam (ATIVAN) 1 MG tablet Take 1 tablet (1 mg total) by mouth at bedtime as needed for anxiety. 30 tablet 0  . metoprolol tartrate (LOPRESSOR) 25 MG tablet Take 0.5 tablets (12.5 mg total) by mouth 2 (two) times daily. 60 tablet 0  . ondansetron (ZOFRAN) 8 MG tablet One pill every 8 hours as needed for nausea/vomitting. 40 tablet 1  . predniSONE (DELTASONE) 50 MG tablet Take 50mg  po daily x 14 days    . prochlorperazine (COMPAZINE) 10 MG tablet Take 1 tablet (10 mg total) by mouth every 6 (six) hours as needed for nausea or vomiting. 40 tablet 1  . risperiDONE (RISPERDAL M-TABS) 0.5 MG disintegrating tablet Take 1 tablet (0.5 mg total) by mouth at bedtime. 30 tablet 0  . SPIRIVA HANDIHALER 18 MCG inhalation capsule INHALE 1 CAPSULE VIA HANDIHALER ONCE DAILY AT THE SAME TIME EVERY DAY 30 capsule 0  . sulfamethoxazole-trimethoprim (BACTRIM DS,SEPTRA DS) 800-160 MG tablet Take 1 tablet by mouth daily for 14 days. 14 tablet 0  . traZODone (DESYREL) 50 MG tablet Take 1 tablet (50 mg total) by mouth at bedtime as needed for sleep (anxiety). 30 tablet 0   No current  facility-administered medications for this visit.     VITAL SIGNS: There were no vitals taken for this visit. There were no vitals filed for this visit.  Estimated body mass index is 17.76 kg/m as calculated from the following:   Height as of an earlier encounter on 04/08/18: 5\' 10"  (1.778 m).   Weight as of an earlier encounter on 04/08/18: 123 lb 12.8 oz (56.2 kg).  LABS: CBC:    Component Value Date/Time   WBC 9.9 04/08/2018 0805   HGB 12.0 (L) 04/08/2018 0805   HGB 13.3 08/24/2017 1651   HCT 35.1 (L) 04/08/2018 0805   HCT 40.5 08/24/2017 1651   PLT 181 04/08/2018 0805   PLT 268 08/24/2017 1651   MCV 95.6 04/08/2018 0805   MCV 99 (H) 08/24/2017 1651   MCV 96 04/23/2013 1330   NEUTROABS 7.5 04/08/2018 0805   NEUTROABS 3.8 08/24/2017 1651   LYMPHSABS 1.4 04/08/2018 0805   LYMPHSABS 1.5 08/24/2017 1651   MONOABS 0.8 04/08/2018 0805   EOSABS 0.1 04/08/2018 0805   EOSABS 0.3 08/24/2017 1651   BASOSABS 0.0 04/08/2018 0805   BASOSABS 0.0 08/24/2017  1651   Comprehensive Metabolic Panel:    Component Value Date/Time   NA 135 04/08/2018 0805   NA 138 08/24/2017 1651   NA 137 04/23/2013 1330   K 3.6 04/08/2018 0805   K 3.8 04/23/2013 1330   CL 105 04/08/2018 0805   CL 105 04/23/2013 1330   CO2 23 04/08/2018 0805   CO2 22 04/23/2013 1330   BUN 20 04/08/2018 0805   BUN 18 08/24/2017 1651   BUN 18 04/23/2013 1330   CREATININE 0.66 04/08/2018 0805   CREATININE 0.90 04/23/2013 1330   GLUCOSE 95 04/08/2018 0805   GLUCOSE 120 (H) 04/23/2013 1330   CALCIUM 8.8 (L) 04/08/2018 0805   CALCIUM 9.3 04/23/2013 1330   AST 44 (H) 04/08/2018 0805   AST 11 (L) 04/23/2013 1330   ALT 80 (H) 04/08/2018 0805   ALT 18 04/23/2013 1330   ALKPHOS 93 04/08/2018 0805   ALKPHOS 64 04/23/2013 1330   BILITOT 0.6 04/08/2018 0805   BILITOT 0.2 08/24/2017 1651   BILITOT 0.6 04/23/2013 1330   PROT 6.0 (L) 04/08/2018 0805   PROT 6.8 08/24/2017 1651   PROT 7.5 04/23/2013 1330   ALBUMIN 3.1 (L)  04/08/2018 0805   ALBUMIN 4.2 08/24/2017 1651   ALBUMIN 3.8 04/23/2013 1330    RADIOGRAPHIC STUDIES: Dg Chest 2 View  Result Date: 04/02/2018 CLINICAL DATA:  Stage IV lung cancer of right upper lobe with mets to liver and brain. Hx of COPD and collapsed lung- 2012. Admitted to hospital 03/24/2018 with fevers and hypoxic respiratory failure. Former smoker. EXAM: CHEST - 2 VIEW COMPARISON:  03/30/2018 FINDINGS: Coarse somewhat nodular airspace opacities throughout anterior left upper lobe and lingula, left lung base, and in the right infrahilar region, with slight worsening since previous exam. Heart size and mediastinal contours are within normal limits. There is blunting of posterior costophrenic angles suggesting small effusions. Visualized bones unremarkable. IMPRESSION: 1. Slight worsening of asymmetric airspace disease. 2. Possible small pleural effusions. Electronically Signed   By: Lucrezia Europe M.D.   On: 04/02/2018 10:19   Dg Chest 2 View  Result Date: 03/30/2018 CLINICAL DATA:  Fever and shortness of breath for the past 2 days with clinical pneumonia. 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 EXAM: CHEST - 2 VIEW COMPARISON:  Portable chest x-ray of March 27, 2018 FINDINGS: The lungs are adequately inflated. There has been further interval increase in the interstitial infiltrates in the left lung. There is subtle increased density in the right upper lobe which is stable. There is no mediastinal shift. The heart and pulmonary vascularity are normal. The bony thorax exhibits no acute abnormality. IMPRESSION: Slightly increased interstitial densities throughout the left lung worrisome for pneumonia. Fairly stable density in the right pulmonary apex. No no overt CHF. Electronically Signed   By: David  Martinique M.D.   On: 03/30/2018 09:03   Dg Chest 2 View  Result Date: 03/24/2018 CLINICAL DATA:  Short of breath, cough, history of lung carcinoma with metastasis to the  liver and brain EXAM: CHEST - 2 VIEW COMPARISON:  Portable chest x-ray of 03/05/2018 and CT chest of 02/04/2018 FINDINGS: There is more opacity now present within the left mid and lower lung suspicious for developing pneumonia. Somewhat coarse and prominent markings remain bilaterally as well. Mediastinal and hilar contours are unchanged and heart size is stable. The sclerotic thoracic vertebral body lesion is not well seen by plain film. IMPRESSION: 1. Increasing markings in the left upper and  lower lung suspicious for developing pneumonia. 2. Stable chronic fibrotic change as well. Electronically Signed   By: Ivar Drape M.D.   On: 03/24/2018 11:24   Ct Head Wo Contrast  Result Date: 03/30/2018 CLINICAL DATA:  In cephalopathy. History of metastatic lung cancer. EXAM: CT HEAD WITHOUT CONTRAST TECHNIQUE: Contiguous axial images were obtained from the base of the skull through the vertex without intravenous contrast. COMPARISON:  Brain MRI 02/24/2018 FINDINGS: Brain: Small focus of low density in the medial right occipital lobe corresponds to a previously treated metastasis. No intracranial mass effect, hemorrhage, acute infarct, or extra-axial fluid collection is identified. Mild cerebral atrophy is within normal limits for age. Vascular: Calcified atherosclerosis at the skull base. No hyperdense vessel. Skull: No fracture or focal osseous lesion. Sinuses/Orbits: Visualized paranasal sinuses and mastoid air cells are clear. Visualized orbits are unremarkable. Other: None. IMPRESSION: No evidence of acute intracranial abnormality. Electronically Signed   By: Logan Bores M.D.   On: 03/30/2018 11:01   Ct Angio Chest Pe W Or Wo Contrast  Result Date: 03/24/2018 CLINICAL DATA:  [facility] with c/o chest pain for the past couple of days with fever. States O2 sats were low and placed pt on 2L [place] PTA. Patient with lung carcinoma. Was on chemotherapy at the time of the CT dated 02/04/2018. EXAM: CT  ANGIOGRAPHY CHEST WITH CONTRAST TECHNIQUE: Multidetector CT imaging of the chest was performed using the standard protocol during bolus administration of intravenous contrast. Multiplanar CT image reconstructions and MIPs were obtained to evaluate the vascular anatomy. CONTRAST:  54mL ISOVUE-370 IOPAMIDOL (ISOVUE-370) INJECTION 76% COMPARISON:  Current chest radiograph, and chest CTs 11/05/2017 and 02/04/2018. FINDINGS: Cardiovascular: There is satisfactory opacification of the pulmonary arteries to the segmental level. There is no evidence of a pulmonary embolism. Heart is normal in size. No pericardial effusion. Minor left coronary artery calcifications. Aorta is normal in caliber. No dissection. Minimal distal descending thoracic atherosclerosis. Mild atherosclerosis at the origin of the arch branch vessels without significant stenosis. Mediastinum/Nodes: Mildly enlarged, shotty left para carinal lymph node measuring 12 mm short axis, 16 mm on the exam dated 02/04/2018. Right subcarinal node measuring 13 mm in short axis, previously 13 mm. No new prominent or enlarged lymph nodes. No mediastinal masses. No neck base or axillary masses or enlarged lymph nodes. Soft tissue thickening along the right hilar structures is unchanged from the most recent prior study. No discrete hilar masses or enlarged lymph nodes. Lungs/Pleura: There are new areas of bilateral airspace disease. Ground-glass opacities and patchy peribronchovascular consolidation is noted in the left upper lobe. Ground-glass opacity with dependent consolidation is noted in the posterior left lower lobe extending to the lung base. There is left lower lobe peribronchovascular consolidation, most confluent at posterior lung base. Bilateral peribronchial thickening is noted similar to the most recent prior study. Other findings seen previously including soft tissue thickening along the right upper lobe bronchus and areas of nodular opacity are stable from  the most recent prior exam consistent with the patient's known carcinoma. There is stable interstitial thickening most evident in the lower lungs. Trace left pleural effusion.  No pneumothorax. Upper Abdomen: No acute abnormality. Musculoskeletal: Stable 13 mm colonic lesion in T4. No new bone lesions. No fracture or acute finding. Review of the MIP images confirms the above findings. IMPRESSION: 1. No evidence of a pulmonary embolism. 2. Bilateral areas of ground-glass and more confluent lung opacity consistent with multifocal infection or inflammation, new  since the prior chest, abdomen and pelvis CT from 02/04/2018. 3. Findings of lung carcinoma described previously are without significant change. Aortic Atherosclerosis (ICD10-I70.0) and Emphysema (ICD10-J43.9). Electronically Signed   By: Lajean Manes M.D.   On: 03/24/2018 13:11   Dg Chest Port 1 View  Result Date: 03/27/2018 CLINICAL DATA:  Patient with cough. EXAM: PORTABLE CHEST 1 VIEW COMPARISON:  Chest radiograph 03/24/2018 FINDINGS: Stable cardiac and mediastinal contours. Similar-appearing left lung coarse interstitial opacities. Unchanged heterogeneous opacities right lung base. No pleural effusion or pneumothorax. IMPRESSION: 1. Similar-appearing left lung coarse interstitial opacities which may represent infection. Followup PA and lateral chest X-ray is recommended in 3-4 weeks following trial of antibiotic therapy to ensure resolution and exclude underlying malignancy. 2. Unchanged heterogeneous opacities right lung base which may represent atelectasis or infection. Electronically Signed   By: Lovey Newcomer M.D.   On: 03/27/2018 10:23    PERFORMANCE STATUS (ECOG) : 3 - Symptomatic, >50% confined to bed  Review of Systems As noted above. Otherwise, a complete review of systems is negative.  Physical Exam General: NAD, frail appearing, thin, in wheelchair Cardiovascular: regular rate and rhythm Pulmonary: poor air movement without wheeze,  on O2 Abdomen: soft, nontender, + bowel sounds GU: no suprapubic tenderness Extremities: no edema, no joint deformities Skin: no rashes Neurological: Weakness but otherwise nonfocal  IMPRESSION: Follow up clinic visit after recent hospitalization.  Patient is accompanied to visit by his wife.  Patient has been at rehab.  He feels like he is making slow progress.  He is working with PT/OT/ST.  Oxygen requirements are generally lower than they were when he was in the hospital.  Patient says he is generally requiring 1 to 2 L of O2.  He still functionally debilitated mostly in the wheelchair or bed.  It is unclear if patient will return to his previous functional baseline.  Chest x-ray has been ordered by Dr. Rogue Bussing.    Patient is severely malnourished.  He is down 123 pounds today from 146 pounds a month ago.  Magic cup oral supplements 3 times daily added to [facility].  He also needs to follow-up with our dietitian.  Patient was sent back to the facility with a case of Ensure.  We discussed high-protein, high-calorie meals.  Patient plans to return home early next week.  I suspect he would benefit from continued involvement of home health.   PLAN: Continue rehab care Follow-up chest x-ray per Dr. Rogue Bussing Increase oral supplements 3 times daily nursing facility Refer to dietitian Follow-up in 2 weeks   Patient expressed understanding and was in agreement with this plan. He also understands that He can call clinic at any time with any questions, concerns, or complaints.     Time Total: 15 minutes  Visit consisted of counseling and education dealing with the complex and emotionally intense issues of symptom management and palliative care in the setting of serious and potentially life-threatening illness.Greater than 50%  of this time was spent counseling and coordinating care related to the above assessment and plan.  Signed by: Altha Harm, PhD, NP-C [phone] (Work Cell)

## 2018-04-08 NOTE — Telephone Encounter (Signed)
Spoke with nursing team about orders and plans to start [facility]. This note was also [facility] to [facility] per [facility]'s request. [facility] is up [facility] regarding patient's new apts.

## 2018-04-08 NOTE — Addendum Note (Signed)
Addended by: Sabino Gasser on: 04/08/2018 04:16 PM   Modules accepted: Orders

## 2018-04-08 NOTE — Patient Instructions (Signed)
Preventing Pressure Injuries WHAT IS A PRESSURE INJURY? A pressure injury, previously called a bedsore or a pressure ulcer, is an injury to the skin and underlying tissue caused by pressure. A pressure injury can happen when your skin presses against a surface, such as a mattress or wheelchair seat, for too long. The pressure on the blood vessels causes reduced blood flow to your skin. This can eventually cause the skin tissue to die and break down into a wound. Pressure injuries usually develop:  Over bony parts of the body, such as the tailbone, shoulders, elbows, hips, and heels.  Under medical devices, such as respiratory equipment, stockings, tubes, and splints.  They can cause pain, muscle damage, and infection. HOW DO PRESSURE INJURIES HAPPEN? Pressure injuries are caused by a lack of blood supply to an area of skin. These injuries begin as a reddened area on the skin and can become an open sore. They can result from intense pressure over a short period of time or from less pressure over a long period of time. Pressure injuries can vary in severity. This condition is more likely to develop in people who:  Are in the hospital or an extended care facility.  Are bedridden or in a wheelchair.  Have an injury or disease that keeps them from: ? Moving normally. ? Feeling pain or pressure. ? Communicating if they feel pain or pressure.  Have a condition that: ? Makes them sleepy or less alert. ? Causes poor blood flow.  Need to wear a medical device.  Have poor control of their bladder or bowel functions (incontinence).  Have poor nutrition (malnutrition).  Have had this condition before.  Are of certain ethnicities. People of African American and Latino or Hispanic descent are at higher risk compared to other ethnic groups.  HOW CAN I PREVENT PRESSURE INJURIES? Skin Care  Keep your skin clean and dry. Gently pat your skin dry.  Do not rub or massage boney areas of your  skin.  Moisturize dry skin.  Use gentle cleansers and skin protectants routinely if you are incontinent.  Check your skin every day for any changes in color and for any new blisters or sores. Make sure to check under and around any medical devices and between skin folds. Have a caregiver do this for you if you are not able. Reducing and Redistributing Pressure  Do not lie or sit in one position for a long time. Move or change position every two hours, or as told by your health care provider.  Use pillows or cushions to redistribute pressure. Ask your health care provider to recommend cushions or pads for you.  Use medical devices that to not rub your skin. Tell your health care provider if one of your medical devices is causing pain or irritation. Medicines  Take over-the-counter and prescription medicines only as told by your health care provider.  If you were prescribed an antibiotic medicine, take it or apply it as told by your health care provider. Do not stop taking or using the antibiotic even if your condition improves. General Instructions  Be as active as you can every day. Ask your health care provider to suggest safe exercises or activities.  Work with your health care provider to manage any chronic health conditions.  Eat a healthy diet that includes lots of protein. Ask your health care provider for diet advice.  Drink enough fluid to keep your urine clear or pale yellow.  Do not abuse drugs or alcohol.  Do not smoke.  Keep all follow-up visits as told by your health care provider. This is important. WHAT STEPS WILL BE TAKEN TO PREVENT PRESSURE INJURIES IF I AM IN THE HOSPITAL? Your health care providers:  Will inspect your skin at least daily. Skin under or around medical devices should be checked at least twice a day while you are in the hospital.  May recommend that you use certain types of bedding to help prevent them. These may include a pad, mattress, or  chair cushion that is filled with gel, air, water, or foam.  Will evaluate your nutrition and consult a diet specialist (dietician), if needed.  Will inspect and change any wound dressings regularly.  May help you move into different positions every few hours.  Will adjust any medical devices and braces as needed to limit pressure on your skin.  Will keep your skin clean and dry.  May use gentle cleansers and skin protectants, if you are incontinent.  Will moisturize any dry skin.  Make sure that you let your health care provider know if you feel or see any changes in your skin. This information is not intended to replace advice given to you by your health care provider. Make sure you discuss any questions you have with your health care provider. Document Released: 06/11/2004 Document Revised: 10/10/2015 Document Reviewed: 02/07/2015 Elsevier Interactive Patient Education  [employer].

## 2018-04-08 NOTE — Progress Notes (Signed)
Patient currently residing at [facility]. There is an small area of errythema on the sacrum. No signs of skin ulceration. Pt and pt's wife educated on bedsore prevention.

## 2018-04-08 NOTE — Progress Notes (Signed)
[place] OFFICE PROGRESS NOTE  Patient Care Team: Valerie Roys, DO as PCP - General (Family Medicine) Telford Nab, RN as Registered Nurse Rogue Bussing, Elisha Headland, MD as Medical Oncologist (Medical Oncology)  Cancer Staging No matching staging information was found for the patient.   Oncology History   # July 2019- [facility] CA- RUL [s/p Liver Bx]-CT chest- RUL/ liver/ brain solitary lesion.  to contralateral lung; pleura; liver and brain.July 2019- A/P- liver metastases at least 2; bone scan shows right humeral; left fifth rib; L2 vertebral body metastases.  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  --------------------------------------------------------------------   DIAGNOSIS: Adenocarcinoma lung  STAGE: 4       ;GOALS: Palliative  CURRENT/MOST RECENT THERAPY -CARBO+TAXOL+ATEZO+AVASTIN      Primary cancer of right upper lobe of lung ([place])   11/24/2017 -  Chemotherapy    The patient had bevacizumab (AVASTIN) 1,000 mg in sodium chloride 0.9 % 100 mL chemo infusion, 15 mg/kg = 1,000 mg, Intravenous,  Once, 4 of 4 cycles Administration: 1,000 mg (12/16/2017), 1,000 mg (01/19/2018), 1,000 mg (02/09/2018), 1,000 mg (03/03/2018)  for chemotherapy treatment.     12/09/2017 -  Chemotherapy    The patient had palonosetron (ALOXI) injection 0.25 mg, 0.25 mg, Intravenous,  Once, 4 of 4 cycles Administration: 0.25 mg (12/16/2017), 0.25 mg (01/19/2018), 0.25 mg (02/09/2018), 0.25 mg (03/03/2018) CARBOplatin (PARAPLATIN) 670 mg in sodium chloride 0.9 % 250 mL chemo infusion, 670 mg (100 % of original dose 667.8 mg), Intravenous,  Once, 4 of 4 cycles Dose modification:   (original dose 667.8 mg, Cycle 1, Reason: Provider Judgment) Administration: 670 mg (12/16/2017), 610 mg (01/19/2018), 610 mg (02/09/2018) PACLitaxel (TAXOL) 360 mg in sodium chloride 0.9 % 500 mL chemo infusion (>  44m/m2), 200 mg/m2 = 360 mg, Intravenous,  Once, 4 of 4 cycles Administration: 360 mg (12/16/2017), 360 mg (01/19/2018), 360 mg (02/09/2018), 360 mg (03/03/2018) atezolizumab (TECENTRIQ) 1,200 mg in sodium chloride 0.9 % 250 mL chemo infusion, 1,200 mg, Intravenous, Once, 4 of 4 cycles Administration: 1,200 mg (12/16/2017), 1,200 mg (01/19/2018), 1,200 mg (02/09/2018), 1,200 mg (03/03/2018)  for chemotherapy treatment.        INTERVAL HISTORY:  James Deziel89y.[id number].  male of a history of metastatic adenocarcinoma the lung currently status post cycle #4 carbotaxol Avastin plus Tecentriq is here for follow-up.  Patient had a stable disease after the 4 cycles of induction chemo immunotherapy.  However course was complicated by acute respiratory failure likely attributed to immunotherapy.  Patient received infliximab x2.  Also received her stress test.  Current on prednisone 60 mg a day.  Patient is currently in rehab [facility] needing 2 to 3 L of oxygen.  Patient tolerating physical therapy water therapy well.   No fever no chills.  Hallucinations improved.  Review of Systems  Constitutional: Positive for malaise/fatigue and weight loss. Negative for chills, diaphoresis and fever.  HENT: Negative for nosebleeds and sore throat.   Eyes: Negative for double vision.  Respiratory: Positive for cough and shortness of breath. Negative for hemoptysis, sputum production and wheezing.   Cardiovascular: Negative for chest pain, palpitations, orthopnea and leg swelling.  Gastrointestinal: Negative for abdominal pain, blood in stool, constipation, diarrhea, heartburn, melena and vomiting.  Genitourinary: Negative for dysuria, frequency and urgency.  Musculoskeletal: Negative for back pain and joint pain.  Skin: Negative.  Negative for itching and rash.  Neurological: Negative  for dizziness, tingling, focal weakness, weakness and headaches.  Endo/Heme/Allergies: Does not bruise/bleed easily.   Psychiatric/Behavioral: Negative for depression. The patient is nervous/anxious. The patient does not have insomnia.       PAST MEDICAL HISTORY :  Past Medical History:  Diagnosis Date  . Cancer ([place]) 12/01/2017   Primary cancer of right upper lobe of lung. Mets to liver, brain.  Marland Kitchen Prepatellar bursitis of left knee    patient denies    PAST SURGICAL HISTORY :   Past Surgical History:  Procedure Laterality Date  . collapsed lung  2012   "had to reinflate" " i carried large piece of sheet rock up stairs by myself "   . [place]   x2 , second was with mesh   . TOTAL HIP ARTHROPLASTY Right 04/09/2017   Procedure: RIGHT TOTAL HIP ARTHROPLASTY ANTERIOR APPROACH;  Surgeon: Mcarthur Rossetti, MD;  Location: [facility];  Service: Orthopedics;  Laterality: Right;  . URETHRAL STRICTURE DILATATION      FAMILY HISTORY :  No family history on file.  SOCIAL HISTORY:   Social History   Tobacco Use  . Smoking status: Former Smoker    Types: Cigarettes    Last attempt to quit: 03/18/2012    Years since quitting: 6.0  . Smokeless tobacco: Never Used  . Tobacco comment: 5 years   Substance Use Topics  . Alcohol use: Yes    Comment: Socially  . Drug use: Yes    Types: Marijuana    Comment: 1 month ago     ALLERGIES:  has No Known Allergies.  MEDICATIONS:  Current Outpatient Medications  Medication Sig Dispense Refill  . albuterol (PROAIR HFA) 108 (90 Base) MCG/ACT inhaler Inhale 1-2 puffs into the lungs every 6 (six) hours as needed for wheezing or shortness of breath. 1 Inhaler 5  . budesonide (PULMICORT) 0.25 MG/2ML nebulizer solution Take 2 mLs (0.25 mg total) by nebulization 2 (two) times daily. 60 mL 12  . diltiazem (CARDIZEM) 30 MG tablet Take 1 tablet (30 mg total) by mouth every 8 (eight) hours.    Marland Kitchen guaiFENesin (MUCINEX) 600 MG 12 hr tablet Take 1 tablet (600 mg total) by mouth 2 (two) times daily.    Marland Kitchen levalbuterol (XOPENEX) 1.25 MG/0.5ML nebulizer  solution Take 0.63 mg by nebulization every 6 (six) hours as needed for wheezing or shortness of breath. 1 each 12  . LORazepam (ATIVAN) 1 MG tablet Take 1 tablet (1 mg total) by mouth at bedtime as needed for anxiety. 30 tablet 0  . metoprolol tartrate (LOPRESSOR) 25 MG tablet Take 0.5 tablets (12.5 mg total) by mouth 2 (two) times daily. 60 tablet 0  . ondansetron (ZOFRAN) 8 MG tablet One pill every 8 hours as needed for nausea/vomitting. 40 tablet 1  . predniSONE (DELTASONE) 50 MG tablet Take 57m po daily x 14 days    . prochlorperazine (COMPAZINE) 10 MG tablet Take 1 tablet (10 mg total) by mouth every 6 (six) hours as needed for nausea or vomiting. 40 tablet 1  . risperiDONE (RISPERDAL M-TABS) 0.5 MG disintegrating tablet Take 1 tablet (0.5 mg total) by mouth at bedtime. 30 tablet 0  . SPIRIVA HANDIHALER 18 MCG inhalation capsule INHALE 1 CAPSULE VIA HANDIHALER ONCE DAILY AT THE SAME TIME EVERY DAY 30 capsule 0  . sulfamethoxazole-trimethoprim (BACTRIM DS,SEPTRA DS) 800-160 MG tablet Take 1 tablet by mouth daily for 14 days. 14 tablet 0  . traZODone (DESYREL) 50 MG tablet Take  1 tablet (50 mg total) by mouth at bedtime as needed for sleep (anxiety). 30 tablet 0   No current facility-administered medications for this visit.     PHYSICAL EXAMINATION: ECOG PERFORMANCE STATUS: 1 - Symptomatic but completely ambulatory  BP 119/80 (BP Location: Left Arm, Patient Position: Sitting)   Pulse (!) 112   Temp (!) 97.5 F (36.4 C) (Tympanic)   Resp 20   Ht _0  (1.778 m)   Wt 123 lb 12.8 oz (56.2 kg)   SpO2 94%   BMI 17.76 kg/m   Filed Weights   04/08/18 0831  Weight: 123 lb 12.8 oz (56.2 kg)    Physical Exam  Constitutional: He is oriented to person, place, and time.  Accompanied by his wife.  Cachectic appearing.  On nasal cannula oxygen.  He is in a wheelchair.  HENT:  Head: Normocephalic and atraumatic.  Mouth/Throat: Oropharynx is clear and moist. No oropharyngeal exudate.   Eyes: Pupils are equal, round, and reactive to light.  Neck: Normal range of motion. Neck supple.  Cardiovascular: Normal rate and regular rhythm.  Pulmonary/Chest: No respiratory distress. He has no wheezes.  Decreased air entry bilaterally.  Abdominal: Soft. Bowel sounds are normal. He exhibits no distension and no mass. There is no tenderness. There is no rebound and no guarding.  Musculoskeletal: Normal range of motion. He exhibits no edema or tenderness.  Neurological: He is alert and oriented to person, place, and time.  Skin: Skin is warm.  Psychiatric: Affect normal.    LABORATORY DATA:  I have reviewed the data as listed    Component Value Date/Time   NA 135 04/08/2018 0805   NA 138 08/24/2017 1651   NA 137 04/23/2013 1330   K 3.6 04/08/2018 0805   K 3.8 04/23/2013 1330   CL 105 04/08/2018 0805   CL 105 04/23/2013 1330   CO2 23 04/08/2018 0805   CO2 22 04/23/2013 1330   GLUCOSE 95 04/08/2018 0805   GLUCOSE 120 (H) 04/23/2013 1330   BUN 20 04/08/2018 0805   BUN 18 08/24/2017 1651   BUN 18 04/23/2013 1330   CREATININE 0.66 04/08/2018 0805   CREATININE 0.90 04/23/2013 1330   CALCIUM 8.8 (L) 04/08/2018 0805   CALCIUM 9.3 04/23/2013 1330   PROT 6.0 (L) 04/08/2018 0805   PROT 6.8 08/24/2017 1651   PROT 7.5 04/23/2013 1330   ALBUMIN 3.1 (L) 04/08/2018 0805   ALBUMIN 4.2 08/24/2017 1651   ALBUMIN 3.8 04/23/2013 1330   AST 44 (H) 04/08/2018 0805   AST 11 (L) 04/23/2013 1330   ALT 80 (H) 04/08/2018 0805   ALT 18 04/23/2013 1330   ALKPHOS 93 04/08/2018 0805   ALKPHOS 64 04/23/2013 1330   BILITOT 0.6 04/08/2018 0805   BILITOT 0.2 08/24/2017 1651   BILITOT 0.6 04/23/2013 1330   GFRNONAA >60 04/08/2018 0805   GFRNONAA >60 04/23/2013 1330   GFRAA >60 04/08/2018 0805   GFRAA >60 04/23/2013 1330    No results found for: SPEP, UPEP  Lab Results  Component Value Date   WBC 9.9 04/08/2018   NEUTROABS 7.5 04/08/2018   HGB 12.0 (L) 04/08/2018   HCT 35.1 (L)  04/08/2018   MCV 95.6 04/08/2018   PLT 181 04/08/2018      Chemistry      Component Value Date/Time   NA 135 04/08/2018 0805   NA 138 08/24/2017 1651   NA 137 04/23/2013 1330   K 3.6 04/08/2018 0805   K 3.8 04/23/2013 1330  CL 105 04/08/2018 0805   CL 105 04/23/2013 1330   CO2 23 04/08/2018 0805   CO2 22 04/23/2013 1330   BUN 20 04/08/2018 0805   BUN 18 08/24/2017 1651   BUN 18 04/23/2013 1330   CREATININE 0.66 04/08/2018 0805   CREATININE 0.90 04/23/2013 1330      Component Value Date/Time   CALCIUM 8.8 (L) 04/08/2018 0805   CALCIUM 9.3 04/23/2013 1330   ALKPHOS 93 04/08/2018 0805   ALKPHOS 64 04/23/2013 1330   AST 44 (H) 04/08/2018 0805   AST 11 (L) 04/23/2013 1330   ALT 80 (H) 04/08/2018 0805   ALT 18 04/23/2013 1330   BILITOT 0.6 04/08/2018 0805   BILITOT 0.2 08/24/2017 1651   BILITOT 0.6 04/23/2013 1330       RADIOGRAPHIC STUDIES: I have personally reviewed the radiological images as listed and agreed with the findings in the report. No results found.   ASSESSMENT & PLAN:  Primary cancer of right upper lobe of lung ([place]) #Lung adenocarcinoma-stage IV-metastases- STABLE. S/p carbotaxol plus Avastin plus Tecentriq cycle # 4;  NOV 2019- Partial response/stable disease; however unfortunately clinical course complicated by pneumonitis/immunotherapy mediated [see discussion below].   #With regards to lung cancer clinically stable/hold off further therapy at this time especially given pneumonitis from immunotherapy.  Will repeat a CT scan again on the line.  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  Still needing 3 L of oxygen.  Recommend continue prednisone 60 mg a day.  Get a chest x-ray today.  If still not improved then recommend mycophenolate.  Continue inhalers.  # Bone mets- X-geva continue-  calcium vitamin D.  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  Stable  #Delirium hallucination while in the hospital currently improved.  Continue Risperdal but as needed at nighttime.  #Sacral erythema-early stage pressure ulcer.  Recommend zinc oxide twice daily/as needed.  #Weight loss cachexia secondary to above comorbidities/recent decline in performance status-recommend protein intake.  #Patient likely discharged home Tuesday of next week.  As per wife.  #Follow-up with palliative care today.   # DISPOSITION:  # follow up on Dec 3rd in [place]/labs 9:15- CBC/cmp- Dr.B  #We will advise on prednisone/mycophenolate based on chest x-ray results.  Will call family.  # 40 minutes face-to-face with the patient discussing the above plan of care; more than 50% of time spent on prognosis/ natural history; counseling and coordination.     Orders Placed This Encounter  Procedures  . DG Chest 2 View    Standing Status:   Future    Standing Expiration Date:   06/08/2019    Order Specific Question:   Reason for Exam (SYMPTOM  OR DIAGNOSIS REQUIRED)    Answer:   cough    Order Specific Question:   Preferred imaging location?    Answer:   [facility]   All questions were answered. The patient knows to call the clinic with any problems, questions or concerns.      Cammie Sickle, MD 04/08/2018 9:16 AM

## 2018-04-08 NOTE — Assessment & Plan Note (Addendum)
#  Lung adenocarcinoma-stage IV-metastases- STABLE. S/p carbotaxol plus Avastin plus Tecentriq cycle # 4;  NOV 2019- Partial response/stable disease; however unfortunately clinical course complicated by pneumonitis/immunotherapy mediated [see discussion below].   #With regards to lung cancer clinically stable/hold off further therapy at this time especially given pneumonitis from immunotherapy.  Will repeat a CT scan again on the line.  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  Still needing 3 L of oxygen.  Recommend continue prednisone 60 mg a day.  Get a chest x-ray today.  If still not improved then recommend mycophenolate.  Continue inhalers.  # Bone mets- X-geva continue-  calcium vitamin D.  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  Stable  #Delirium hallucination while in the hospital currently improved.  Continue Risperdal but as needed at nighttime.  #Sacral erythema-early stage pressure ulcer.  Recommend zinc oxide twice daily/as needed.  #Weight loss cachexia secondary to above comorbidities/recent decline in performance status-recommend protein intake.  #Patient likely discharged home Tuesday of next week.  As per wife.  #Follow-up with palliative care today.   # DISPOSITION:  # follow up on Dec 3rd in [place]/labs 9:15- CBC/cmp- Dr.B  #We will advise on prednisone/mycophenolate based on chest x-ray results.  Will call family.  # 40 minutes face-to-face with the patient discussing the above plan of care; more than 50% of time spent on prognosis/ natural history; counseling and coordination.

## 2018-04-08 NOTE — Telephone Encounter (Signed)
Spoke to wife regarding chest x-ray not improving.  Recommend adding mycophenolate/CellCept 500 mg twice daily; continue prednisone 60 mg a day.  Discussed with pharmacy-$16 a month prescription.   # collete- Recommend follow-up 11/27-Wednesday 8:30; labs- cbc/cmp-please schedule.   # Heather/Brooke-call [facility] that I am adding CellCept; but the patient will get from his pharmacy.  # keep other appts as planned for now.   Thx GB

## 2018-04-11 ENCOUNTER — Telehealth: Payer: Self-pay | Admitting: *Deleted

## 2018-04-11 NOTE — Telephone Encounter (Signed)
Dr. B advised we call and discuss palliative care options rather than hospice with the patient/patient's wife - as when hospice comes in, the patient's treatment will stop. I left message for patient's wife to return phone call.

## 2018-04-11 NOTE — Telephone Encounter (Signed)
Deborah from hospice called and states patient wife called and he is bing discharged from [facility] and she wants an in home hospice referral for him. They also state he needs O2 at home. Please send referral, office note, and demographics if  In agreement

## 2018-04-12 NOTE — Telephone Encounter (Signed)
Spoke with pt's wife and she states that pt has improved over the past 24 hours. At this time, they wish to put the hospice referral on hold until further discussion with Dr. Rogue Bussing. Pt was discharged from [facility] with home health services to help patient while at home.

## 2018-04-12 NOTE — Telephone Encounter (Signed)
Hayley, Dr. B would like you to contact the patient's wife to further discuss. He has not talked with the patient about hospice.

## 2018-04-13 ENCOUNTER — Inpatient Hospital Stay: Payer: BC Managed Care – PPO

## 2018-04-13 ENCOUNTER — Inpatient Hospital Stay: Payer: BC Managed Care – PPO | Admitting: Internal Medicine

## 2018-04-18 ENCOUNTER — Telehealth: Payer: Self-pay | Admitting: *Deleted

## 2018-04-18 NOTE — Telephone Encounter (Signed)
Requested verbal order for plan of care. Gave verbal order per Dr. Rogue Bussing.   dhs

## 2018-04-19 ENCOUNTER — Encounter: Payer: Self-pay | Admitting: Internal Medicine

## 2018-04-19 ENCOUNTER — Telehealth: Payer: Self-pay | Admitting: Internal Medicine

## 2018-04-19 ENCOUNTER — Inpatient Hospital Stay: Payer: BC Managed Care – PPO | Attending: Internal Medicine

## 2018-04-19 ENCOUNTER — Inpatient Hospital Stay (HOSPITAL_BASED_OUTPATIENT_CLINIC_OR_DEPARTMENT_OTHER): Payer: BC Managed Care – PPO | Admitting: Internal Medicine

## 2018-04-19 ENCOUNTER — Ambulatory Visit
Admission: RE | Admit: 2018-04-19 | Discharge: 2018-04-19 | Disposition: A | Discharge status: Home / Self Care | Payer: BC Managed Care – PPO | Source: Ambulatory Visit | Attending: Internal Medicine | Admitting: Internal Medicine

## 2018-04-19 VITALS — BP 126/84 | HR 99 | Temp 97.8°F | Resp 16

## 2018-04-19 DIAGNOSIS — C7951 Secondary malignant neoplasm of bone: Secondary | ICD-10-CM | POA: Diagnosis not present

## 2018-04-19 DIAGNOSIS — R7989 Other specified abnormal findings of blood chemistry: Secondary | ICD-10-CM | POA: Diagnosis not present

## 2018-04-19 DIAGNOSIS — R062 Wheezing: Secondary | ICD-10-CM | POA: Insufficient documentation

## 2018-04-19 DIAGNOSIS — C3411 Malignant neoplasm of upper lobe, right bronchus or lung: Secondary | ICD-10-CM | POA: Insufficient documentation

## 2018-04-19 DIAGNOSIS — R634 Abnormal weight loss: Secondary | ICD-10-CM

## 2018-04-19 DIAGNOSIS — J189 Pneumonia, unspecified organism: Secondary | ICD-10-CM | POA: Insufficient documentation

## 2018-04-19 DIAGNOSIS — J702 Acute drug-induced interstitial lung disorders: Secondary | ICD-10-CM

## 2018-04-19 DIAGNOSIS — F419 Anxiety disorder, unspecified: Secondary | ICD-10-CM | POA: Insufficient documentation

## 2018-04-19 DIAGNOSIS — Z87891 Personal history of nicotine dependence: Secondary | ICD-10-CM | POA: Diagnosis not present

## 2018-04-19 DIAGNOSIS — R64 Cachexia: Secondary | ICD-10-CM

## 2018-04-19 DIAGNOSIS — C7931 Secondary malignant neoplasm of brain: Secondary | ICD-10-CM

## 2018-04-19 DIAGNOSIS — Z9221 Personal history of antineoplastic chemotherapy: Secondary | ICD-10-CM | POA: Insufficient documentation

## 2018-04-19 DIAGNOSIS — Z79899 Other long term (current) drug therapy: Secondary | ICD-10-CM | POA: Insufficient documentation

## 2018-04-19 DIAGNOSIS — F410 Panic disorder [episodic paroxysmal anxiety] without agoraphobia: Secondary | ICD-10-CM

## 2018-04-19 DIAGNOSIS — T380X5A Adverse effect of glucocorticoids and synthetic analogues, initial encounter: Secondary | ICD-10-CM | POA: Insufficient documentation

## 2018-04-19 DIAGNOSIS — G720 Drug-induced myopathy: Secondary | ICD-10-CM | POA: Insufficient documentation

## 2018-04-19 DIAGNOSIS — Z7952 Long term (current) use of systemic steroids: Secondary | ICD-10-CM | POA: Diagnosis not present

## 2018-04-19 LAB — COMPREHENSIVE METABOLIC PANEL
ALBUMIN: 3.2 g/dL — AB (ref 3.5–5.0)
ALT: 119 U/L — AB (ref 0–44)
ANION GAP: 9 (ref 5–15)
AST: 52 U/L — ABNORMAL HIGH (ref 15–41)
Alkaline Phosphatase: 104 U/L (ref 38–126)
BUN: 24 mg/dL — ABNORMAL HIGH (ref 8–23)
CO2: 25 mmol/L (ref 22–32)
Calcium: 8.7 mg/dL — ABNORMAL LOW (ref 8.9–10.3)
Chloride: 102 mmol/L (ref 98–111)
Creatinine, Ser: 0.69 mg/dL (ref 0.61–1.24)
GFR calc Af Amer: >60 mL/min (ref >60)
GFR calc non Af Amer: >60 mL/min (ref >60)
GLUCOSE: 137 mg/dL — AB (ref 70–99)
Potassium: 3.8 mmol/L (ref 3.5–5.1)
SODIUM: 136 mmol/L (ref 135–145)
Total Bilirubin: 0.6 mg/dL (ref 0.3–1.2)
Total Protein: 6 g/dL — ABNORMAL LOW (ref 6.5–8.1)

## 2018-04-19 LAB — CBC WITH DIFFERENTIAL/PLATELET
ABS IMMATURE GRANULOCYTES: 0.04 10*3/uL (ref 0.00–0.07)
BASOS ABS: 0 10*3/uL (ref 0.0–0.1)
Basophils Relative: 0 %
Eosinophils Absolute: 0.1 10*3/uL (ref 0.0–0.5)
Eosinophils Relative: 2 %
HCT: 38.2 % — ABNORMAL LOW (ref 39.0–52.0)
HEMOGLOBIN: 12.8 g/dL — AB (ref 13.0–17.0)
Immature Granulocytes: 1 %
Lymphocytes Relative: 22 %
Lymphs Abs: 1.9 10*3/uL (ref 0.7–4.0)
MCH: 32.8 pg (ref 26.0–34.0)
MCHC: 33.5 g/dL (ref 30.0–36.0)
MCV: 97.9 fL (ref 80.0–100.0)
Monocytes Absolute: 0.5 10*3/uL (ref 0.1–1.0)
Monocytes Relative: 6 %
NEUTROS ABS: 6 10*3/uL (ref 1.7–7.7)
NEUTROS PCT: 69 %
NRBC: 0 % (ref 0.0–0.2)
Platelets: 175 10*3/uL (ref 150–400)
RBC: 3.9 MIL/uL — AB (ref 4.22–5.81)
RDW: 14.9 % (ref 11.5–15.5)
WBC: 8.6 10*3/uL (ref 4.0–10.5)

## 2018-04-19 MED ORDER — TIOTROPIUM BROMIDE MONOHYDRATE 18 MCG IN CAPS: ORAL_CAPSULE | RESPIRATORY_TRACT | 3 refills | Status: AC

## 2018-04-19 MED ORDER — TRAZODONE HCL 100 MG PO TABS: 100.0000 mg | ORAL_TABLET | Freq: Every day | ORAL | 3 refills | Status: DC

## 2018-04-19 MED ORDER — PREDNISONE 20 MG PO TABS: ORAL_TABLET | ORAL | 0 refills | Status: DC

## 2018-04-19 MED ORDER — RISPERIDONE 1 MG PO TBDP: 1.0000 mg | ORAL_TABLET | Freq: Three times a day (TID) | ORAL | 0 refills | Status: DC | PRN

## 2018-04-19 MED ORDER — TRAZODONE HCL 100 MG PO TABS: 50.0000 mg | ORAL_TABLET | Freq: Every day | ORAL | 3 refills | Status: DC

## 2018-04-19 NOTE — Telephone Encounter (Signed)
Patient notified via voicemail of these results. I advised patient to call back with any questions.

## 2018-04-19 NOTE — Assessment & Plan Note (Addendum)
#  Lung adenocarcinoma-stage IV; S/p carbotaxol plus Avastin plus Tecentriq cycle # 4; CT scan NOV 2019- partial response/ STABLE disease;  however unfortunately clinical course complicated by pneumonitis/immunotherapy mediated [see discussion below].   # continue to HOLD further systemic therapy given acute issues [see below].   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  Not back to baseline.  S/p infliximab x2;Taper prednisone to 40 mg/day; continue cellcept 500 mg BID. Get CXR today.  Spiriva prescription sent.  # Bone mets- X-geva continue-  calcium vitamin D.  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  Stable.   # Panic attacks/ anxiety/ insominia- increase trazadone to 100 mg/day. Continue 1 mg risperidal as needed  #Weight loss cachexia secondary to above comorbidities/recent decline in performance status-improving.  On prednisone.  Continue protein intake/awaiting to speak to dietitian.  #Mild elevation of LFTs normal bilirubin-question drug-induced.  Monitor closely repeat in 1 week.  # DISPOSITION:  # get CXR today. # follow up in 1 week/labs-CBC/CMP- Dr.B   Addendum: Chest x-ray reviewed personally; slight improvement of the interstitial changes bilaterally right more than left.  Continue current therapy.  Patient will be informed.

## 2018-04-19 NOTE — Telephone Encounter (Signed)
Heather/Brooke - please inform patient/wife that chest x-ray shows slight continued improvement.  Continue current therapy/ follow-up as planned. Dr.B

## 2018-04-19 NOTE — Progress Notes (Signed)
[facility] OFFICE PROGRESS NOTE  Patient Care Team: Valerie Roys, DO as PCP - General (Family Medicine) Telford Nab, RN as Registered Nurse Rogue Bussing, Elisha Headland, MD as Medical Oncologist (Medical Oncology)  Cancer Staging No matching staging information was found for the patient.   Oncology History   # July 2019- [facility] CA- RUL [s/p Liver Bx]-CT chest- RUL/ liver/ brain solitary lesion.  to contralateral lung; pleura; liver and brain.July 2019- A/P- liver metastases at least 2; bone scan shows right humeral; left fifth rib; L2 vertebral body metastases.  # July 31st- carbo-tax-Atezo+Avastin s/p 4 cycles; OCT 2019- CT Stable/PR   # NOV 2019- Acute respiratory faliure sec to immunotherapy induced- NOV 2019- S/p infliximab x2; prednisone to 40 mg/day; 11/18 cellcept 500 mg BID.   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  --------------------------------------------------------------------   DIAGNOSIS: Adenocarcinoma lung  STAGE: 4    ;GOALS: Palliative  CURRENT/MOST RECENT THERAPY: Off therapy/surveillance      Primary cancer of right upper lobe of lung ([place])   11/24/2017 -  Chemotherapy    The patient had bevacizumab (AVASTIN) 1,000 mg in sodium chloride 0.9 % 100 mL chemo infusion, 15 mg/kg = 1,000 mg, Intravenous,  Once, 4 of 4 cycles Administration: 1,000 mg (12/16/2017), 1,000 mg (01/19/2018), 1,000 mg (02/09/2018), 1,000 mg (03/03/2018)  for chemotherapy treatment.     12/09/2017 -  Chemotherapy    The patient had palonosetron (ALOXI) injection 0.25 mg, 0.25 mg, Intravenous,  Once, 4 of 4 cycles Administration: 0.25 mg (12/16/2017), 0.25 mg (01/19/2018), 0.25 mg (02/09/2018), 0.25 mg (03/03/2018) CARBOplatin (PARAPLATIN) 670 mg in sodium chloride 0.9 % 250 mL chemo infusion, 670 mg (100 % of original dose 667.8 mg), Intravenous,  Once, 4 of 4 cycles Dose modification:   (original dose 667.8 mg, Cycle  1, Reason: Provider Judgment) Administration: 670 mg (12/16/2017), 610 mg (01/19/2018), 610 mg (02/09/2018) PACLitaxel (TAXOL) 360 mg in sodium chloride 0.9 % 500 mL chemo infusion (> 28m/m2), 200 mg/m2 = 360 mg, Intravenous,  Once, 4 of 4 cycles Administration: 360 mg (12/16/2017), 360 mg (01/19/2018), 360 mg (02/09/2018), 360 mg (03/03/2018) atezolizumab (TECENTRIQ) 1,200 mg in sodium chloride 0.9 % 250 mL chemo infusion, 1,200 mg, Intravenous, Once, 4 of 4 cycles Administration: 1,200 mg (12/16/2017), 1,200 mg (01/19/2018), 1,200 mg (02/09/2018), 1,200 mg (03/03/2018)  for chemotherapy treatment.        INTERVAL HISTORY:  James [id number].  male of a history of metastatic adenocarcinoma the lung currently status post cycle #4 carbotaxol Avastin plus Tecentriq is here for follow-up.  Patient is currently off therapy because of acute respiratory failure secondary immunotherapy pneumonitis.  Patient is currently out of rehab at home.  Is needing 2 to 3 L of oxygen especially with exertion.  He has been on prednisone 60 mg a day.  He is also on CellCept 500 mg twice daily for the last 2 weeks.  Patient continued to struggle with insomnia.  Continues to have problems with anxiety.  States to have good appetite.   Patient is awaiting to get home health/also home physical therapy.  Review of Systems  Constitutional: Positive for malaise/fatigue and weight loss. Negative for chills, diaphoresis and fever.  HENT: Negative for nosebleeds and sore throat.   Eyes: Negative for double vision.  Respiratory: Positive for shortness of breath. Negative for hemoptysis, sputum production and wheezing.   Cardiovascular: Negative for chest pain, palpitations, orthopnea and leg swelling.  Gastrointestinal: Negative for abdominal pain, blood in stool, constipation, diarrhea, heartburn, melena and vomiting.  Genitourinary: Negative for dysuria, frequency and urgency.  Musculoskeletal: Negative for back pain and joint  pain.  Skin: Negative.  Negative for itching and rash.  Neurological: Negative for dizziness, tingling, focal weakness, weakness and headaches.  Endo/Heme/Allergies: Does not bruise/bleed easily.  Psychiatric/Behavioral: Negative for depression. The patient is nervous/anxious. The patient does not have insomnia.       PAST MEDICAL HISTORY :  Past Medical History:  Diagnosis Date  . Cancer ([place]) 12/01/2017   Primary cancer of right upper lobe of lung. Mets to liver, brain.  Marland Kitchen Prepatellar bursitis of left knee    patient denies    PAST SURGICAL HISTORY :   Past Surgical History:  Procedure Laterality Date  . collapsed lung  2012   "had to reinflate" " i carried large piece of sheet rock up stairs by myself "   . [place]   x2 , second was with mesh   . TOTAL HIP ARTHROPLASTY Right 04/09/2017   Procedure: RIGHT TOTAL HIP ARTHROPLASTY ANTERIOR APPROACH;  Surgeon: Mcarthur Rossetti, MD;  Location: [facility];  Service: Orthopedics;  Laterality: Right;  . URETHRAL STRICTURE DILATATION      FAMILY HISTORY :  History reviewed. No pertinent family history.  SOCIAL HISTORY:   Social History   Tobacco Use  . Smoking status: Former Smoker    Types: Cigarettes    Last attempt to quit: 03/18/2012    Years since quitting: 6.0  . Smokeless tobacco: Never Used  . Tobacco comment: 5 years   Substance Use Topics  . Alcohol use: Yes    Comment: Socially  . Drug use: Yes    Types: Marijuana    Comment: 1 month ago     ALLERGIES:  has No Known Allergies.  MEDICATIONS:  Current Outpatient Medications  Medication Sig Dispense Refill  . albuterol (PROAIR HFA) 108 (90 Base) MCG/ACT inhaler Inhale 1-2 puffs into the lungs every 6 (six) hours as needed for wheezing or shortness of breath. 1 Inhaler 5  . budesonide (PULMICORT) 0.25 MG/2ML nebulizer solution Take 2 mLs (0.25 mg total) by nebulization 2 (two) times daily. 60 mL 12  . diltiazem (CARDIZEM) 30 MG tablet  Take 1 tablet (30 mg total) by mouth every 8 (eight) hours.    Marland Kitchen guaiFENesin (MUCINEX) 600 MG 12 hr tablet Take 1 tablet (600 mg total) by mouth 2 (two) times daily.    Marland Kitchen levalbuterol (XOPENEX) 1.25 MG/0.5ML nebulizer solution Take 0.63 mg by nebulization every 6 (six) hours as needed for wheezing or shortness of breath. 1 each 12  . LORazepam (ATIVAN) 1 MG tablet Take 1 tablet (1 mg total) by mouth at bedtime as needed for anxiety. 30 tablet 0  . metoprolol tartrate (LOPRESSOR) 25 MG tablet Take 0.5 tablets (12.5 mg total) by mouth 2 (two) times daily. 60 tablet 0  . mycophenolate (CELLCEPT) 500 MG tablet Take 1 tablet (500 mg total) by mouth 2 (two) times daily. 60 tablet 0  . ondansetron (ZOFRAN) 8 MG tablet One pill every 8 hours as needed for nausea/vomitting. 40 tablet 1  . predniSONE (DELTASONE) 20 MG tablet Take 2 pills in the morning with food.;  Do not stop until directed. 90 tablet 0  . prochlorperazine (COMPAZINE) 10 MG tablet Take 1 tablet (10 mg total) by mouth every 6 (six) hours as needed for nausea or vomiting. 40 tablet 1  .  risperiDONE (RISPERDAL M-TABS) 1 MG disintegrating tablet Take 1 tablet (1 mg total) by mouth every 8 (eight) hours as needed. For anxiety/restlessness 60 tablet 0  . tiotropium (SPIRIVA HANDIHALER) 18 MCG inhalation capsule INHALE 1 CAPSULE VIA HANDIHALER ONCE DAILY AT THE SAME TIME EVERY DAY 30 capsule 3  . traZODone (DESYREL) 100 MG tablet Take 1 tablet (100 mg total) by mouth at bedtime. 30 tablet 3   No current facility-administered medications for this visit.     PHYSICAL EXAMINATION: ECOG PERFORMANCE STATUS: 1 - Symptomatic but completely ambulatory  BP 126/84 (BP Location: Left Arm, Patient Position: Sitting)   Pulse 99   Temp 97.8 F (36.6 C) (Tympanic)   Resp 16   SpO2 95%   There were no vitals filed for this visit.  Physical Exam  Constitutional: He is oriented to person, place, and time.  Accompanied by his wife.  Cachectic appearing.   On nasal cannula oxygen.    HENT:  Head: Normocephalic and atraumatic.  Mouth/Throat: Oropharynx is clear and moist. No oropharyngeal exudate.  Eyes: Pupils are equal, round, and reactive to light.  Neck: Normal range of motion. Neck supple.  Cardiovascular: Normal rate and regular rhythm.  Pulmonary/Chest: No respiratory distress. He has no wheezes.  Decreased air entry bilaterally.  Abdominal: Soft. Bowel sounds are normal. He exhibits no distension and no mass. There is no tenderness. There is no rebound and no guarding.  Musculoskeletal: Normal range of motion. He exhibits no edema or tenderness.  Neurological: He is alert and oriented to person, place, and time.  Skin: Skin is warm.  Psychiatric: Affect normal.    LABORATORY DATA:  I have reviewed the data as listed    Component Value Date/Time   NA 136 04/19/2018 0853   NA 138 08/24/2017 1651   NA 137 04/23/2013 1330   K 3.8 04/19/2018 0853   K 3.8 04/23/2013 1330   CL 102 04/19/2018 0853   CL 105 04/23/2013 1330   CO2 25 04/19/2018 0853   CO2 22 04/23/2013 1330   GLUCOSE 137 (H) 04/19/2018 0853   GLUCOSE 120 (H) 04/23/2013 1330   BUN 24 (H) 04/19/2018 0853   BUN 18 08/24/2017 1651   BUN 18 04/23/2013 1330   CREATININE 0.69 04/19/2018 0853   CREATININE 0.90 04/23/2013 1330   CALCIUM 8.7 (L) 04/19/2018 0853   CALCIUM 9.3 04/23/2013 1330   PROT 6.0 (L) 04/19/2018 0853   PROT 6.8 08/24/2017 1651   PROT 7.5 04/23/2013 1330   ALBUMIN 3.2 (L) 04/19/2018 0853   ALBUMIN 4.2 08/24/2017 1651   ALBUMIN 3.8 04/23/2013 1330   AST 52 (H) 04/19/2018 0853   AST 11 (L) 04/23/2013 1330   ALT 119 (H) 04/19/2018 0853   ALT 18 04/23/2013 1330   ALKPHOS 104 04/19/2018 0853   ALKPHOS 64 04/23/2013 1330   BILITOT 0.6 04/19/2018 0853   BILITOT 0.2 08/24/2017 1651   BILITOT 0.6 04/23/2013 1330   GFRNONAA >60 04/19/2018 0853   GFRNONAA >60 04/23/2013 1330   GFRAA >60 04/19/2018 0853   GFRAA >60 04/23/2013 1330    No results  found for: SPEP, UPEP  Lab Results  Component Value Date   WBC 8.6 04/19/2018   NEUTROABS 6.0 04/19/2018   HGB 12.8 (L) 04/19/2018   HCT 38.2 (L) 04/19/2018   MCV 97.9 04/19/2018   PLT 175 04/19/2018      Chemistry      Component Value Date/Time   NA 136 04/19/2018 0853   NA  138 08/24/2017 1651   NA 137 04/23/2013 1330   K 3.8 04/19/2018 0853   K 3.8 04/23/2013 1330   CL 102 04/19/2018 0853   CL 105 04/23/2013 1330   CO2 25 04/19/2018 0853   CO2 22 04/23/2013 1330   BUN 24 (H) 04/19/2018 0853   BUN 18 08/24/2017 1651   BUN 18 04/23/2013 1330   CREATININE 0.69 04/19/2018 0853   CREATININE 0.90 04/23/2013 1330      Component Value Date/Time   CALCIUM 8.7 (L) 04/19/2018 0853   CALCIUM 9.3 04/23/2013 1330   ALKPHOS 104 04/19/2018 0853   ALKPHOS 64 04/23/2013 1330   AST 52 (H) 04/19/2018 0853   AST 11 (L) 04/23/2013 1330   ALT 119 (H) 04/19/2018 0853   ALT 18 04/23/2013 1330   BILITOT 0.6 04/19/2018 0853   BILITOT 0.2 08/24/2017 1651   BILITOT 0.6 04/23/2013 1330       RADIOGRAPHIC STUDIES: I have personally reviewed the radiological images as listed and agreed with the findings in the report. Dg Chest 2 View  Result Date: 04/19/2018 CLINICAL DATA:  Right upper lobe lung malignancy, nonproductive cough, follow-up from recent episode of pneumonitis. Persistent shortness of breath. EXAM: CHEST - 2 VIEW COMPARISON:  PA and lateral chest x-ray of April 08, 2018 FINDINGS: The lungs are well-expanded. Patchy airspace opacities demonstrated on the left have improved but have not cleared. There is stable patchy density peripherally in the right mid upper lung. The heart and pulmonary vascularity are normal. The mediastinum is normal in width. The bony thorax is unremarkable. IMPRESSION: Interval improvement in presumed pneumonitis related interstitial densities in the left lung. Fairly stable appearance of the right lung. An additional follow-up chest x-ray in 2-3 weeks is  recommended assuming the patient is clinically improving. The patient's symptoms are stable or worsening, repeat chest CT scanning would be useful. Electronically Signed   By: David  Martinique M.D.   On: 04/19/2018 10:43     ASSESSMENT & PLAN:  Primary cancer of right upper lobe of lung ([place]) #Lung adenocarcinoma-stage IV; S/p carbotaxol plus Avastin plus Tecentriq cycle # 4; CT scan NOV 2019- partial response/ STABLE disease;  however unfortunately clinical course complicated by pneumonitis/immunotherapy mediated [see discussion below].   # continue to HOLD further systemic therapy given acute issues [see below].   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  Not back to baseline.  S/p infliximab x2;Taper prednisone to 40 mg/day; continue cellcept 500 mg BID. Get CXR today.  Spiriva prescription sent.  # Bone mets- X-geva continue-  calcium vitamin D.  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  Stable.   # Panic attacks/ anxiety/ insominia- increase trazadone to 100 mg/day. Continue 1 mg risperidal as needed  #Weight loss cachexia secondary to above comorbidities/recent decline in performance status-improving.  On prednisone.  Continue protein intake/awaiting to speak to dietitian.  #Mild elevation of LFTs normal bilirubin-question drug-induced.  Monitor closely repeat in 1 week.  # DISPOSITION:  # get CXR today. # follow up in 1 week/labs-CBC/CMP- Dr.B   Addendum: Chest x-ray reviewed personally; slight improvement of the interstitial changes bilaterally right more than left.  Continue current therapy.  Patient will be informed.      Orders Placed This Encounter  Procedures  . DG Chest 2 View    Standing Status:   Future    Number of Occurrences:  1    Standing Expiration Date:   06/19/2019    Order Specific Question:   Reason for Exam (SYMPTOM  OR  DIAGNOSIS REQUIRED)    Answer:   pneumonitis    Order Specific Question:   Preferred imaging location?    Answer:   [facility]  . CBC with Differential/Platelet    Standing Status:   Future    Standing Expiration Date:   04/20/2019  . Comprehensive metabolic panel    Standing Status:   Future    Standing Expiration Date:   04/20/2019   All questions were answered. The patient knows to call the clinic with any problems, questions or concerns.      Cammie Sickle, MD 04/19/2018 11:58 AM

## 2018-04-22 ENCOUNTER — Inpatient Hospital Stay: Payer: BC Managed Care – PPO | Admitting: Family Medicine

## 2018-04-25 ENCOUNTER — Inpatient Hospital Stay: Payer: BC Managed Care – PPO | Admitting: Hospice and Palliative Medicine

## 2018-04-25 ENCOUNTER — Inpatient Hospital Stay: Payer: BC Managed Care – PPO

## 2018-04-25 ENCOUNTER — Telehealth: Payer: Self-pay | Admitting: Family Medicine

## 2018-04-25 ENCOUNTER — Telehealth: Payer: Self-pay | Admitting: Internal Medicine

## 2018-04-25 NOTE — Progress Notes (Signed)
Nutrition  Noted wife called and cancelled nutrition appointment for today.  Did not wish to reschedule at this time.  RD available as needed if patient interested.   Kalyn Hofstra B. Zenia Resides, [place], [place] Registered Dietitian [phone] (pager)

## 2018-04-25 NOTE — Telephone Encounter (Signed)
Patient's wife called to cancel appts with [facility] & [facility]. Did not wish to rschd.

## 2018-04-25 NOTE — Telephone Encounter (Signed)
Copied from [place] [id number]. Topic: Quick Communication - Home Health Verbal Orders >> Apr 25, 2018 12:19 PM Scherrie Gerlach wrote: Caller/Agency: [facility]//[facility] Callback Number: [phone] James [name] reports pt is not resting at night.  Pt is taking  traZODone (DESYREL) 100 MG tablet ,risperiDONE (RISPERDAL M-TABS) 1 MG disintegrating tablet and LORazepam (ATIVAN) 1 MG tablet. Wife reports pt is very agitated.  Pt is not going to do any more chemo right now, until he gets over the pna. James [name] would like advice

## 2018-04-26 ENCOUNTER — Telehealth: Payer: Self-pay | Admitting: Internal Medicine

## 2018-04-26 ENCOUNTER — Ambulatory Visit
Admission: RE | Admit: 2018-04-26 | Discharge: 2018-04-26 | Disposition: A | Discharge status: Home / Self Care | Payer: BC Managed Care – PPO | Source: Ambulatory Visit | Attending: Internal Medicine | Admitting: Internal Medicine

## 2018-04-26 ENCOUNTER — Encounter (INDEPENDENT_AMBULATORY_CARE_PROVIDER_SITE_OTHER): Payer: Self-pay

## 2018-04-26 ENCOUNTER — Inpatient Hospital Stay (HOSPITAL_BASED_OUTPATIENT_CLINIC_OR_DEPARTMENT_OTHER): Payer: BC Managed Care – PPO | Admitting: Internal Medicine

## 2018-04-26 ENCOUNTER — Ambulatory Visit
Admission: RE | Admit: 2018-04-26 | Discharge: 2018-04-26 | Disposition: A | Discharge status: Home / Self Care | Payer: BC Managed Care – PPO | Attending: Internal Medicine | Admitting: Internal Medicine

## 2018-04-26 ENCOUNTER — Other Ambulatory Visit: Payer: Self-pay

## 2018-04-26 ENCOUNTER — Inpatient Hospital Stay: Payer: BC Managed Care – PPO

## 2018-04-26 ENCOUNTER — Encounter: Payer: Self-pay | Admitting: Internal Medicine

## 2018-04-26 VITALS — BP 134/95 | HR 101 | Temp 97.6°F | Resp 22 | Ht 70.0 in | Wt 119.0 lb

## 2018-04-26 DIAGNOSIS — R64 Cachexia: Secondary | ICD-10-CM

## 2018-04-26 DIAGNOSIS — J9600 Acute respiratory failure, unspecified whether with hypoxia or hypercapnia: Secondary | ICD-10-CM

## 2018-04-26 DIAGNOSIS — J702 Acute drug-induced interstitial lung disorders: Secondary | ICD-10-CM | POA: Diagnosis not present

## 2018-04-26 DIAGNOSIS — C3411 Malignant neoplasm of upper lobe, right bronchus or lung: Secondary | ICD-10-CM

## 2018-04-26 DIAGNOSIS — R7989 Other specified abnormal findings of blood chemistry: Secondary | ICD-10-CM

## 2018-04-26 DIAGNOSIS — Z7952 Long term (current) use of systemic steroids: Secondary | ICD-10-CM

## 2018-04-26 DIAGNOSIS — C7951 Secondary malignant neoplasm of bone: Secondary | ICD-10-CM | POA: Diagnosis not present

## 2018-04-26 DIAGNOSIS — C7931 Secondary malignant neoplasm of brain: Secondary | ICD-10-CM

## 2018-04-26 DIAGNOSIS — Z9221 Personal history of antineoplastic chemotherapy: Secondary | ICD-10-CM

## 2018-04-26 DIAGNOSIS — Z79899 Other long term (current) drug therapy: Secondary | ICD-10-CM

## 2018-04-26 DIAGNOSIS — R634 Abnormal weight loss: Secondary | ICD-10-CM

## 2018-04-26 DIAGNOSIS — F410 Panic disorder [episodic paroxysmal anxiety] without agoraphobia: Secondary | ICD-10-CM

## 2018-04-26 LAB — COMPREHENSIVE METABOLIC PANEL
ALBUMIN: 3.3 g/dL — AB (ref 3.5–5.0)
ALT: 103 U/L — AB (ref 0–44)
AST: 48 U/L — AB (ref 15–41)
Alkaline Phosphatase: 101 U/L (ref 38–126)
Anion gap: 9 (ref 5–15)
BILIRUBIN TOTAL: 1 mg/dL (ref 0.3–1.2)
BUN: 24 mg/dL — AB (ref 8–23)
CHLORIDE: 103 mmol/L (ref 98–111)
CO2: 25 mmol/L (ref 22–32)
CREATININE: 0.63 mg/dL (ref 0.61–1.24)
Calcium: 8.7 mg/dL — ABNORMAL LOW (ref 8.9–10.3)
GFR calc Af Amer: >60 mL/min (ref >60)
GFR calc non Af Amer: >60 mL/min (ref >60)
GLUCOSE: 113 mg/dL — AB (ref 70–99)
POTASSIUM: 4.1 mmol/L (ref 3.5–5.1)
Sodium: 137 mmol/L (ref 135–145)
TOTAL PROTEIN: 6 g/dL — AB (ref 6.5–8.1)

## 2018-04-26 LAB — CBC WITH DIFFERENTIAL/PLATELET
Abs Immature Granulocytes: 0.1 10*3/uL — ABNORMAL HIGH (ref 0.00–0.07)
Basophils Absolute: 0 10*3/uL (ref 0.0–0.1)
Basophils Relative: 0 %
Eosinophils Absolute: 0 10*3/uL (ref 0.0–0.5)
Eosinophils Relative: 0 %
HCT: 37.2 % — ABNORMAL LOW (ref 39.0–52.0)
HEMOGLOBIN: 12.4 g/dL — AB (ref 13.0–17.0)
Immature Granulocytes: 1 %
LYMPHS PCT: 5 %
Lymphs Abs: 0.6 10*3/uL — ABNORMAL LOW (ref 0.7–4.0)
MCH: 32.3 pg (ref 26.0–34.0)
MCHC: 33.3 g/dL (ref 30.0–36.0)
MCV: 96.9 fL (ref 80.0–100.0)
MONOS PCT: 5 %
Monocytes Absolute: 0.5 10*3/uL (ref 0.1–1.0)
Neutro Abs: 9.7 10*3/uL — ABNORMAL HIGH (ref 1.7–7.7)
Neutrophils Relative %: 89 %
Platelets: 154 10*3/uL (ref 150–400)
RBC: 3.84 MIL/uL — ABNORMAL LOW (ref 4.22–5.81)
RDW: 14.5 % (ref 11.5–15.5)
WBC: 10.9 10*3/uL — ABNORMAL HIGH (ref 4.0–10.5)
nRBC: 0 % (ref 0.0–0.2)

## 2018-04-26 MED ORDER — DIAZEPAM 5 MG PO TABS: 5.0000 mg | ORAL_TABLET | Freq: Every evening | ORAL | 0 refills | Status: AC | PRN

## 2018-04-26 NOTE — Progress Notes (Signed)
[place] OFFICE PROGRESS NOTE  Patient Care Team: Valerie Roys, DO as PCP - General (Family Medicine) Telford Nab, RN as Registered Nurse Rogue Bussing, Elisha Headland, MD as Medical Oncologist (Medical Oncology)  Cancer Staging No matching staging information was found for the patient.   Oncology History   # July 2019- [facility] CA- RUL [s/p Liver Bx]-CT chest- RUL/ liver/ brain solitary lesion.  to contralateral lung; pleura; liver and brain.July 2019- A/P- liver metastases at least 2; bone scan shows right humeral; left fifth rib; L2 vertebral body metastases.  # July 31st- carbo-tax-Atezo+Avastin s/p 4 cycles; OCT 2019- CT Stable/PR   # NOV 2019- Acute respiratory faliure sec to immunotherapy induced- NOV 2019- S/p infliximab x2; prednisone to 40 mg/day; 11/18 cellcept 500 mg BID.   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  --------------------------------------------------------------------   DIAGNOSIS: Adenocarcinoma lung  STAGE: 4    ;GOALS: Palliative  CURRENT/MOST RECENT THERAPY: Off therapy/surveillance      Primary cancer of right upper lobe of lung ([place])   11/24/2017 -  Chemotherapy    The patient had bevacizumab (AVASTIN) 1,000 mg in sodium chloride 0.9 % 100 mL chemo infusion, 15 mg/kg = 1,000 mg, Intravenous,  Once, 4 of 4 cycles Administration: 1,000 mg (12/16/2017), 1,000 mg (01/19/2018), 1,000 mg (02/09/2018), 1,000 mg (03/03/2018)  for chemotherapy treatment.     12/09/2017 -  Chemotherapy    The patient had palonosetron (ALOXI) injection 0.25 mg, 0.25 mg, Intravenous,  Once, 4 of 4 cycles Administration: 0.25 mg (12/16/2017), 0.25 mg (01/19/2018), 0.25 mg (02/09/2018), 0.25 mg (03/03/2018) CARBOplatin (PARAPLATIN) 670 mg in sodium chloride 0.9 % 250 mL chemo infusion, 670 mg (100 % of original dose 667.8 mg), Intravenous,  Once, 4 of 4 cycles Dose modification:   (original dose 667.8 mg, Cycle  1, Reason: Provider Judgment) Administration: 670 mg (12/16/2017), 610 mg (01/19/2018), 610 mg (02/09/2018) PACLitaxel (TAXOL) 360 mg in sodium chloride 0.9 % 500 mL chemo infusion (> 50m/m2), 200 mg/m2 = 360 mg, Intravenous,  Once, 4 of 4 cycles Administration: 360 mg (12/16/2017), 360 mg (01/19/2018), 360 mg (02/09/2018), 360 mg (03/03/2018) atezolizumab (TECENTRIQ) 1,200 mg in sodium chloride 0.9 % 250 mL chemo infusion, 1,200 mg, Intravenous, Once, 4 of 4 cycles Administration: 1,200 mg (12/16/2017), 1,200 mg (01/19/2018), 1,200 mg (02/09/2018), 1,200 mg (03/03/2018)  for chemotherapy treatment.     INTERVAL HISTORY:  James Castiglia89y.o.  male of a history of metastatic adenocarcinoma the lung currently status post cycle #4 carbotaxol Avastin plus Tecentriq is here for follow-up; patient continues to be off any treatment given pneumonitis acute respiratory failure from immunotherapy.  Patient is currently at home.  He is needing 2 to 3 L of oxygen-saturation around 94%.  He does desaturate off oxygen.  Is currently on prednisone 40 mg a day.  Also on CellCept 500 mg twice a day.  His appetite is good.  However not gaining weight./Losing weight.  He continues to complain of anxiety/panic attacks.  Ativan seems to be helping.  Denies any significant pain.  Is currently working with physical therapy.  Review of Systems  Constitutional: Positive for malaise/fatigue and weight loss. Negative for chills, diaphoresis and fever.  HENT: Negative for nosebleeds and sore throat.   Eyes: Negative for double vision.  Respiratory: Positive for shortness of breath. Negative for hemoptysis, sputum production and wheezing.   Cardiovascular: Negative for chest pain, palpitations, orthopnea and leg swelling.  Gastrointestinal: Negative  for abdominal pain, blood in stool, constipation, diarrhea, heartburn, melena and vomiting.  Genitourinary: Negative for dysuria, frequency and urgency.  Musculoskeletal: Negative for  back pain and joint pain.  Skin: Negative.  Negative for itching and rash.  Neurological: Negative for dizziness, tingling, focal weakness, weakness and headaches.  Endo/Heme/Allergies: Does not bruise/bleed easily.  Psychiatric/Behavioral: Negative for depression. The patient is nervous/anxious. The patient does not have insomnia.       PAST MEDICAL HISTORY :  Past Medical History:  Diagnosis Date  . Cancer ([place]) 12/01/2017   Primary cancer of right upper lobe of lung. Mets to liver, brain.  Marland Kitchen Prepatellar bursitis of left knee    patient denies    PAST SURGICAL HISTORY :   Past Surgical History:  Procedure Laterality Date  . collapsed lung  2012   "had to reinflate" " i carried large piece of sheet rock up stairs by myself "   . [place]   x2 , second was with mesh   . TOTAL HIP ARTHROPLASTY Right 04/09/2017   Procedure: RIGHT TOTAL HIP ARTHROPLASTY ANTERIOR APPROACH;  Surgeon: Mcarthur Rossetti, MD;  Location: [facility];  Service: Orthopedics;  Laterality: Right;  . URETHRAL STRICTURE DILATATION      FAMILY HISTORY :  History reviewed. No pertinent family history.  SOCIAL HISTORY:   Social History   Tobacco Use  . Smoking status: Former Smoker    Types: Cigarettes    Last attempt to quit: 03/18/2012    Years since quitting: 6.1  . Smokeless tobacco: Never Used  . Tobacco comment: 5 years   Substance Use Topics  . Alcohol use: Yes    Comment: Socially  . Drug use: Yes    Types: Marijuana    Comment: 1 month ago     ALLERGIES:  has No Known Allergies.  MEDICATIONS:  Current Outpatient Medications  Medication Sig Dispense Refill  . albuterol (PROAIR HFA) 108 (90 Base) MCG/ACT inhaler Inhale 1-2 puffs into the lungs every 6 (six) hours as needed for wheezing or shortness of breath. 1 Inhaler 5  . budesonide (PULMICORT) 0.25 MG/2ML nebulizer solution Take 2 mLs (0.25 mg total) by nebulization 2 (two) times daily. 60 mL 12  . diltiazem  (CARDIZEM) 30 MG tablet Take 1 tablet (30 mg total) by mouth every 8 (eight) hours.    Marland Kitchen levalbuterol (XOPENEX) 1.25 MG/0.5ML nebulizer solution Take 0.63 mg by nebulization every 6 (six) hours as needed for wheezing or shortness of breath. 1 each 12  . LORazepam (ATIVAN) 1 MG tablet Take 1 tablet (1 mg total) by mouth at bedtime as needed for anxiety. 30 tablet 0  . metoprolol tartrate (LOPRESSOR) 25 MG tablet Take 0.5 tablets (12.5 mg total) by mouth 2 (two) times daily. 60 tablet 0  . Multiple Vitamin (MULTIVITAMIN) tablet Take 1 tablet by mouth daily.    . mycophenolate (CELLCEPT) 500 MG tablet Take 1 tablet (500 mg total) by mouth 2 (two) times daily. 60 tablet 0  . ondansetron (ZOFRAN) 8 MG tablet One pill every 8 hours as needed for nausea/vomitting. 40 tablet 1  . predniSONE (DELTASONE) 20 MG tablet Take 2 pills in the morning with food.;  Do not stop until directed. 90 tablet 0  . prochlorperazine (COMPAZINE) 10 MG tablet Take 1 tablet (10 mg total) by mouth every 6 (six) hours as needed for nausea or vomiting. 40 tablet 1  . tiotropium (SPIRIVA HANDIHALER) 18 MCG inhalation capsule INHALE 1 CAPSULE  VIA HANDIHALER ONCE DAILY AT THE SAME TIME EVERY DAY 30 capsule 3  . diazepam (VALIUM) 5 MG tablet Take 1 tablet (5 mg total) by mouth at bedtime as needed for anxiety. 30 tablet 0  . guaiFENesin (MUCINEX) 600 MG 12 hr tablet Take 1 tablet (600 mg total) by mouth 2 (two) times daily. (Patient not taking: Reported on 04/26/2018)    . risperiDONE (RISPERDAL M-TABS) 1 MG disintegrating tablet Take 1 tablet (1 mg total) by mouth every 8 (eight) hours as needed. For anxiety/restlessness (Patient not taking: Reported on 04/26/2018) 60 tablet 0  . traZODone (DESYREL) 100 MG tablet Take 1 tablet (100 mg total) by mouth at bedtime. (Patient not taking: Reported on 04/26/2018) 30 tablet 3   No current facility-administered medications for this visit.     PHYSICAL EXAMINATION: ECOG PERFORMANCE STATUS: 1  - Symptomatic but completely ambulatory  BP (!) 134/95 (BP Location: Left Arm, Patient Position: Sitting)   Pulse (!) 101   Temp 97.6 F (36.4 C) (Tympanic)   Resp (!) 22   Ht _0  (1.778 m)   Wt 119 lb 0.8 oz (54 kg)   SpO2 93% Comment: 2L  BMI 17.08 kg/m   Filed Weights   04/26/18 1002  Weight: 119 lb 0.8 oz (54 kg)    Physical Exam  Constitutional: He is oriented to person, place, and time.  Accompanied by his wife.  Cachectic appearing.  On nasal cannula oxygen.  He is in a wheelchair.  HENT:  Head: Normocephalic and atraumatic.  Mouth/Throat: Oropharynx is clear and moist. No oropharyngeal exudate.  Eyes: Pupils are equal, round, and reactive to light.  Neck: Normal range of motion. Neck supple.  Cardiovascular: Normal rate and regular rhythm.  Pulmonary/Chest: No respiratory distress. He has no wheezes.  Decreased air entry bilaterally.  Abdominal: Soft. Bowel sounds are normal. He exhibits no distension and no mass. There is no tenderness. There is no rebound and no guarding.  Musculoskeletal: Normal range of motion. He exhibits no edema or tenderness.  Neurological: He is alert and oriented to person, place, and time.  Skin: Skin is warm.  Psychiatric: Affect normal.  Appears upset.    LABORATORY DATA:  I have reviewed the data as listed    Component Value Date/Time   NA 137 04/26/2018 0951   NA 138 08/24/2017 1651   NA 137 04/23/2013 1330   K 4.1 04/26/2018 0951   K 3.8 04/23/2013 1330   CL 103 04/26/2018 0951   CL 105 04/23/2013 1330   CO2 25 04/26/2018 0951   CO2 22 04/23/2013 1330   GLUCOSE 113 (H) 04/26/2018 0951   GLUCOSE 120 (H) 04/23/2013 1330   BUN 24 (H) 04/26/2018 0951   BUN 18 08/24/2017 1651   BUN 18 04/23/2013 1330   CREATININE 0.63 04/26/2018 0951   CREATININE 0.90 04/23/2013 1330   CALCIUM 8.7 (L) 04/26/2018 0951   CALCIUM 9.3 04/23/2013 1330   PROT 6.0 (L) 04/26/2018 0951   PROT 6.8 08/24/2017 1651   PROT 7.5 04/23/2013 1330    ALBUMIN 3.3 (L) 04/26/2018 0951   ALBUMIN 4.2 08/24/2017 1651   ALBUMIN 3.8 04/23/2013 1330   AST 48 (H) 04/26/2018 0951   AST 11 (L) 04/23/2013 1330   ALT 103 (H) 04/26/2018 0951   ALT 18 04/23/2013 1330   ALKPHOS 101 04/26/2018 0951   ALKPHOS 64 04/23/2013 1330   BILITOT 1.0 04/26/2018 0951   BILITOT 0.2 08/24/2017 1651   BILITOT 0.6 04/23/2013 1330  GFRNONAA >60 04/26/2018 0951   GFRNONAA >60 04/23/2013 1330   GFRAA >60 04/26/2018 0951   GFRAA >60 04/23/2013 1330    No results found for: SPEP, UPEP  Lab Results  Component Value Date   WBC 10.9 (H) 04/26/2018   NEUTROABS 9.7 (H) 04/26/2018   HGB 12.4 (L) 04/26/2018   HCT 37.2 (L) 04/26/2018   MCV 96.9 04/26/2018   PLT 154 04/26/2018      Chemistry      Component Value Date/Time   NA 137 04/26/2018 0951   NA 138 08/24/2017 1651   NA 137 04/23/2013 1330   K 4.1 04/26/2018 0951   K 3.8 04/23/2013 1330   CL 103 04/26/2018 0951   CL 105 04/23/2013 1330   CO2 25 04/26/2018 0951   CO2 22 04/23/2013 1330   BUN 24 (H) 04/26/2018 0951   BUN 18 08/24/2017 1651   BUN 18 04/23/2013 1330   CREATININE 0.63 04/26/2018 0951   CREATININE 0.90 04/23/2013 1330      Component Value Date/Time   CALCIUM 8.7 (L) 04/26/2018 0951   CALCIUM 9.3 04/23/2013 1330   ALKPHOS 101 04/26/2018 0951   ALKPHOS 64 04/23/2013 1330   AST 48 (H) 04/26/2018 0951   AST 11 (L) 04/23/2013 1330   ALT 103 (H) 04/26/2018 0951   ALT 18 04/23/2013 1330   BILITOT 1.0 04/26/2018 0951   BILITOT 0.2 08/24/2017 1651   BILITOT 0.6 04/23/2013 1330       RADIOGRAPHIC STUDIES: I have personally reviewed the radiological images as listed and agreed with the findings in the report. Dg Chest 2 View  Result Date: 04/26/2018 CLINICAL DATA:  Follow-up x-ray or right upper lobe cancer/pneumonitis from immunotherapy. EXAM: CHEST - 2 VIEW COMPARISON:  04/19/2018. FINDINGS: Mediastinum hilar structures are stable. Heart size stable. Stable bilateral interstitial  prominence and nodularity again noted unchanged from prior exams. No pleural effusion or pneumothorax. Degenerative change thoracic spine. IMPRESSION: Stable bilateral interstitial prominence and nodularity again noted without interim change from prior exam. Findings are again consistent with pneumonitis. Electronically Signed   By: Marcello Moores  Register   On: 04/26/2018 11:33     ASSESSMENT & PLAN:  Primary cancer of right upper lobe of lung ([place]) #Lung adenocarcinoma-stage IV; S/p carbotaxol plus Avastin plus Tecentriq cycle # 4; CT scan NOV 2019-partial response/stable.  However unfortunately clinical course complicated by pneumonitis/immunotherapy mediated [see discussion below].   # continue to HOLD further systemic therapy given acute issues [see below].   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  Continue prednisone to 40 mg/day; continue cellcept 500 mg BID.  Chest x-ray recently 1 week ago throat slight improvement.  Await chest x-ray today  # Bone mets- X-geva continue-  calcium vitamin D.  Stable  #Weight loss-continue prednisone.  Recommend talking to [facility].  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  Stable  # Panic attacks/ anxiety/ insominia-Stopped Trazadone- ? Nightamares.  Not controlled well.  Continue ativan 0.5 mg BID prn.  Recommend adding valium 5 mg qhs.   #Mild elevation of LFTs normal bilirubin-question drug-induced. Continue cellcept.   #Patient upset over the clinical course; the fact that he had to go through chemotherapy- " just to feel poorly as he is right now".  I had a long discussion with patient and family regarding the difficulty in predicting severe side effects from treatments.  Discussed  that without treatment life expectancy would be in the order of few months with brain metastases.  I again explained to the  patient and family that "median life expectancy"-does not belong to individual patient.  Unfortunately, the severe pneumonitis patient experiencing is usually less than 10%.   # DISPOSITION:  # get CXR today. # follow up in 1 week/labs-CMP- Dr.B   # 40 minutes face-to-face with the patient discussing the above plan of care; more than 50% of time spent on prognosis/ natural history; counseling and coordination.     Orders Placed This Encounter  Procedures  . DG Chest 2 View    Standing Status:   Future    Number of Occurrences:   1    Standing Expiration Date:   06/26/2019    Order Specific Question:   Reason for Exam (SYMPTOM  OR DIAGNOSIS REQUIRED)    Answer:   pneumonitis from immunotherapy    Order Specific Question:   Preferred imaging location?    Answer:   [facility]  . Comprehensive metabolic panel    Standing Status:   Future    Standing Expiration Date:   04/26/2019   All questions were answered. The patient knows to call the clinic with any problems, questions or concerns.      James Sickle, MD 04/26/2018 12:16 PM

## 2018-04-26 NOTE — Progress Notes (Addendum)
Patient expressing anger and grief over his present quality of life and declination of health. Active listening provided by RN. Wife tearful in exam room. Pt states that he is "very angry that I am in this condition. I wish I had never taken any chemotherapy. Had I known that my life would be like it is now, I would have never agreed to sign up for this. I've lost over 50 lbs since being diagnosed with cancer. I have no, well absolutely no quality of life right now. I don't really want to be on Cellcept any longer. Here I am on oxygen. I can't get out of bed. I can barely have the strength to open my water bottle."  Patient admits to not sleeping for 72 hours this week due to panic attacks and anxiety. Patient not taking Risperdal. Wife States that they never received a msg that the script was at the pharmacy to be picked up.  Patient not taking the Trazadone. Wife states that the trazadone 'did not help.' patient taking ativan 1 mg daily to reduce anxiety. Wife asking if valium could be added to patient's medication list.

## 2018-04-26 NOTE — Telephone Encounter (Signed)
It looks like Dr. Patrecia Pace wrote those Rxs, but he can increase the rispiradone to 2mg  qHS and see how that goes.

## 2018-04-26 NOTE — Assessment & Plan Note (Addendum)
#  Lung adenocarcinoma-stage IV; S/p carbotaxol plus Avastin plus Tecentriq cycle # 4; CT scan NOV 2019-partial response/stable.  However unfortunately clinical course complicated by pneumonitis/immunotherapy mediated [see discussion below].   # continue to HOLD further systemic therapy given acute issues [see below].   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  Continue prednisone to 40 mg/day; continue cellcept 500 mg BID.  Chest x-ray recently 1 week ago throat slight improvement.  Await chest x-ray today  # Bone mets- X-geva continue-  calcium vitamin D.  Stable  #Weight loss-continue prednisone.  Recommend talking to [facility].  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  Stable  # Panic attacks/ anxiety/ insominia-Stopped Trazadone- ? Nightamares.  Not controlled well.  Continue ativan 0.5 mg BID prn.  Recommend adding valium 5 mg qhs.   #Mild elevation of LFTs normal bilirubin-question drug-induced. Continue cellcept.   #Patient upset over the clinical course; the fact that he had to go through chemotherapy- " just to feel poorly as he is right now".  I had a long discussion with patient and family regarding the difficulty in predicting severe side effects from treatments.  Discussed that without treatment life expectancy would be in the order of few months with brain metastases.  I again explained to the patient and family that "median life expectancy"-does not belong to individual patient.  Unfortunately, the severe pneumonitis patient experiencing is usually less than 10%.   # DISPOSITION:  # get CXR today. # follow up in 1 week/labs-CMP- Dr.B   # 40 minutes face-to-face with the patient discussing the above plan of care; more than 50% of time spent on prognosis/ natural history; counseling and coordination.

## 2018-04-26 NOTE — Telephone Encounter (Signed)
Wife returned my phone [phone]. Results provided of chest xray

## 2018-04-26 NOTE — Telephone Encounter (Signed)
Please inform patient/wife-the chest x-ray is stable from last week [neither significantly better or worse].  Continue follow-up as recommended.

## 2018-04-26 NOTE — Telephone Encounter (Signed)
Message relayed to patient. Verbalized understanding and denied questions.   

## 2018-04-26 NOTE — Telephone Encounter (Signed)
Attempted to reach. Could not get ahold of [place]

## 2018-04-26 NOTE — Telephone Encounter (Signed)
Left vm x 2. 1 on the patient's cell phone the other on his wife's cell phone. To call our office to discuss test results.

## 2018-04-26 NOTE — Telephone Encounter (Signed)
Called Dr. Jacinto Reap- saw him today, patient very upset as is having bad reaction to his immunotherapy, and prognosis is not great. Has been having a lot of trouble with his stress with panic attacks, not coping well with his diagnosis. Dr. B started him on valium today at night and increased his ativan to BID- not on the trazodone or the rieperidone any more.   If You are able to get in touch with [facility]- the below no longer makes sense. If it doesn't seem to help, he should come in for an appointment and we can see what we can do about his anxiety. Thanks.

## 2018-04-28 ENCOUNTER — Telehealth: Payer: Self-pay | Admitting: *Deleted

## 2018-04-28 ENCOUNTER — Inpatient Hospital Stay (HOSPITAL_BASED_OUTPATIENT_CLINIC_OR_DEPARTMENT_OTHER): Payer: BC Managed Care – PPO | Admitting: Nurse Practitioner

## 2018-04-28 ENCOUNTER — Encounter: Payer: Self-pay | Admitting: Nurse Practitioner

## 2018-04-28 VITALS — BP 112/80 | HR 101 | Temp 98.3°F

## 2018-04-28 DIAGNOSIS — F419 Anxiety disorder, unspecified: Secondary | ICD-10-CM

## 2018-04-28 DIAGNOSIS — J702 Acute drug-induced interstitial lung disorders: Secondary | ICD-10-CM | POA: Diagnosis not present

## 2018-04-28 DIAGNOSIS — C3411 Malignant neoplasm of upper lobe, right bronchus or lung: Secondary | ICD-10-CM

## 2018-04-28 DIAGNOSIS — C801 Malignant (primary) neoplasm, unspecified: Secondary | ICD-10-CM

## 2018-04-28 DIAGNOSIS — T50905A Adverse effect of unspecified drugs, medicaments and biological substances, initial encounter: Secondary | ICD-10-CM

## 2018-04-28 DIAGNOSIS — F418 Other specified anxiety disorders: Secondary | ICD-10-CM

## 2018-04-28 DIAGNOSIS — J189 Pneumonia, unspecified organism: Secondary | ICD-10-CM

## 2018-04-28 NOTE — Progress Notes (Signed)
Symptom Management [place]  [phone] [phone]  Patient Care Team: Valerie Roys, DO as PCP - General (Family Medicine) Telford Nab, RN as Registered Nurse Cammie Sickle, MD as Medical Oncologist (Medical Oncology)   Name of the patient: James [name]  [id number]  Oct 22, 1955   Date of visit: 04/28/18  Diagnosis- lung adenocarcinoma  Chief complaint/ Reason for visit- Wheezing  Heme/Onc history:  Oncology History   # July 2019- [facility] CA- RUL [s/p Liver Bx]-CT chest- RUL/ liver/ brain solitary lesion.  to contralateral lung; pleura; liver and brain.July 2019- A/P- liver metastases at least 2; bone scan shows right humeral; left fifth rib; L2 vertebral body metastases.  # July 31st- carbo-tax-Atezo+Avastin s/p 4 cycles; OCT 2019- CT Stable/PR   # NOV 2019- Acute respiratory faliure sec to immunotherapy induced- NOV 2019- S/p infliximab x2; prednisone to 40 mg/day; 11/18 cellcept 500 mg BID.   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  --------------------------------------------------------------------   DIAGNOSIS: Adenocarcinoma lung  STAGE: 4    ;GOALS: Palliative  CURRENT/MOST RECENT THERAPY: Off therapy/surveillance      Primary cancer of right upper lobe of lung ([place])   11/24/2017 -  Chemotherapy    The patient had bevacizumab (AVASTIN) 1,000 mg in sodium chloride 0.9 % 100 mL chemo infusion, 15 mg/kg = 1,000 mg, Intravenous,  Once, 4 of 4 cycles Administration: 1,000 mg (12/16/2017), 1,000 mg (01/19/2018), 1,000 mg (02/09/2018), 1,000 mg (03/03/2018)  for chemotherapy treatment.     12/09/2017 -  Chemotherapy    The patient had palonosetron (ALOXI) injection 0.25 mg, 0.25 mg, Intravenous,  Once, 4 of 4 cycles Administration: 0.25 mg (12/16/2017), 0.25 mg (01/19/2018), 0.25 mg (02/09/2018), 0.25 mg (03/03/2018) CARBOplatin (PARAPLATIN) 670 mg in  sodium chloride 0.9 % 250 mL chemo infusion, 670 mg (100 % of original dose 667.8 mg), Intravenous,  Once, 4 of 4 cycles Dose modification:   (original dose 667.8 mg, Cycle 1, Reason: Provider Judgment) Administration: 670 mg (12/16/2017), 610 mg (01/19/2018), 610 mg (02/09/2018) PACLitaxel (TAXOL) 360 mg in sodium chloride 0.9 % 500 mL chemo infusion (> 80mg /m2), 200 mg/m2 = 360 mg, Intravenous,  Once, 4 of 4 cycles Administration: 360 mg (12/16/2017), 360 mg (01/19/2018), 360 mg (02/09/2018), 360 mg (03/03/2018) atezolizumab (TECENTRIQ) 1,200 mg in sodium chloride 0.9 % 250 mL chemo infusion, 1,200 mg, Intravenous, Once, 4 of 4 cycles Administration: 1,200 mg (12/16/2017), 1,200 mg (01/19/2018), 1,200 mg (02/09/2018), 1,200 mg (03/03/2018)  for chemotherapy treatment.      Interval history- James [name], 62 year old male, with above history of lung adenocarcinoma stage IV, who presents to symptom management clinic for wheezing.  He states that his home physical therapist noticed wheezing today during exercise.  He has not noticed this previously.  Patient had not noticed symptoms but felt he should be evaluated.  He states that he has had some shortness of breath and anxiety recently and that anxiety often causes shortness of breath.  He has not noticed any URI-like symptoms.  Suffered acute respiratory failure secondary to immunotherapy with history of COPD previously.  Has not yet returned to baseline but feels he continues to improve.  Continues home oxygen and steroids. Chest x-ray on 04/26/2018 were again consistent with pneumonitis.  Plan to repeat chest x-ray on 05/03/2018.  Immunotherapy is currently held. He was previously prescribed Ativan and Valium which he has taken intermittently. He says he does feel restless during  day d/t his performance decline/limitations.   ECOG FS:2 - Symptomatic, <50% confined to bed  Review of systems- Review of Systems  Constitutional: Positive for malaise/fatigue.  Negative for chills, fever and weight loss.  HENT: Negative for congestion, ear discharge, ear pain, sinus pain, sore throat and tinnitus.   Eyes: Negative.   Respiratory: Positive for cough and shortness of breath. Negative for sputum production and wheezing.   Cardiovascular: Negative for chest pain, palpitations, orthopnea, claudication and leg swelling.  Gastrointestinal: Negative for abdominal pain, blood in stool, constipation, diarrhea, heartburn, nausea and vomiting.  Genitourinary: Negative.   Musculoskeletal: Negative.   Skin: Negative.   Neurological: Negative for dizziness, tingling, weakness and headaches.  Endo/Heme/Allergies: Negative.   Psychiatric/Behavioral: Positive for depression. Negative for memory loss, substance abuse and suicidal ideas. The patient is nervous/anxious and has insomnia.     No Known Allergies  Past Medical History:  Diagnosis Date  . Cancer ([place]) 12/01/2017   Primary cancer of right upper lobe of lung. Mets to liver, brain.  Marland Kitchen Prepatellar bursitis of left knee    patient denies    Past Surgical History:  Procedure Laterality Date  . collapsed lung  2012   "had to reinflate" " i carried large piece of sheet rock up stairs by myself "   . [place]   x2 , second was with mesh   . TOTAL HIP ARTHROPLASTY Right 04/09/2017   Procedure: RIGHT TOTAL HIP ARTHROPLASTY ANTERIOR APPROACH;  Surgeon: Mcarthur Rossetti, MD;  Location: [facility];  Service: Orthopedics;  Laterality: Right;  . URETHRAL STRICTURE DILATATION      Social History   Socioeconomic History  . Marital status: Married    Spouse name: Not on file  . Number of children: Not on file  . Years of education: Not on file  . Highest education level: Not on file  Occupational History  . Not on file  Social Needs  . Financial resource strain: Not on file  . Food insecurity:    Worry: Not on file    Inability: Not on file  . Transportation needs:    Medical:  Not on file    Non-medical: Not on file  Tobacco Use  . Smoking status: Former Smoker    Types: Cigarettes    Last attempt to quit: 03/18/2012    Years since quitting: 6.1  . Smokeless tobacco: Never Used  . Tobacco comment: 5 years   Substance and Sexual Activity  . Alcohol use: Yes    Comment: Socially  . Drug use: Yes    Types: Marijuana    Comment: 1 month ago   . Sexual activity: Not Currently  Lifestyle  . Physical activity:    Days per week: Not on file    Minutes per session: Not on file  . Stress: Not on file  Relationships  . Social connections:    Talks on phone: Not on file    Gets together: Not on file    Attends religious service: Not on file    Active member of club or organization: Not on file    Attends meetings of clubs or organizations: Not on file    Relationship status: Not on file  . Intimate partner violence:    Fear of current or ex partner: Not on file    Emotionally abused: Not on file    Physically abused: Not on file    Forced sexual activity: Not on file  Other  Topics Concern  . Not on file  Social History Narrative   Father and mother with hypertension.     History reviewed. No pertinent family history.   Current Outpatient Medications:  .  albuterol (PROAIR HFA) 108 (90 Base) MCG/ACT inhaler, Inhale 1-2 puffs into the lungs every 6 (six) hours as needed for wheezing or shortness of breath., Disp: 1 Inhaler, Rfl: 5 .  Ascorbic Acid (VITAMIN C) 1000 MG tablet, Take 1,000 mg by mouth daily., Disp: , Rfl:  .  budesonide (PULMICORT) 0.25 MG/2ML nebulizer solution, Take 2 mLs (0.25 mg total) by nebulization 2 (two) times daily., Disp: 60 mL, Rfl: 12 .  diltiazem (CARDIZEM) 30 MG tablet, Take 1 tablet (30 mg total) by mouth every 8 (eight) hours., Disp: , Rfl:  .  guaiFENesin (MUCINEX) 600 MG 12 hr tablet, Take 1 tablet (600 mg total) by mouth 2 (two) times daily., Disp: , Rfl:  .  LORazepam (ATIVAN) 1 MG tablet, Take 1 tablet (1 mg total) by  mouth at bedtime as needed for anxiety., Disp: 30 tablet, Rfl: 0 .  metoprolol tartrate (LOPRESSOR) 25 MG tablet, Take 0.5 tablets (12.5 mg total) by mouth 2 (two) times daily., Disp: 60 tablet, Rfl: 0 .  Multiple Vitamin (MULTIVITAMIN) tablet, Take 1 tablet by mouth daily., Disp: , Rfl:  .  mycophenolate (CELLCEPT) 500 MG tablet, Take 1 tablet (500 mg total) by mouth 2 (two) times daily., Disp: 60 tablet, Rfl: 0 .  predniSONE (DELTASONE) 20 MG tablet, Take 2 pills in the morning with food.;  Do not stop until directed., Disp: 90 tablet, Rfl: 0 .  tiotropium (SPIRIVA HANDIHALER) 18 MCG inhalation capsule, INHALE 1 CAPSULE VIA HANDIHALER ONCE DAILY AT THE SAME TIME EVERY DAY, Disp: 30 capsule, Rfl: 3 .  diazepam (VALIUM) 5 MG tablet, Take 1 tablet (5 mg total) by mouth at bedtime as needed for anxiety. (Patient not taking: Reported on 04/28/2018), Disp: 30 tablet, Rfl: 0 .  levalbuterol (XOPENEX) 1.25 MG/0.5ML nebulizer solution, Take 0.63 mg by nebulization every 6 (six) hours as needed for wheezing or shortness of breath. (Patient not taking: Reported on 04/28/2018), Disp: 1 each, Rfl: 12 .  ondansetron (ZOFRAN) 8 MG tablet, One pill every 8 hours as needed for nausea/vomitting. (Patient not taking: Reported on 04/28/2018), Disp: 40 tablet, Rfl: 1 .  prochlorperazine (COMPAZINE) 10 MG tablet, Take 1 tablet (10 mg total) by mouth every 6 (six) hours as needed for nausea or vomiting. (Patient not taking: Reported on 04/28/2018), Disp: 40 tablet, Rfl: 1 .  risperiDONE (RISPERDAL M-TABS) 1 MG disintegrating tablet, Take 1 tablet (1 mg total) by mouth every 8 (eight) hours as needed. For anxiety/restlessness (Patient not taking: Reported on 04/26/2018), Disp: 60 tablet, Rfl: 0 .  traZODone (DESYREL) 100 MG tablet, Take 1 tablet (100 mg total) by mouth at bedtime. (Patient not taking: Reported on 04/26/2018), Disp: 30 tablet, Rfl: 3  Physical exam:  Vitals:   04/28/18 1418  BP: 112/80  Pulse: (!) 101    Temp: 98.3 F (36.8 C)  TempSrc: Tympanic   Physical Exam Constitutional:      General: He is not in acute distress.    Comments: Accompanied by wife; on oxygen in recliner  HENT:     Head: Normocephalic and atraumatic.     Mouth/Throat:     Mouth: Mucous membranes are moist.     Pharynx: Oropharynx is clear.  Eyes:     Extraocular Movements: Extraocular movements intact.  Neck:  Musculoskeletal: Normal range of motion and neck supple.  Cardiovascular:     Rate and Rhythm: Normal rate and regular rhythm.     Pulses: Normal pulses.     Heart sounds: Normal heart sounds.  Pulmonary:     Effort: Pulmonary effort is normal.     Breath sounds: No stridor. Decreased breath sounds present. No wheezing, rhonchi or rales.  Abdominal:     Palpations: Abdomen is soft.     Tenderness: There is no abdominal tenderness.  Musculoskeletal:     Right lower leg: No edema.     Left lower leg: No edema.  Skin:    General: Skin is warm and dry.  Neurological:     Mental Status: He is alert.  Psychiatric:        Mood and Affect: Mood is anxious.        Behavior: Behavior is agitated (improved during appt).      CMP Latest Ref Rng & Units 04/26/2018  Glucose 70 - 99 mg/dL 113(H)  BUN 8 - 23 mg/dL 24(H)  Creatinine 0.61 - 1.24 mg/dL 0.63  Sodium 135 - 145 mmol/L 137  Potassium 3.5 - 5.1 mmol/L 4.1  Chloride 98 - 111 mmol/L 103  CO2 22 - 32 mmol/L 25  Calcium 8.9 - 10.3 mg/dL 8.7(L)  Total Protein 6.5 - 8.1 g/dL 6.0(L)  Total Bilirubin 0.3 - 1.2 mg/dL 1.0  Alkaline Phos 38 - 126 U/L 101  AST 15 - 41 U/L 48(H)  ALT 0 - 44 U/L 103(H)   CBC Latest Ref Rng & Units 04/26/2018  WBC 4.0 - 10.5 K/uL 10.9(H)  Hemoglobin 13.0 - 17.0 g/dL 12.4(L)  Hematocrit 39.0 - 52.0 % 37.2(L)  Platelets 150 - 400 K/uL 154    No images are attached to the encounter.  Dg Chest 2 View  Result Date: 04/26/2018 CLINICAL DATA:  Follow-up x-ray or right upper lobe cancer/pneumonitis from  immunotherapy. EXAM: CHEST - 2 VIEW COMPARISON:  04/19/2018. FINDINGS: Mediastinum hilar structures are stable. Heart size stable. Stable bilateral interstitial prominence and nodularity again noted unchanged from prior exams. No pleural effusion or pneumothorax. Degenerative change thoracic spine. IMPRESSION: Stable bilateral interstitial prominence and nodularity again noted without interim change from prior exam. Findings are again consistent with pneumonitis. Electronically Signed   By: Marcello Moores  Register   On: 04/26/2018 11:33   Dg Chest 2 View  Result Date: 04/19/2018 CLINICAL DATA:  Right upper lobe lung malignancy, nonproductive cough, follow-up from recent episode of pneumonitis. Persistent shortness of breath. EXAM: CHEST - 2 VIEW COMPARISON:  PA and lateral chest x-ray of April 08, 2018 FINDINGS: The lungs are well-expanded. Patchy airspace opacities demonstrated on the left have improved but have not cleared. There is stable patchy density peripherally in the right mid upper lung. The heart and pulmonary vascularity are normal. The mediastinum is normal in width. The bony thorax is unremarkable. IMPRESSION: Interval improvement in presumed pneumonitis related interstitial densities in the left lung. Fairly stable appearance of the right lung. An additional follow-up chest x-ray in 2-3 weeks is recommended assuming the patient is clinically improving. The patient's symptoms are stable or worsening, repeat chest CT scanning would be useful. Electronically Signed   By: David  Martinique M.D.   On: 04/19/2018 10:43   Dg Chest 2 View  Result Date: 04/08/2018 CLINICAL DATA:  Stage IV lung cancer EXAM: CHEST - 2 VIEW COMPARISON:  04/02/2018 FINDINGS: Diffuse left lung airspace disease and patchy right lung opacities again noted,  unchanged. Heart is normal size. Underlying COPD. No visible effusions or acute bony abnormality. IMPRESSION: Stable asymmetric airspace disease, left greater than right.  Electronically Signed   By: Rolm Baptise M.D.   On: 04/08/2018 10:01   Dg Chest 2 View  Result Date: 04/02/2018 CLINICAL DATA:  Stage IV lung cancer of right upper lobe with mets to liver and brain. Hx of COPD and collapsed lung- 2012. Admitted to hospital 03/24/2018 with fevers and hypoxic respiratory failure. Former smoker. EXAM: CHEST - 2 VIEW COMPARISON:  03/30/2018 FINDINGS: Coarse somewhat nodular airspace opacities throughout anterior left upper lobe and lingula, left lung base, and in the right infrahilar region, with slight worsening since previous exam. Heart size and mediastinal contours are within normal limits. There is blunting of posterior costophrenic angles suggesting small effusions. Visualized bones unremarkable. IMPRESSION: 1. Slight worsening of asymmetric airspace disease. 2. Possible small pleural effusions. Electronically Signed   By: Lucrezia Europe M.D.   On: 04/02/2018 10:19   Dg Chest 2 View  Result Date: 03/30/2018 CLINICAL DATA:  Fever and shortness of breath for the past 2 days with clinical pneumonia. 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 EXAM: CHEST - 2 VIEW COMPARISON:  Portable chest x-ray of March 27, 2018 FINDINGS: The lungs are adequately inflated. There has been further interval increase in the interstitial infiltrates in the left lung. There is subtle increased density in the right upper lobe which is stable. There is no mediastinal shift. The heart and pulmonary vascularity are normal. The bony thorax exhibits no acute abnormality. IMPRESSION: Slightly increased interstitial densities throughout the left lung worrisome for pneumonia. Fairly stable density in the right pulmonary apex. No no overt CHF. Electronically Signed   By: David  Martinique M.D.   On: 03/30/2018 09:03   Ct Head Wo Contrast  Result Date: 03/30/2018 CLINICAL DATA:  In cephalopathy. History of metastatic lung cancer. EXAM: CT HEAD WITHOUT CONTRAST TECHNIQUE:  Contiguous axial images were obtained from the base of the skull through the vertex without intravenous contrast. COMPARISON:  Brain MRI 02/24/2018 FINDINGS: Brain: Small focus of low density in the medial right occipital lobe corresponds to a previously treated metastasis. No intracranial mass effect, hemorrhage, acute infarct, or extra-axial fluid collection is identified. Mild cerebral atrophy is within normal limits for age. Vascular: Calcified atherosclerosis at the skull base. No hyperdense vessel. Skull: No fracture or focal osseous lesion. Sinuses/Orbits: Visualized paranasal sinuses and mastoid air cells are clear. Visualized orbits are unremarkable. Other: None. IMPRESSION: No evidence of acute intracranial abnormality. Electronically Signed   By: Logan Bores M.D.   On: 03/30/2018 11:01    Assessment and plan- Patient is a 62 y.o. male diagnosed with lung cancer, who presents to symptom management clinic for wheezing and anxiety.   1.  Lung adenocarcinoma-stage IV-status post carbotaxol plus Avastin plus Tecentriq cycle 4.  CT on 03/2018 showed partial/stable response.  Unfortunately suffered pneumonitis related to immunotherapy and systemic therapy currently on hold.  Has not yet returned to baseline.  On prednisone and CellCept.  Chest x-ray last week essentially stable with mild improvement.  Currently on oxygen and receiving home PT and OT.  2.  Wheezing-no wheezing noted on exam today.  Suspect patient became anxious and acutely short of breath which is since resolved (see below).  Continue to monitor.   3. Anxiety - r/t increased debility.  Lengthy conversation today regarding various coping mechanisms and use of medications.  Has not  been taking antianxiety medications consistently which results in exacerbations of symptoms.  Encourage patient to do trial to assess response.  Could also consider long-acting antianxiety medication such as Paxil vs others.   Follow-up with Dr. Rogue Bussing  as scheduled for labs, reevaluation, and chest x-ray on 05/03/2018. Patient advised to notify the clinic if there is no improvement in symptoms or if symptoms worsen.   Visit Diagnosis 1. Primary cancer of right upper lobe of lung ([place])   2. Drug-induced pneumonitis   3. Anxiety associated with cancer diagnosis     Patient expressed understanding and was in agreement with this plan. He also understands that He can call clinic at any time with any questions, concerns, or complaints.   Thank you for allowing me to participate in the care of this very pleasant patient.   A total of (25) minutes of face-to-face time was spent with this patient with greater than 50% of that time in counseling and care-coordination.  Beckey Rutter, DNP, AGNP-C [facility] [facility] [phone] (work cell) [phone] (office)

## 2018-04-28 NOTE — Telephone Encounter (Signed)
Call from Baldwin Crown, PT with [facility] reporting that patient is not feeling well, temp 95, P- >90 R <20, and he has High pitched wheezing bilaterally. He just had labs drawn 04/26/18.   Appointment accepted for 2 PM today

## 2018-05-03 ENCOUNTER — Other Ambulatory Visit: Payer: Self-pay | Admitting: Internal Medicine

## 2018-05-03 ENCOUNTER — Inpatient Hospital Stay: Payer: BC Managed Care – PPO

## 2018-05-03 ENCOUNTER — Encounter: Payer: Self-pay | Admitting: Internal Medicine

## 2018-05-03 ENCOUNTER — Other Ambulatory Visit: Payer: Self-pay

## 2018-05-03 ENCOUNTER — Other Ambulatory Visit: Payer: Self-pay | Admitting: *Deleted

## 2018-05-03 ENCOUNTER — Ambulatory Visit
Admission: RE | Admit: 2018-05-03 | Discharge: 2018-05-03 | Disposition: A | Discharge status: Home / Self Care | Payer: BC Managed Care – PPO | Source: Ambulatory Visit | Attending: Internal Medicine | Admitting: Internal Medicine

## 2018-05-03 ENCOUNTER — Ambulatory Visit
Admission: RE | Admit: 2018-05-03 | Discharge: 2018-05-03 | Disposition: A | Discharge status: Home / Self Care | Payer: BC Managed Care – PPO | Attending: Internal Medicine | Admitting: Internal Medicine

## 2018-05-03 ENCOUNTER — Inpatient Hospital Stay (HOSPITAL_BASED_OUTPATIENT_CLINIC_OR_DEPARTMENT_OTHER): Payer: BC Managed Care – PPO | Admitting: Internal Medicine

## 2018-05-03 ENCOUNTER — Telehealth: Payer: Self-pay | Admitting: Internal Medicine

## 2018-05-03 VITALS — BP 118/82 | HR 99 | Temp 99.2°F | Resp 20 | Ht 70.0 in | Wt 124.5 lb

## 2018-05-03 DIAGNOSIS — C7931 Secondary malignant neoplasm of brain: Secondary | ICD-10-CM | POA: Diagnosis not present

## 2018-05-03 DIAGNOSIS — C3411 Malignant neoplasm of upper lobe, right bronchus or lung: Secondary | ICD-10-CM

## 2018-05-03 DIAGNOSIS — F410 Panic disorder [episodic paroxysmal anxiety] without agoraphobia: Secondary | ICD-10-CM

## 2018-05-03 DIAGNOSIS — T50905A Adverse effect of unspecified drugs, medicaments and biological substances, initial encounter: Secondary | ICD-10-CM | POA: Insufficient documentation

## 2018-05-03 DIAGNOSIS — J702 Acute drug-induced interstitial lung disorders: Secondary | ICD-10-CM | POA: Diagnosis not present

## 2018-05-03 DIAGNOSIS — J189 Pneumonia, unspecified organism: Secondary | ICD-10-CM

## 2018-05-03 DIAGNOSIS — C7951 Secondary malignant neoplasm of bone: Secondary | ICD-10-CM

## 2018-05-03 DIAGNOSIS — Z7952 Long term (current) use of systemic steroids: Secondary | ICD-10-CM

## 2018-05-03 DIAGNOSIS — Z9221 Personal history of antineoplastic chemotherapy: Secondary | ICD-10-CM

## 2018-05-03 DIAGNOSIS — Z79899 Other long term (current) drug therapy: Secondary | ICD-10-CM

## 2018-05-03 DIAGNOSIS — G720 Drug-induced myopathy: Secondary | ICD-10-CM

## 2018-05-03 LAB — COMPREHENSIVE METABOLIC PANEL
ALT: 120 U/L — ABNORMAL HIGH (ref 0–44)
AST: 55 U/L — ABNORMAL HIGH (ref 15–41)
Albumin: 3.2 g/dL — ABNORMAL LOW (ref 3.5–5.0)
Alkaline Phosphatase: 106 U/L (ref 38–126)
Anion gap: 8 (ref 5–15)
BILIRUBIN TOTAL: 0.7 mg/dL (ref 0.3–1.2)
BUN: 26 mg/dL — AB (ref 8–23)
CHLORIDE: 103 mmol/L (ref 98–111)
CO2: 26 mmol/L (ref 22–32)
Calcium: 8.8 mg/dL — ABNORMAL LOW (ref 8.9–10.3)
Creatinine, Ser: 0.66 mg/dL (ref 0.61–1.24)
Glucose, Bld: 158 mg/dL — ABNORMAL HIGH (ref 70–99)
POTASSIUM: 3.6 mmol/L (ref 3.5–5.1)
Sodium: 137 mmol/L (ref 135–145)
TOTAL PROTEIN: 6.1 g/dL — AB (ref 6.5–8.1)

## 2018-05-03 MED ORDER — LEVALBUTEROL HCL 1.25 MG/3ML IN NEBU: INHALATION_SOLUTION | RESPIRATORY_TRACT | 3 refills | Status: AC

## 2018-05-03 MED ORDER — LEVALBUTEROL HCL 1.25 MG/0.5ML IN NEBU: 0.6300 mg | INHALATION_SOLUTION | Freq: Four times a day (QID) | RESPIRATORY_TRACT | 12 refills | Status: DC | PRN

## 2018-05-03 MED ORDER — DILTIAZEM HCL 30 MG PO TABS: 30.0000 mg | ORAL_TABLET | Freq: Three times a day (TID) | ORAL | 3 refills | Status: AC

## 2018-05-03 NOTE — Progress Notes (Signed)
[place] OFFICE PROGRESS NOTE  Patient Care Team: Valerie Roys, DO as PCP - General (Family Medicine) Telford Nab, RN as Registered Nurse Rogue Bussing, Elisha Headland, MD as Medical Oncologist (Medical Oncology)  Cancer Staging No matching staging information was found for the patient.   Oncology History   # July 2019- [facility]- RUL [s/p Liver Bx]-CT chest- RUL/ liver/ brain solitary lesion.  to contralateral lung; pleura; liver and brain.July 2019- A/P- liver metastases at least 2; bone scan shows right humeral; left fifth rib; L2 vertebral body metastases.  # July 31st- carbo-tax-Atezo+Avastin s/p 4 cycles; OCT 2019- CT Stable/PR   # NOV 2019- Acute respiratory faliure sec to immunotherapy induced- NOV 2019- S/p infliximab x2; prednisone to 40 mg/day; 11/18 cellcept 500 mg BID.   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  --------------------------------------------------------------------   DIAGNOSIS: Adenocarcinoma lung  STAGE: 4    ;GOALS: Palliative  CURRENT/MOST RECENT THERAPY: Off therapy/surveillance      Primary cancer of right upper lobe of lung ([place])   11/24/2017 -  Chemotherapy    The patient had bevacizumab (AVASTIN) 1,000 mg in sodium chloride 0.9 % 100 mL chemo infusion, 15 mg/kg = 1,000 mg, Intravenous,  Once, 4 of 4 cycles Administration: 1,000 mg (12/16/2017), 1,000 mg (01/19/2018), 1,000 mg (02/09/2018), 1,000 mg (03/03/2018)  for chemotherapy treatment.     12/09/2017 -  Chemotherapy    The patient had palonosetron (ALOXI) injection 0.25 mg, 0.25 mg, Intravenous,  Once, 4 of 4 cycles Administration: 0.25 mg (12/16/2017), 0.25 mg (01/19/2018), 0.25 mg (02/09/2018), 0.25 mg (03/03/2018) CARBOplatin (PARAPLATIN) 670 mg in sodium chloride 0.9 % 250 mL chemo infusion, 670 mg (100 % of original dose 667.8 mg), Intravenous,  Once, 4 of 4 cycles Dose modification:   (original dose 667.8 mg, Cycle  1, Reason: Provider Judgment) Administration: 670 mg (12/16/2017), 610 mg (01/19/2018), 610 mg (02/09/2018) PACLitaxel (TAXOL) 360 mg in sodium chloride 0.9 % 500 mL chemo infusion (> 20m/m2), 200 mg/m2 = 360 mg, Intravenous,  Once, 4 of 4 cycles Administration: 360 mg (12/16/2017), 360 mg (01/19/2018), 360 mg (02/09/2018), 360 mg (03/03/2018) atezolizumab (TECENTRIQ) 1,200 mg in sodium chloride 0.9 % 250 mL chemo infusion, 1,200 mg, Intravenous, Once, 4 of 4 cycles Administration: 1,200 mg (12/16/2017), 1,200 mg (01/19/2018), 1,200 mg (02/09/2018), 1,200 mg (03/03/2018)  for chemotherapy treatment.     INTERVAL HISTORY:  James [id number].[id number].  male of a history of metastatic adenocarcinoma the lung currently status post cycle #4 carbotaxol Avastin plus Tecentriq is here for follow-up; patient continues to be off any treatment given pneumonitis acute respiratory failure from immunotherapy.  Patient continues to be on CellCept 5 mg twice a day and also prednisone 40 mg a day.  Patient continues to be on oxygen 2 L saturating about 94%.  However on room air he drops to mid 80s.  Continues to work with physical therapy/Occupational Therapy at home.  He is currently going around with a walker at home.  His appetite is good; gained about 5 pounds.  His anxiety is better controlled.  He continues to take Ativan; Valium as needed.  He is interested in a portable oxygen tank to help with mobility.  Review of Systems  Constitutional: Positive for malaise/fatigue and weight loss. Negative for chills, diaphoresis and fever.  HENT: Negative for nosebleeds and sore throat.   Eyes: Negative for double vision.  Respiratory: Positive for shortness of breath. Negative for  hemoptysis, sputum production and wheezing.   Cardiovascular: Negative for chest pain, palpitations, orthopnea and leg swelling.  Gastrointestinal: Negative for abdominal pain, blood in stool, constipation, diarrhea, heartburn, melena and vomiting.   Genitourinary: Negative for dysuria, frequency and urgency.  Musculoskeletal: Negative for back pain and joint pain.  Skin: Negative.  Negative for itching and rash.  Neurological: Negative for dizziness, tingling, focal weakness, weakness and headaches.  Endo/Heme/Allergies: Does not bruise/bleed easily.  Psychiatric/Behavioral: Negative for depression. The patient is nervous/anxious. The patient does not have insomnia.       PAST MEDICAL HISTORY :  Past Medical History:  Diagnosis Date  . Cancer ([place]) 12/01/2017   Primary cancer of right upper lobe of lung. Mets to liver, brain.  Marland Kitchen Prepatellar bursitis of left knee    patient denies    PAST SURGICAL HISTORY :   Past Surgical History:  Procedure Laterality Date  . collapsed lung  2012   "had to reinflate" " i carried large piece of sheet rock up stairs by myself "   . [place]   x2 , second was with mesh   . TOTAL HIP ARTHROPLASTY Right 04/09/2017   Procedure: RIGHT TOTAL HIP ARTHROPLASTY ANTERIOR APPROACH;  Surgeon: Mcarthur Rossetti, MD;  Location: [facility];  Service: Orthopedics;  Laterality: Right;  . URETHRAL STRICTURE DILATATION      FAMILY HISTORY :  History reviewed. No pertinent family history.  SOCIAL HISTORY:   Social History   Tobacco Use  . Smoking status: Former Smoker    Types: Cigarettes    Last attempt to quit: 03/18/2012    Years since quitting: 6.1  . Smokeless tobacco: Never Used  . Tobacco comment: 5 years   Substance Use Topics  . Alcohol use: Yes    Comment: Socially  . Drug use: Yes    Types: Marijuana    Comment: 1 month ago     ALLERGIES:  has No Known Allergies.  MEDICATIONS:  Current Outpatient Medications  Medication Sig Dispense Refill  . albuterol (PROAIR HFA) 108 (90 Base) MCG/ACT inhaler Inhale 1-2 puffs into the lungs every 6 (six) hours as needed for wheezing or shortness of breath. 1 Inhaler 5  . Ascorbic Acid (VITAMIN C) 1000 MG tablet Take 1,000 mg  by mouth daily.    . budesonide (PULMICORT) 0.25 MG/2ML nebulizer solution Take 2 mLs (0.25 mg total) by nebulization 2 (two) times daily. 60 mL 12  . diazepam (VALIUM) 5 MG tablet Take 1 tablet (5 mg total) by mouth at bedtime as needed for anxiety. 30 tablet 0  . LORazepam (ATIVAN) 1 MG tablet Take 1 tablet (1 mg total) by mouth at bedtime as needed for anxiety. 30 tablet 0  . metoprolol tartrate (LOPRESSOR) 25 MG tablet Take 0.5 tablets (12.5 mg total) by mouth 2 (two) times daily. 60 tablet 0  . Multiple Vitamin (MULTIVITAMIN) tablet Take 1 tablet by mouth daily.    . mycophenolate (CELLCEPT) 500 MG tablet Take 1 tablet (500 mg total) by mouth 2 (two) times daily. 60 tablet 0  . predniSONE (DELTASONE) 20 MG tablet Take 2 pills in the morning with food.;  Do not stop until directed. 90 tablet 0  . tiotropium (SPIRIVA HANDIHALER) 18 MCG inhalation capsule INHALE 1 CAPSULE VIA HANDIHALER ONCE DAILY AT THE SAME TIME EVERY DAY 30 capsule 3  . diltiazem (CARDIZEM) 30 MG tablet Take 1 tablet (30 mg total) by mouth every 8 (eight) hours. 90 tablet 3  .  guaiFENesin (MUCINEX) 600 MG 12 hr tablet Take 1 tablet (600 mg total) by mouth 2 (two) times daily. (Patient not taking: Reported on 05/03/2018)    . levalbuterol (XOPENEX) 1.25 MG/0.5ML nebulizer solution Take 0.63 mg by nebulization every 6 (six) hours as needed for wheezing or shortness of breath. 1 each 12  . ondansetron (ZOFRAN) 8 MG tablet One pill every 8 hours as needed for nausea/vomitting. (Patient not taking: Reported on 04/28/2018) 40 tablet 1  . prochlorperazine (COMPAZINE) 10 MG tablet Take 1 tablet (10 mg total) by mouth every 6 (six) hours as needed for nausea or vomiting. (Patient not taking: Reported on 04/28/2018) 40 tablet 1   No current facility-administered medications for this visit.     PHYSICAL EXAMINATION: ECOG PERFORMANCE STATUS: 1 - Symptomatic but completely ambulatory  BP 118/82   Pulse 99   Temp 99.2 F (37.3 C)  (Tympanic)   Resp 20   Ht '5\' 10"'  (1.778 m)   Wt 124 lb 8 oz (56.5 kg)   SpO2 94%   BMI 17.86 kg/m   Filed Weights   05/03/18 1004  Weight: 124 lb 8 oz (56.5 kg)    Physical Exam  Constitutional: He is oriented to person, place, and time.  Accompanied by his wife.  Cachectic appearing.  On nasal cannula oxygen.  He is in a wheelchair.  HENT:  Head: Normocephalic and atraumatic.  Mouth/Throat: Oropharynx is clear and moist. No oropharyngeal exudate.  Eyes: Pupils are equal, round, and reactive to light.  Neck: Normal range of motion. Neck supple.  Cardiovascular: Normal rate and regular rhythm.  Pulmonary/Chest: No respiratory distress. He has no wheezes.  Decreased air entry bilaterally.  Abdominal: Soft. Bowel sounds are normal. He exhibits no distension and no mass. There is no abdominal tenderness. There is no rebound and no guarding.  Musculoskeletal: Normal range of motion.        General: No tenderness or edema.  Neurological: He is alert and oriented to person, place, and time.  Skin: Skin is warm.  Psychiatric: Affect normal.  Appears upset.    LABORATORY DATA:  I have reviewed the data as listed    Component Value Date/Time   NA 137 05/03/2018 0944   NA 138 08/24/2017 1651   NA 137 04/23/2013 1330   K 3.6 05/03/2018 0944   K 3.8 04/23/2013 1330   CL 103 05/03/2018 0944   CL 105 04/23/2013 1330   CO2 26 05/03/2018 0944   CO2 22 04/23/2013 1330   GLUCOSE 158 (H) 05/03/2018 0944   GLUCOSE 120 (H) 04/23/2013 1330   BUN 26 (H) 05/03/2018 0944   BUN 18 08/24/2017 1651   BUN 18 04/23/2013 1330   CREATININE 0.66 05/03/2018 0944   CREATININE 0.90 04/23/2013 1330   CALCIUM 8.8 (L) 05/03/2018 0944   CALCIUM 9.3 04/23/2013 1330   PROT 6.1 (L) 05/03/2018 0944   PROT 6.8 08/24/2017 1651   PROT 7.5 04/23/2013 1330   ALBUMIN 3.2 (L) 05/03/2018 0944   ALBUMIN 4.2 08/24/2017 1651   ALBUMIN 3.8 04/23/2013 1330   AST 55 (H) 05/03/2018 0944   AST 11 (L) 04/23/2013  1330   ALT 120 (H) 05/03/2018 0944   ALT 18 04/23/2013 1330   ALKPHOS 106 05/03/2018 0944   ALKPHOS 64 04/23/2013 1330   BILITOT 0.7 05/03/2018 0944   BILITOT 0.2 08/24/2017 1651   BILITOT 0.6 04/23/2013 1330   GFRNONAA >60 05/03/2018 0944   GFRNONAA >60 04/23/2013 1330   GFRAA >60  05/03/2018 0944   GFRAA >60 04/23/2013 1330    No results found for: SPEP, UPEP  Lab Results  Component Value Date   WBC 10.9 (H) 04/26/2018   NEUTROABS 9.7 (H) 04/26/2018   HGB 12.4 (L) 04/26/2018   HCT 37.2 (L) 04/26/2018   MCV 96.9 04/26/2018   PLT 154 04/26/2018      Chemistry      Component Value Date/Time   NA 137 05/03/2018 0944   NA 138 08/24/2017 1651   NA 137 04/23/2013 1330   K 3.6 05/03/2018 0944   K 3.8 04/23/2013 1330   CL 103 05/03/2018 0944   CL 105 04/23/2013 1330   CO2 26 05/03/2018 0944   CO2 22 04/23/2013 1330   BUN 26 (H) 05/03/2018 0944   BUN 18 08/24/2017 1651   BUN 18 04/23/2013 1330   CREATININE 0.66 05/03/2018 0944   CREATININE 0.90 04/23/2013 1330      Component Value Date/Time   CALCIUM 8.8 (L) 05/03/2018 0944   CALCIUM 9.3 04/23/2013 1330   ALKPHOS 106 05/03/2018 0944   ALKPHOS 64 04/23/2013 1330   AST 55 (H) 05/03/2018 0944   AST 11 (L) 04/23/2013 1330   ALT 120 (H) 05/03/2018 0944   ALT 18 04/23/2013 1330   BILITOT 0.7 05/03/2018 0944   BILITOT 0.2 08/24/2017 1651   BILITOT 0.6 04/23/2013 1330       RADIOGRAPHIC STUDIES: I have personally reviewed the radiological images as listed and agreed with the findings in the report. Dg Chest 2 View  Result Date: 05/03/2018 CLINICAL DATA:  Right lung cancer. Pneumonitis from immunotherapy. Shortness of breath. EXAM: CHEST - 2 VIEW COMPARISON:  None. FINDINGS: Mediastinum and hilar structures normal. Heart size stable. Stable nodule right upper lung. Stable bilateral interstitial prominence with nodularity again noted consistent with known pneumonitis. COPD. Small right pleural effusion. No pneumothorax.  Heart size stable. IMPRESSION: 1.  Stable nodule right upper lung. 2. Stable bilateral interstitial prominence with nodularity again noted consistent with known pneumonitis. COPD. Small left pleural effusion. Electronically Signed   By: Marcello Moores  Register   On: 05/03/2018 11:30     ASSESSMENT & PLAN:  Primary cancer of right upper lobe of lung ([place]) #Lung adenocarcinoma-stage IV; S/p carbotaxol plus Avastin plus Tecentriq cycle # 4; CT scan NOV 2019-partial response/stable however unfortunately clinical course complicated by pneumonitis/immunotherapy mediated [see discussion below].   # continue to HOLD further systemic therapy given acute issues [see below].   # Acute respiratory faliure sec to immunotherapy induced/COPD-clinically stable. however; not back to baseline.  Continue prednisone to 40 mg/day; continue cellcept 500 mg BID.  Chest x-ray recently 1 week ago- STABLE; Await CXR from today. Needs portable oxygen; will check with insurance/home health.  # Bone mets- X-geva continue-  calcium vitamin D.  Stable  # Weight loss-continue prednisone. Improved.   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  Stable.   # Panic attacks/ anxiety/ insominia-sleeping better;  Continue ativan 0.5 mg BID prn.  Continue valium 5 mg qhs.  Improved.  #Mild elevation of LFTs normal bilirubin-question drug-induced. Continue cellcept.   # steroid myopathy/debility- continue physical therapy.  # DISPOSITION:  # get CXR today. # follow up in 1 week/labs-CMP- Dr.B       Orders Placed This Encounter  Procedures  . DG Chest 2 View    Standing Status:   Future    Number of Occurrences:  1    Standing Expiration Date:   07/03/2019    Order Specific Question:   Reason for Exam (SYMPTOM  OR DIAGNOSIS REQUIRED)    Answer:   pneumonitis from immunotherapy.    Order Specific Question:   Preferred imaging location?    Answer:    [facility]   All questions were answered. The patient knows to call the clinic with any problems, questions or concerns.      Cammie Sickle, MD 05/03/2018 12:29 PM

## 2018-05-03 NOTE — Telephone Encounter (Signed)
Spoke with patient's wife Lelon Frohlich and pt regarding chest xray results.  Also updated the pt and wife on the oxygen concentrator. Explained that [place] at [facility] stated that [facility] "does not carry [employer] products, those are only dispensed from the company [employer] directly." [facility] does "carry some other options that are comparable to the [employer] such as the Oxlife Freedom and Simply Go Mini. The products, including the [employer]ogen G5 only give a "pulse dose" or "on-demand" setting." patient is currently on 3L continuous which these portable concentrators would not provide currently. Pt gave verbal understanding.  pt and wife thanked me for updating them on the results and oxygen concentrator.

## 2018-05-03 NOTE — Progress Notes (Signed)
Patient has gained weight since last visit. Appetite noted to be improved. Patient's anxiety is improved and he is taking his ativan as directed.   Patient is requesting a inogen oxygen generators portable (model 5) tank. I msg Jason with [facility] to determine if this is a device that [facility] carries or if they have a similar device on hand.

## 2018-05-03 NOTE — Assessment & Plan Note (Addendum)
#  Lung adenocarcinoma-stage IV; S/p carbotaxol plus Avastin plus Tecentriq cycle # 4; CT scan NOV 2019-partial response/stable however unfortunately clinical course complicated by pneumonitis/immunotherapy mediated [see discussion below].   # continue to HOLD further systemic therapy given acute issues [see below].   # Acute respiratory faliure sec to immunotherapy induced/COPD-clinically stable. however; not back to baseline.  Continue prednisone to 40 mg/day; continue cellcept 500 mg BID.  Chest x-ray recently 1 week ago- STABLE; Await CXR from today. Needs portable oxygen; will check with insurance/home health.  # Bone mets- X-geva continue-  calcium vitamin D.  Stable  # Weight loss-continue prednisone. Improved.   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  Stable.   # Panic attacks/ anxiety/ insominia-sleeping better;  Continue ativan 0.5 mg BID prn.  Continue valium 5 mg qhs.  Improved.  #Mild elevation of LFTs normal bilirubin-question drug-induced. Continue cellcept.   # steroid myopathy/debility stable.  Continue physical therapy.  # DISPOSITION:  # get CXR today. # follow up in 1 week/labs-CMP- Dr.B

## 2018-05-03 NOTE — Telephone Encounter (Signed)
Heather/Brooke -please inform patient/wife that chest x-ray continues to improve slowly.  Continue current plan/follow-up.  No new recommendations.

## 2018-05-10 ENCOUNTER — Other Ambulatory Visit: Payer: Self-pay

## 2018-05-10 ENCOUNTER — Inpatient Hospital Stay (HOSPITAL_BASED_OUTPATIENT_CLINIC_OR_DEPARTMENT_OTHER): Payer: BC Managed Care – PPO | Admitting: Internal Medicine

## 2018-05-10 ENCOUNTER — Inpatient Hospital Stay: Payer: BC Managed Care – PPO

## 2018-05-10 ENCOUNTER — Other Ambulatory Visit: Payer: Self-pay | Admitting: *Deleted

## 2018-05-10 ENCOUNTER — Encounter: Payer: Self-pay | Admitting: Internal Medicine

## 2018-05-10 VITALS — BP 120/85 | HR 122 | Temp 96.8°F | Resp 20 | Ht 70.0 in | Wt 123.0 lb

## 2018-05-10 DIAGNOSIS — Z9221 Personal history of antineoplastic chemotherapy: Secondary | ICD-10-CM

## 2018-05-10 DIAGNOSIS — C3411 Malignant neoplasm of upper lobe, right bronchus or lung: Secondary | ICD-10-CM

## 2018-05-10 DIAGNOSIS — F410 Panic disorder [episodic paroxysmal anxiety] without agoraphobia: Secondary | ICD-10-CM

## 2018-05-10 DIAGNOSIS — Z79899 Other long term (current) drug therapy: Secondary | ICD-10-CM

## 2018-05-10 DIAGNOSIS — J702 Acute drug-induced interstitial lung disorders: Secondary | ICD-10-CM | POA: Diagnosis not present

## 2018-05-10 DIAGNOSIS — Z87891 Personal history of nicotine dependence: Secondary | ICD-10-CM

## 2018-05-10 DIAGNOSIS — C7951 Secondary malignant neoplasm of bone: Secondary | ICD-10-CM

## 2018-05-10 DIAGNOSIS — C7931 Secondary malignant neoplasm of brain: Secondary | ICD-10-CM | POA: Diagnosis not present

## 2018-05-10 DIAGNOSIS — R7989 Other specified abnormal findings of blood chemistry: Secondary | ICD-10-CM

## 2018-05-10 DIAGNOSIS — Z7952 Long term (current) use of systemic steroids: Secondary | ICD-10-CM

## 2018-05-10 DIAGNOSIS — R634 Abnormal weight loss: Secondary | ICD-10-CM

## 2018-05-10 DIAGNOSIS — G720 Drug-induced myopathy: Secondary | ICD-10-CM

## 2018-05-10 LAB — COMPREHENSIVE METABOLIC PANEL
ALT: 129 U/L — ABNORMAL HIGH (ref 0–44)
ANION GAP: 8 (ref 5–15)
AST: 47 U/L — ABNORMAL HIGH (ref 15–41)
Albumin: 3.3 g/dL — ABNORMAL LOW (ref 3.5–5.0)
Alkaline Phosphatase: 106 U/L (ref 38–126)
BUN: 26 mg/dL — ABNORMAL HIGH (ref 8–23)
CO2: 26 mmol/L (ref 22–32)
Calcium: 8.6 mg/dL — ABNORMAL LOW (ref 8.9–10.3)
Chloride: 105 mmol/L (ref 98–111)
Creatinine, Ser: 0.69 mg/dL (ref 0.61–1.24)
GFR calc Af Amer: >60 mL/min (ref >60)
GFR calc non Af Amer: >60 mL/min (ref >60)
Glucose, Bld: 116 mg/dL — ABNORMAL HIGH (ref 70–99)
Potassium: 3.8 mmol/L (ref 3.5–5.1)
Sodium: 139 mmol/L (ref 135–145)
Total Bilirubin: 0.7 mg/dL (ref 0.3–1.2)
Total Protein: 6.2 g/dL — ABNORMAL LOW (ref 6.5–8.1)

## 2018-05-10 NOTE — Progress Notes (Signed)
[name] OFFICE PROGRESS NOTE  Patient Care Team: Valerie Roys, DO as PCP - General (Family Medicine) Telford Nab, RN as Registered Nurse Rogue Bussing, Elisha Headland, MD as Medical Oncologist (Medical Oncology)  Cancer Staging No matching staging information was found for the patient.   Oncology History   # July 2019- [facility] CA- RUL [s/p Liver Bx]-CT chest- RUL/ liver/ brain solitary lesion.  to contralateral lung; pleura; liver and brain.July 2019- A/P- liver metastases at least 2; bone scan shows right humeral; left fifth rib; L2 vertebral body metastases.  # July 31st- carbo-tax-Atezo+Avastin s/p 4 cycles; OCT 2019- CT Stable/PR   # NOV 2019- Acute respiratory faliure sec to immunotherapy induced- NOV 2019- S/p infliximab x2; prednisone to 40 mg/day; 11/18 cellcept 500 mg BID.   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  --------------------------------------------------------------------   DIAGNOSIS: Adenocarcinoma lung  STAGE: 4    ;GOALS: Palliative  CURRENT/MOST RECENT THERAPY: Off therapy/surveillance      Primary cancer of right upper lobe of lung ([place])   11/24/2017 -  Chemotherapy    The patient had bevacizumab (AVASTIN) 1,000 mg in sodium chloride 0.9 % 100 mL chemo infusion, 15 mg/kg = 1,000 mg, Intravenous,  Once, 4 of 4 cycles Administration: 1,000 mg (12/16/2017), 1,000 mg (01/19/2018), 1,000 mg (02/09/2018), 1,000 mg (03/03/2018)  for chemotherapy treatment.     12/09/2017 -  Chemotherapy    The patient had palonosetron (ALOXI) injection 0.25 mg, 0.25 mg, Intravenous,  Once, 4 of 4 cycles Administration: 0.25 mg (12/16/2017), 0.25 mg (01/19/2018), 0.25 mg (02/09/2018), 0.25 mg (03/03/2018) CARBOplatin (PARAPLATIN) 670 mg in sodium chloride 0.9 % 250 mL chemo infusion, 670 mg (100 % of original dose 667.8 mg), Intravenous,  Once, 4 of 4 cycles Dose modification:   (original dose 667.8 mg, Cycle  1, Reason: Provider Judgment) Administration: 670 mg (12/16/2017), 610 mg (01/19/2018), 610 mg (02/09/2018) PACLitaxel (TAXOL) 360 mg in sodium chloride 0.9 % 500 mL chemo infusion (> 46m/m2), 200 mg/m2 = 360 mg, Intravenous,  Once, 4 of 4 cycles Administration: 360 mg (12/16/2017), 360 mg (01/19/2018), 360 mg (02/09/2018), 360 mg (03/03/2018) atezolizumab (TECENTRIQ) 1,200 mg in sodium chloride 0.9 % 250 mL chemo infusion, 1,200 mg, Intravenous, Once, 4 of 4 cycles Administration: 1,200 mg (12/16/2017), 1,200 mg (01/19/2018), 1,200 mg (02/09/2018), 1,200 mg (03/03/2018)  for chemotherapy treatment.     INTERVAL HISTORY:  James Cleavenger89y.o.  male of a history of metastatic adenocarcinoma the lung currently status post cycle #4 carbotaxol Avastin plus Tecentriq is here for follow-up; patient continues to be off any treatment given pneumonitis acute respiratory failure from immunotherapy.  Patient is currently on prednisone 40 mg a day; and also CellCept 500 mg twice a day.  Patient continues to be on 2 L of oxygen saturating high 90s at this time.  However with exertion oxygen saturations drops to low 80s.  He is currently status post physical therapy occupational therapy at home.  Obviously his mobility is restricted by his oxygen requirements fatigue levels.  Appetite is fair.  Review of Systems  Constitutional: Positive for malaise/fatigue and weight loss. Negative for chills, diaphoresis and fever.  HENT: Negative for nosebleeds and sore throat.   Eyes: Negative for double vision.  Respiratory: Positive for shortness of breath. Negative for hemoptysis, sputum production and wheezing.   Cardiovascular: Negative for chest pain, palpitations, orthopnea and leg swelling.  Gastrointestinal: Negative for abdominal pain, blood in stool, constipation,  diarrhea, heartburn, melena and vomiting.  Genitourinary: Negative for dysuria, frequency and urgency.  Musculoskeletal: Negative for back pain and joint pain.   Skin: Negative.  Negative for itching and rash.  Neurological: Negative for dizziness, tingling, focal weakness, weakness and headaches.  Endo/Heme/Allergies: Does not bruise/bleed easily.  Psychiatric/Behavioral: Negative for depression. The patient is nervous/anxious. The patient does not have insomnia.       PAST MEDICAL HISTORY :  Past Medical History:  Diagnosis Date  . Cancer ([place]) 12/01/2017   Primary cancer of right upper lobe of lung. Mets to liver, brain.  Marland Kitchen Prepatellar bursitis of left knee    patient denies    PAST SURGICAL HISTORY :   Past Surgical History:  Procedure Laterality Date  . collapsed lung  2012   "had to reinflate" " i carried large piece of sheet rock up stairs by myself "   . [place]   x2 , second was with mesh   . TOTAL HIP ARTHROPLASTY Right 04/09/2017   Procedure: RIGHT TOTAL HIP ARTHROPLASTY ANTERIOR APPROACH;  Surgeon: Mcarthur Rossetti, MD;  Location: [facility];  Service: Orthopedics;  Laterality: Right;  . URETHRAL STRICTURE DILATATION      FAMILY HISTORY :  History reviewed. No pertinent family history.  SOCIAL HISTORY:   Social History   Tobacco Use  . Smoking status: Former Smoker    Types: Cigarettes    Last attempt to quit: 03/18/2012    Years since quitting: 6.1  . Smokeless tobacco: Never Used  . Tobacco comment: 5 years   Substance Use Topics  . Alcohol use: Yes    Comment: Socially  . Drug use: Yes    Types: Marijuana    Comment: 1 month ago     ALLERGIES:  has No Known Allergies.  MEDICATIONS:  Current Outpatient Medications  Medication Sig Dispense Refill  . albuterol (PROAIR HFA) 108 (90 Base) MCG/ACT inhaler Inhale 1-2 puffs into the lungs every 6 (six) hours as needed for wheezing or shortness of breath. 1 Inhaler 5  . Ascorbic Acid (VITAMIN C) 1000 MG tablet Take 1,000 mg by mouth daily.    . budesonide (PULMICORT) 0.25 MG/2ML nebulizer solution Take 2 mLs (0.25 mg total) by nebulization  2 (two) times daily. 60 mL 12  . diazepam (VALIUM) 5 MG tablet Take 1 tablet (5 mg total) by mouth at bedtime as needed for anxiety. 30 tablet 0  . diltiazem (CARDIZEM) 30 MG tablet Take 1 tablet (30 mg total) by mouth every 8 (eight) hours. 90 tablet 3  . guaiFENesin (MUCINEX) 600 MG 12 hr tablet Take 1 tablet (600 mg total) by mouth 2 (two) times daily.    Marland Kitchen levalbuterol (XOPENEX) 1.25 MG/3ML nebulizer solution Take 0.63 mg by nebulization every 6 (six) hours as needed for wheezing or shortness of breath. 1 mL 3  . LORazepam (ATIVAN) 1 MG tablet Take 1 tablet (1 mg total) by mouth at bedtime as needed for anxiety. 30 tablet 0  . metoprolol tartrate (LOPRESSOR) 25 MG tablet Take 0.5 tablets (12.5 mg total) by mouth 2 (two) times daily. 60 tablet 0  . Multiple Vitamin (MULTIVITAMIN) tablet Take 1 tablet by mouth daily.    . mycophenolate (CELLCEPT) 500 MG tablet Take 1 tablet (500 mg total) by mouth 2 (two) times daily. 60 tablet 0  . ondansetron (ZOFRAN) 8 MG tablet One pill every 8 hours as needed for nausea/vomitting. 40 tablet 1  . predniSONE (DELTASONE) 20 MG  tablet Take 2 pills in the morning with food.;  Do not stop until directed. 90 tablet 0  . prochlorperazine (COMPAZINE) 10 MG tablet Take 1 tablet (10 mg total) by mouth every 6 (six) hours as needed for nausea or vomiting. 40 tablet 1  . tiotropium (SPIRIVA HANDIHALER) 18 MCG inhalation capsule INHALE 1 CAPSULE VIA HANDIHALER ONCE DAILY AT THE SAME TIME EVERY DAY 30 capsule 3   No current facility-administered medications for this visit.     PHYSICAL EXAMINATION: ECOG PERFORMANCE STATUS: 3 - Symptomatic, >50% confined to bed  BP 120/85   Pulse (!) 122   Temp (!) 96.8 F (36 C) (Tympanic)   Resp 20   Ht '5\' 10"'  (1.778 m)   Wt 123 lb 0.3 oz (55.8 kg)   SpO2 95%   BMI 17.65 kg/m   Filed Weights   05/10/18 0842  Weight: 123 lb 0.3 oz (55.8 kg)    Physical Exam  Constitutional: He is oriented to person, place, and time.   Accompanied by his wife.  Cachectic appearing.  On nasal cannula oxygen.  He is in a wheelchair.  HENT:  Head: Normocephalic and atraumatic.  Mouth/Throat: Oropharynx is clear and moist. No oropharyngeal exudate.  Eyes: Pupils are equal, round, and reactive to light.  Neck: Normal range of motion. Neck supple.  Cardiovascular: Normal rate and regular rhythm.  Pulmonary/Chest: No respiratory distress. He has no wheezes.  Decreased air entry bilaterally.  Abdominal: Soft. Bowel sounds are normal. He exhibits no distension and no mass. There is no abdominal tenderness. There is no rebound and no guarding.  Musculoskeletal: Normal range of motion.        General: No tenderness or edema.  Neurological: He is alert and oriented to person, place, and time.  Skin: Skin is warm.  Psychiatric: Affect normal.  Appears upset.    LABORATORY DATA:  I have reviewed the data as listed    Component Value Date/Time   NA 139 05/10/2018 0834   NA 138 08/24/2017 1651   NA 137 04/23/2013 1330   K 3.8 05/10/2018 0834   K 3.8 04/23/2013 1330   CL 105 05/10/2018 0834   CL 105 04/23/2013 1330   CO2 26 05/10/2018 0834   CO2 22 04/23/2013 1330   GLUCOSE 116 (H) 05/10/2018 0834   GLUCOSE 120 (H) 04/23/2013 1330   BUN 26 (H) 05/10/2018 0834   BUN 18 08/24/2017 1651   BUN 18 04/23/2013 1330   CREATININE 0.69 05/10/2018 0834   CREATININE 0.90 04/23/2013 1330   CALCIUM 8.6 (L) 05/10/2018 0834   CALCIUM 9.3 04/23/2013 1330   PROT 6.2 (L) 05/10/2018 0834   PROT 6.8 08/24/2017 1651   PROT 7.5 04/23/2013 1330   ALBUMIN 3.3 (L) 05/10/2018 0834   ALBUMIN 4.2 08/24/2017 1651   ALBUMIN 3.8 04/23/2013 1330   AST 47 (H) 05/10/2018 0834   AST 11 (L) 04/23/2013 1330   ALT 129 (H) 05/10/2018 0834   ALT 18 04/23/2013 1330   ALKPHOS 106 05/10/2018 0834   ALKPHOS 64 04/23/2013 1330   BILITOT 0.7 05/10/2018 0834   BILITOT 0.2 08/24/2017 1651   BILITOT 0.6 04/23/2013 1330   GFRNONAA >60 05/10/2018 0834    GFRNONAA >60 04/23/2013 1330   GFRAA >60 05/10/2018 0834   GFRAA >60 04/23/2013 1330    No results found for: SPEP, UPEP  Lab Results  Component Value Date   WBC 10.9 (H) 04/26/2018   NEUTROABS 9.7 (H) 04/26/2018   HGB 12.4 (  L) 04/26/2018   HCT 37.2 (L) 04/26/2018   MCV 96.9 04/26/2018   PLT 154 04/26/2018      Chemistry      Component Value Date/Time   NA 139 05/10/2018 0834   NA 138 08/24/2017 1651   NA 137 04/23/2013 1330   K 3.8 05/10/2018 0834   K 3.8 04/23/2013 1330   CL 105 05/10/2018 0834   CL 105 04/23/2013 1330   CO2 26 05/10/2018 0834   CO2 22 04/23/2013 1330   BUN 26 (H) 05/10/2018 0834   BUN 18 08/24/2017 1651   BUN 18 04/23/2013 1330   CREATININE 0.69 05/10/2018 0834   CREATININE 0.90 04/23/2013 1330      Component Value Date/Time   CALCIUM 8.6 (L) 05/10/2018 0834   CALCIUM 9.3 04/23/2013 1330   ALKPHOS 106 05/10/2018 0834   ALKPHOS 64 04/23/2013 1330   AST 47 (H) 05/10/2018 0834   AST 11 (L) 04/23/2013 1330   ALT 129 (H) 05/10/2018 0834   ALT 18 04/23/2013 1330   BILITOT 0.7 05/10/2018 0834   BILITOT 0.2 08/24/2017 1651   BILITOT 0.6 04/23/2013 1330       RADIOGRAPHIC STUDIES: I have personally reviewed the radiological images as listed and agreed with the findings in the report. No results found.   ASSESSMENT & PLAN:  Primary cancer of right upper lobe of lung ([place]) #Lung adenocarcinoma-stage IV; S/p carbotaxol plus Avastin plus Tecentriq cycle # 4; CT scan NOV 2019-partial response/stable however unfortunately clinical course complicated by pneumonitis/immunotherapy mediated [see discussion below].   #Continue holding further systemic therapy at this time; see discussion below.  Discussed regarding restaging CAT scans regarding reevaluation the next few weeks.  #Acute respiratory failure secondary immunotherapy-recent chest x-ray 1 week ago stable/improved.  Taper prednisone to 20 mg a day; increase CellCept 1000 mg in the morning and 500  mg in the evening.   # Bone mets- X-geva continue-  calcium vitamin D.  Stable  # Weight loss-continue prednisone.  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  Stable  # Panic attacks/ anxiety/ insominia-sleeping better;  Continue ativan 0.5 mg BID prn.  Continue valium 5 mg qhs.  Stable  #Mild elevation of LFTs normal bilirubin-question drug-induced. Continue cellcept.   # steroid myopathy/debility-status post physical therapy stable.  #Overall prognosis continues to be poor-given his ongoing respiratory failure the context of his stage IV lung cancer.  Patient feels he does not want to continue with his current quality of life.  Unfortunately continues to feel angry the whole situation.  He states that he does not want to be a burden on his family.  Also concerned about expenses/insurance issues since starting the year.  # DISPOSITION:  # recommend palliative care referral ASAP # follow up in 1 week/labs-LFTs/ CXR prior- Dr.B   # 40 minutes face-to-face with the patient discussing the above plan of care; more than 50% of time spent on prognosis/ natural history; counseling and coordination.   Orders Placed This Encounter  Procedures  . DG Chest 2 View    Standing Status:   Future    Standing Expiration Date:   07/10/2019    Order Specific Question:   Reason for Exam (SYMPTOM  OR DIAGNOSIS REQUIRED)    Answer:   Pneumonitis immunotherapy/lung cancer    Order Specific Question:   Preferred imaging location?    Answer:   [facility] [place]  .  Hepatic function panel    Standing Status:   Future    Standing Expiration Date:   05/10/2019   All questions were answered. The patient knows to call the clinic with any problems, questions or concerns.      Cammie Sickle, MD 05/10/2018 9:33 AM

## 2018-05-10 NOTE — Assessment & Plan Note (Addendum)
#  Lung adenocarcinoma-stage IV; S/p carbotaxol plus Avastin plus Tecentriq cycle # 4; CT scan NOV 2019-partial response/stable however unfortunately clinical course complicated by pneumonitis/immunotherapy mediated [see discussion below].   #Continue holding further systemic therapy at this time; see discussion below.  Discussed regarding restaging CAT scans regarding reevaluation the next few weeks.  #Acute respiratory failure secondary immunotherapy-recent chest x-ray 1 week ago stable/improved.  Taper prednisone to 20 mg a day; increase CellCept 1000 mg in the morning and 500 mg in the evening.   # Bone mets- X-geva continue-  calcium vitamin D.  Stable  # Weight loss-continue prednisone.  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  Stable  # Panic attacks/ anxiety/ insominia-sleeping better;  Continue ativan 0.5 mg BID prn.  Continue valium 5 mg qhs.  Stable  #Mild elevation of LFTs normal bilirubin-question drug-induced. Continue cellcept.   # steroid myopathy/debility-status post physical therapy stable.  #Overall prognosis continues to be poor-given his ongoing respiratory failure the context of his stage IV lung cancer.  Patient feels he does not want to continue with his current quality of life.  Unfortunately continues to feel angry the whole situation.  He states that he does not want to be a burden on his family.  Also concerned about expenses/insurance issues since starting the year.  # DISPOSITION:  # recommend palliative care referral ASAP # follow up in 1 week/labs-LFTs/ CXR prior- Dr.B   # 40 minutes face-to-face with the patient discussing the above plan of care; more than 50% of time spent on prognosis/ natural history; counseling and coordination.

## 2018-05-16 ENCOUNTER — Other Ambulatory Visit: Payer: Self-pay | Admitting: Internal Medicine

## 2018-05-16 NOTE — Telephone Encounter (Signed)
...   Ref Range & Units 2wk ago (04/26/18) 3wk ago (04/19/18) 67mo ago (04/08/18) 19mo ago (04/01/18) 13mo ago (03/29/18) 110mo ago (03/26/18) 34mo ago (03/25/18)  WBC 4.0 - 10.5 K/uL 10.9High   8.6  9.9  7.6  8.2  14.6High   7.2   RBC 4.22 - 5.81 MIL/uL 3.84Low   3.90Low   3.67Low   3.43Low   2.94Low   3.07Low   3.15Low    Hemoglobin 13.0 - 17.0 g/dL 12.4Low   12.8Low   12.0Low   11.3Low   9.6Low   10.0Low   10.4Low    HCT 39.0 - 52.0 % 37.2Low   38.2Low   35.1Low   34.3Low   30.0Low   30.1Low   31.5Low    MCV 80.0 - 100.0 fL 96.9  97.9  95.6  100.0  102.0High   98.0  100.0   MCH 26.0 - 34.0 pg 32.3  32.8  32.7  32.9  32.7  32.6  33.0   MCHC 30.0 - 36.0 g/dL 33.3  33.5  34.2  32.9  32.0  33.2  33.0   RDW 11.5 - 15.5 % 14.5  14.9  14.4  14.6  14.3  13.8  13.7   Platelets 150 - 400 K/uL 154  175  181  228  214  242  182   nRBC 0.0 - 0.2 % 0.0  0.0  0.0  0.0 CM 0.0 CM 0.0 CM 0.0 CM  Neutrophils Relative % % 89  69  76       Neutro Abs 1.7 - 7.7 K/uL 9.7High   6.0  7.5       Lymphocytes Relative % 5  22  14        Lymphs Abs 0.7 - 4.0 K/uL 0.6Low   1.9  1.4       Monocytes Relative % 5  6  8        Monocytes Absolute 0.1 - 1.0 K/uL 0.5  0.5  0.8       Eosinophils Relative % 0  2  1       Eosinophils Absolute 0.0 - 0.5 K/uL 0.0  0.1  0.1       Basophils Relative % 0  0  0       Basophils Absolute 0.0 - 0.1 K/uL 0.0  0.0  0.0       Immature Granulocytes % 1  1  1        Abs Immature Granulocytes 0.00 - 0.07 K/uL 0.10High   0.04 CM 0.07 CM      Comment: Performed at [facility], [address] [place], [place] [address]  Resulting Agency  [facility] [place] [facility] [place] [facility] [place] [facility] [place] [facility] [place] [facility] [place] [facility]      Specimen Collected: 04/26/18 09:51  Last Resulted: 04/26/18 10:03         ...  (important suggestion)  Newer results are available. Click to view them now.   Ref Range & Units 2wk ago (04/26/18) 3wk ago (04/19/18) 83mo ago (04/08/18) 64mo ago (03/31/18)  26mo ago (03/30/18) 61mo ago (03/28/18) 61mo ago (03/26/18)  Sodium 135 - 145 mmol/L 137  136  135   137   136   Potassium 3.5 - 5.1 mmol/L 4.1  3.8  3.6   4.5   4.3   Chloride 98 - 111 mmol/L 103  102  105   106   109  CO2 22 - 32 mmol/L 25  25  23   22    20Low    Glucose, Bld 70 - 99 mg/dL 113High   137High   95   129High    146High    BUN 8 - 23 mg/dL 24High   24High   20   23   20    Creatinine, Ser 0.61 - 1.24 mg/dL 0.63  0.69  0.66  0.63  0.62  0.64  0.68   Calcium 8.9 - 10.3 mg/dL 8.7Low   8.7Low   8.8Low    7.8Low    7.8Low    Total Protein 6.5 - 8.1 g/dL 6.0Low   6.0Low   6.0Low        Albumin 3.5 - 5.0 g/dL 3.3Low   3.2Low   3.1Low        AST 15 - 41 U/L 48High   52High   44High        ALT 0 - 44 U/L 103High   119High   80High        Alkaline Phosphatase 38 - 126 U/L 101  104  93       Total Bilirubin 0.3 - 1.2 mg/dL 1.0  0.6  0.6       GFR calc non Af Amer >60 mL/min >60  >60  >60  >60  >60  >60  >60   GFR calc Af Amer >60 mL/min >60  >60  >60 CM >60 CM >60 CM >60 CM >60 CM  Anion gap 5 - 15 9  9  CM 7 CM  9 CM  7 CM  Comment: Performed at [facility], [address] [place], [place] [address]  Resulting Agency  [facility] [place] [facility] [place] [facility] [place] [facility] [place] [facility] [place] [facility] [place] [facility]      Specimen Collected: 04/26/18 09:51  Last Resulted: 04/26/18 10:16

## 2018-05-17 ENCOUNTER — Ambulatory Visit
Admission: RE | Admit: 2018-05-17 | Discharge: 2018-05-17 | Disposition: A | Discharge status: Home / Self Care | Payer: BC Managed Care – PPO | Source: Ambulatory Visit | Attending: Internal Medicine | Admitting: Internal Medicine

## 2018-05-17 ENCOUNTER — Inpatient Hospital Stay: Payer: BC Managed Care – PPO

## 2018-05-17 ENCOUNTER — Inpatient Hospital Stay (HOSPITAL_BASED_OUTPATIENT_CLINIC_OR_DEPARTMENT_OTHER): Payer: BC Managed Care – PPO | Admitting: Internal Medicine

## 2018-05-17 ENCOUNTER — Encounter: Payer: Self-pay | Admitting: Internal Medicine

## 2018-05-17 ENCOUNTER — Ambulatory Visit
Admission: RE | Admit: 2018-05-17 | Discharge: 2018-05-17 | Disposition: A | Discharge status: Home / Self Care | Payer: BC Managed Care – PPO | Attending: Internal Medicine | Admitting: Internal Medicine

## 2018-05-17 VITALS — BP 101/72 | HR 108 | Temp 97.8°F | Resp 16 | Wt 126.5 lb

## 2018-05-17 DIAGNOSIS — R7989 Other specified abnormal findings of blood chemistry: Secondary | ICD-10-CM

## 2018-05-17 DIAGNOSIS — C7951 Secondary malignant neoplasm of bone: Secondary | ICD-10-CM | POA: Diagnosis not present

## 2018-05-17 DIAGNOSIS — C3411 Malignant neoplasm of upper lobe, right bronchus or lung: Secondary | ICD-10-CM

## 2018-05-17 DIAGNOSIS — Z7952 Long term (current) use of systemic steroids: Secondary | ICD-10-CM

## 2018-05-17 DIAGNOSIS — C7931 Secondary malignant neoplasm of brain: Secondary | ICD-10-CM | POA: Diagnosis not present

## 2018-05-17 DIAGNOSIS — Z79899 Other long term (current) drug therapy: Secondary | ICD-10-CM

## 2018-05-17 DIAGNOSIS — R634 Abnormal weight loss: Secondary | ICD-10-CM

## 2018-05-17 DIAGNOSIS — Z9221 Personal history of antineoplastic chemotherapy: Secondary | ICD-10-CM

## 2018-05-17 DIAGNOSIS — J702 Acute drug-induced interstitial lung disorders: Secondary | ICD-10-CM

## 2018-05-17 DIAGNOSIS — F410 Panic disorder [episodic paroxysmal anxiety] without agoraphobia: Secondary | ICD-10-CM

## 2018-05-17 DIAGNOSIS — Z87891 Personal history of nicotine dependence: Secondary | ICD-10-CM

## 2018-05-17 DIAGNOSIS — G720 Drug-induced myopathy: Secondary | ICD-10-CM

## 2018-05-17 LAB — HEPATIC FUNCTION PANEL
ALT: 114 U/L — ABNORMAL HIGH (ref 0–44)
AST: 56 U/L — AB (ref 15–41)
Albumin: 3.3 g/dL — ABNORMAL LOW (ref 3.5–5.0)
Alkaline Phosphatase: 114 U/L (ref 38–126)
Total Bilirubin: 0.9 mg/dL (ref 0.3–1.2)
Total Protein: 6.2 g/dL — ABNORMAL LOW (ref 6.5–8.1)

## 2018-05-17 MED ORDER — MYCOPHENOLATE MOFETIL 500 MG PO TABS: 1000.0000 mg | ORAL_TABLET | Freq: Two times a day (BID) | ORAL | 0 refills | Status: DC

## 2018-05-17 NOTE — Progress Notes (Signed)
[name] OFFICE PROGRESS NOTE  Patient Care Team: Valerie Roys, DO as PCP - General (Family Medicine) Telford Nab, RN as Registered Nurse Rogue Bussing, Elisha Headland, MD as Medical Oncologist (Medical Oncology)  Cancer Staging No matching staging information was found for the patient.   Oncology History   # July 2019- [facility] CA- RUL [s/p Liver Bx]-CT chest- RUL/ liver/ brain solitary lesion.  to contralateral lung; pleura; liver and brain.July 2019- A/P- liver metastases at least 2; bone scan shows right humeral; left fifth rib; L2 vertebral body metastases.  # July 31st- carbo-tax-Atezo+Avastin s/p 4 cycles; OCT 2019- CT Stable/PR   # NOV 2019- Acute respiratory faliure sec to immunotherapy induced- NOV 2019- S/p infliximab x2; prednisone to 40 mg/day; 11/18 cellcept 500 mg BID.   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  --------------------------------------------------------------------   DIAGNOSIS: Adenocarcinoma lung  STAGE: 4    ;GOALS: Palliative  CURRENT/MOST RECENT THERAPY: Off therapy/surveillance      Primary cancer of right upper lobe of lung ([place])   11/24/2017 -  Chemotherapy    The patient had bevacizumab (AVASTIN) 1,000 mg in sodium chloride 0.9 % 100 mL chemo infusion, 15 mg/kg = 1,000 mg, Intravenous,  Once, 4 of 4 cycles Administration: 1,000 mg (12/16/2017), 1,000 mg (01/19/2018), 1,000 mg (02/09/2018), 1,000 mg (03/03/2018)  for chemotherapy treatment.     12/09/2017 -  Chemotherapy    The patient had palonosetron (ALOXI) injection 0.25 mg, 0.25 mg, Intravenous,  Once, 4 of 4 cycles Administration: 0.25 mg (12/16/2017), 0.25 mg (01/19/2018), 0.25 mg (02/09/2018), 0.25 mg (03/03/2018) CARBOplatin (PARAPLATIN) 670 mg in sodium chloride 0.9 % 250 mL chemo infusion, 670 mg (100 % of original dose 667.8 mg), Intravenous,  Once, 4 of 4 cycles Dose modification:   (original dose 667.8 mg, Cycle  1, Reason: Provider Judgment) Administration: 670 mg (12/16/2017), 610 mg (01/19/2018), 610 mg (02/09/2018) PACLitaxel (TAXOL) 360 mg in sodium chloride 0.9 % 500 mL chemo infusion (> 68m/m2), 200 mg/m2 = 360 mg, Intravenous,  Once, 4 of 4 cycles Administration: 360 mg (12/16/2017), 360 mg (01/19/2018), 360 mg (02/09/2018), 360 mg (03/03/2018) atezolizumab (TECENTRIQ) 1,200 mg in sodium chloride 0.9 % 250 mL chemo infusion, 1,200 mg, Intravenous, Once, 4 of 4 cycles Administration: 1,200 mg (12/16/2017), 1,200 mg (01/19/2018), 1,200 mg (02/09/2018), 1,200 mg (03/03/2018)  for chemotherapy treatment.     INTERVAL HISTORY:  James Godek89y.o.  male of a history of metastatic adenocarcinoma the lung currently status post cycle #4 carbotaxol Avastin plus Tecentriq is here for follow-up; patient continues to be off any treatment given pneumonitis acute respiratory failure from immunotherapy.  Patient is currently on prednisone 20 mg a day.  CellCept 1500 mg a day.  He continues to be on 2 L of oxygen while resting.  However with exertion saturations dropped to low 80s as per family.  And the oxygen levels to go up so soon after resting.   Had episode of cough yesterday currently improved.  No significant sputum production.  Continues to be upset with his limitations/quality of life given the overall illness.  Review of Systems  Constitutional: Positive for malaise/fatigue and weight loss. Negative for chills, diaphoresis and fever.  HENT: Negative for nosebleeds and sore throat.   Eyes: Negative for double vision.  Respiratory: Positive for shortness of breath. Negative for hemoptysis, sputum production and wheezing.   Cardiovascular: Negative for chest pain, palpitations, orthopnea and leg swelling.  Gastrointestinal:  Negative for abdominal pain, blood in stool, constipation, diarrhea, heartburn, melena and vomiting.  Genitourinary: Negative for dysuria, frequency and urgency.  Musculoskeletal: Negative for  back pain and joint pain.  Skin: Negative.  Negative for itching and rash.  Neurological: Negative for dizziness, tingling, focal weakness, weakness and headaches.  Endo/Heme/Allergies: Does not bruise/bleed easily.  Psychiatric/Behavioral: Negative for depression. The patient is nervous/anxious. The patient does not have insomnia.       PAST MEDICAL HISTORY :  Past Medical History:  Diagnosis Date  . Cancer ([place]) 12/01/2017   Primary cancer of right upper lobe of lung. Mets to liver, brain.  Marland Kitchen Prepatellar bursitis of left knee    patient denies    PAST SURGICAL HISTORY :   Past Surgical History:  Procedure Laterality Date  . collapsed lung  2012   "had to reinflate" " i carried large piece of sheet rock up stairs by myself "   . [place]   x2 , second was with mesh   . TOTAL HIP ARTHROPLASTY Right 04/09/2017   Procedure: RIGHT TOTAL HIP ARTHROPLASTY ANTERIOR APPROACH;  Surgeon: Mcarthur Rossetti, MD;  Location: [facility];  Service: Orthopedics;  Laterality: Right;  . URETHRAL STRICTURE DILATATION      FAMILY HISTORY :  History reviewed. No pertinent family history.  SOCIAL HISTORY:   Social History   Tobacco Use  . Smoking status: Former Smoker    Types: Cigarettes    Last attempt to quit: 03/18/2012    Years since quitting: 6.1  . Smokeless tobacco: Never Used  . Tobacco comment: 5 years   Substance Use Topics  . Alcohol use: Yes    Comment: Socially  . Drug use: Yes    Types: Marijuana    Comment: 1 month ago     ALLERGIES:  has No Known Allergies.  MEDICATIONS:  Current Outpatient Medications  Medication Sig Dispense Refill  . albuterol (PROAIR HFA) 108 (90 Base) MCG/ACT inhaler Inhale 1-2 puffs into the lungs every 6 (six) hours as needed for wheezing or shortness of breath. 1 Inhaler 5  . Ascorbic Acid (VITAMIN C) 1000 MG tablet Take 1,000 mg by mouth daily.    . budesonide (PULMICORT) 0.25 MG/2ML nebulizer solution Take 2 mLs (0.25  mg total) by nebulization 2 (two) times daily. 60 mL 12  . diazepam (VALIUM) 5 MG tablet Take 1 tablet (5 mg total) by mouth at bedtime as needed for anxiety. 30 tablet 0  . diltiazem (CARDIZEM) 30 MG tablet Take 1 tablet (30 mg total) by mouth every 8 (eight) hours. 90 tablet 3  . guaiFENesin (MUCINEX) 600 MG 12 hr tablet Take 1 tablet (600 mg total) by mouth 2 (two) times daily.    Marland Kitchen levalbuterol (XOPENEX) 1.25 MG/3ML nebulizer solution Take 0.63 mg by nebulization every 6 (six) hours as needed for wheezing or shortness of breath. 1 mL 3  . LORazepam (ATIVAN) 1 MG tablet Take 1 tablet (1 mg total) by mouth at bedtime as needed for anxiety. 30 tablet 0  . metoprolol tartrate (LOPRESSOR) 25 MG tablet Take 0.5 tablets (12.5 mg total) by mouth 2 (two) times daily. 60 tablet 0  . Multiple Vitamin (MULTIVITAMIN) tablet Take 1 tablet by mouth daily.    . mycophenolate (CELLCEPT) 500 MG tablet Take 2 tablets (1,000 mg total) by mouth 2 (two) times daily. 120 tablet 0  . ondansetron (ZOFRAN) 8 MG tablet One pill every 8 hours as needed for nausea/vomitting. [place]  tablet 1  . predniSONE (DELTASONE) 20 MG tablet Take 2 pills in the morning with food.;  Do not stop until directed. 90 tablet 0  . prochlorperazine (COMPAZINE) 10 MG tablet Take 1 tablet (10 mg total) by mouth every 6 (six) hours as needed for nausea or vomiting. 40 tablet 1  . tiotropium (SPIRIVA HANDIHALER) 18 MCG inhalation capsule INHALE 1 CAPSULE VIA HANDIHALER ONCE DAILY AT THE SAME TIME EVERY DAY (Patient not taking: Reported on 05/17/2018) 30 capsule 3   No current facility-administered medications for this visit.     PHYSICAL EXAMINATION: ECOG PERFORMANCE STATUS: 3 - Symptomatic, >50% confined to bed  BP 101/72 (BP Location: Right Arm, Patient Position: Sitting, Cuff Size: Normal)   Pulse (!) 108   Temp 97.8 F (36.6 C) (Tympanic)   Resp 16   Wt 126 lb 8.7 oz (57.4 kg)   SpO2 93%   BMI 18.16 kg/m   Filed Weights   05/17/18  1413  Weight: 126 lb 8.7 oz (57.4 kg)    Physical Exam  Constitutional: He is oriented to person, place, and time.  Accompanied by his wife.  Cachectic appearing.  On nasal cannula oxygen.  He is in a wheelchair.  HENT:  Head: Normocephalic and atraumatic.  Mouth/Throat: Oropharynx is clear and moist. No oropharyngeal exudate.  Eyes: Pupils are equal, round, and reactive to light.  Neck: Normal range of motion. Neck supple.  Cardiovascular: Normal rate and regular rhythm.  Pulmonary/Chest: No respiratory distress. He has no wheezes.  Decreased air entry bilaterally.  Abdominal: Soft. Bowel sounds are normal. He exhibits no distension and no mass. There is no abdominal tenderness. There is no rebound and no guarding.  Musculoskeletal: Normal range of motion.        General: No tenderness or edema.  Neurological: He is alert and oriented to person, place, and time.  Skin: Skin is warm.  Psychiatric: Affect normal.  Appears upset.    LABORATORY DATA:  I have reviewed the data as listed    Component Value Date/Time   NA 139 05/10/2018 0834   NA 138 08/24/2017 1651   NA 137 04/23/2013 1330   K 3.8 05/10/2018 0834   K 3.8 04/23/2013 1330   CL 105 05/10/2018 0834   CL 105 04/23/2013 1330   CO2 26 05/10/2018 0834   CO2 22 04/23/2013 1330   GLUCOSE 116 (H) 05/10/2018 0834   GLUCOSE 120 (H) 04/23/2013 1330   BUN 26 (H) 05/10/2018 0834   BUN 18 08/24/2017 1651   BUN 18 04/23/2013 1330   CREATININE 0.69 05/10/2018 0834   CREATININE 0.90 04/23/2013 1330   CALCIUM 8.6 (L) 05/10/2018 0834   CALCIUM 9.3 04/23/2013 1330   PROT 6.2 (L) 05/17/2018 1326   PROT 6.8 08/24/2017 1651   PROT 7.5 04/23/2013 1330   ALBUMIN 3.3 (L) 05/17/2018 1326   ALBUMIN 4.2 08/24/2017 1651   ALBUMIN 3.8 04/23/2013 1330   AST 56 (H) 05/17/2018 1326   AST 11 (L) 04/23/2013 1330   ALT 114 (H) 05/17/2018 1326   ALT 18 04/23/2013 1330   ALKPHOS 114 05/17/2018 1326   ALKPHOS 64 04/23/2013 1330   BILITOT  0.9 05/17/2018 1326   BILITOT 0.2 08/24/2017 1651   BILITOT 0.6 04/23/2013 1330   GFRNONAA >60 05/10/2018 0834   GFRNONAA >60 04/23/2013 1330   GFRAA >60 05/10/2018 0834   GFRAA >60 04/23/2013 1330    No results found for: SPEP, UPEP  Lab Results  Component Value Date  WBC 10.9 (H) 04/26/2018   NEUTROABS 9.7 (H) 04/26/2018   HGB 12.4 (L) 04/26/2018   HCT 37.2 (L) 04/26/2018   MCV 96.9 04/26/2018   PLT 154 04/26/2018      Chemistry      Component Value Date/Time   NA 139 05/10/2018 0834   NA 138 08/24/2017 1651   NA 137 04/23/2013 1330   K 3.8 05/10/2018 0834   K 3.8 04/23/2013 1330   CL 105 05/10/2018 0834   CL 105 04/23/2013 1330   CO2 26 05/10/2018 0834   CO2 22 04/23/2013 1330   BUN 26 (H) 05/10/2018 0834   BUN 18 08/24/2017 1651   BUN 18 04/23/2013 1330   CREATININE 0.69 05/10/2018 0834   CREATININE 0.90 04/23/2013 1330      Component Value Date/Time   CALCIUM 8.6 (L) 05/10/2018 0834   CALCIUM 9.3 04/23/2013 1330   ALKPHOS 114 05/17/2018 1326   ALKPHOS 64 04/23/2013 1330   AST 56 (H) 05/17/2018 1326   AST 11 (L) 04/23/2013 1330   ALT 114 (H) 05/17/2018 1326   ALT 18 04/23/2013 1330   BILITOT 0.9 05/17/2018 1326   BILITOT 0.2 08/24/2017 1651   BILITOT 0.6 04/23/2013 1330       RADIOGRAPHIC STUDIES: I have personally reviewed the radiological images as listed and agreed with the findings in the report. Dg Chest 2 View  Result Date: 05/17/2018 CLINICAL DATA:  History of lung malignancy. Follow-up pneumonitis. Patient is being treated with immunotherapy. EXAM: CHEST - 2 VIEW COMPARISON:  PA and lateral chest x-ray of May 03, 2018 FINDINGS: The lungs are well-expanded. The interstitial markings are coarse and slightly more conspicuous today. This is most notable in the right infrahilar region and in the left perihilar and suprahilar regions. There is stable nodularity projecting over the anterolateral aspect of the right third rib. The heart and  pulmonary vascularity are normal. The mediastinum is normal in width. There is no pleural effusion. IMPRESSION: Interval increase in the interstitial markings of both lungs which may reflect known pneumonitis or less likely multifocal pneumonia. Correlation with clinical and laboratory values will be needed. Electronically Signed   By: David  Martinique M.D.   On: 05/17/2018 13:55     ASSESSMENT & PLAN:  Primary cancer of right upper lobe of lung ([place]) #Lung adenocarcinoma-stage IV; S/p carbotaxol plus Avastin plus Tecentriq cycle # 4; CT scan NOV 2019-partial response/stable however unfortunately clinical course complicated by pneumonitis/immunotherapy mediated [see discussion below].   #Continue holding further systemic therapy at this time; see discussion below.  Recommend repeating CT scan of the chest and pelvis to assess the pneumonitis and also status of the disease.  #Acute respiratory failure secondary immunotherapy-recent chest x-ray from today shows no significant improvement will be slightly worsening.  Await above CT scans.  Taper prednisone to 20 mg a day; increase CellCept 1000 milligrams twice a day.  New prescription sent.  # Bone mets- X-geva continue-  calcium vitamin D.  Stable  # Weight loss-continue prednisone.  Slightly improved.  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  Stable  # Panic attacks/ anxiety/ insominia-sleeping better;  Continue ativan 0.5 mg BID prn.  Continue valium 5 mg qhs.  Stable  #Mild elevation of LFTs normal bilirubin-again suspect immunotherapy induced.  Plan as above  # steroid myopathy/debility-status post physical therapy stable  #Again reviewed the overall serious prognosis given the fact patient has  not had a significant improvement in his respiratory status over the last few weeks.  Also discussed it quite possible that he might end up needing oxygen for his life.   Patient is interested in hospice referral; although he does not want to completely discontinue the current plan of care.  He and his wife understand the limitations from hospice standpoint if he continues current mode of care.  He and his wife reluctant with frequent visits/chest x-rays.  # DISPOSITION:  # hospice referral.  # follow up in 3 weeks-MD- cbc/cmp- CT C/A/P-prior- Dr.B    Orders Placed This Encounter  Procedures  . CT CHEST W CONTRAST    Standing Status:   Future    Standing Expiration Date:   05/18/2019    Order Specific Question:   If indicated for the ordered procedure, I authorize the administration of contrast media per Radiology protocol    Answer:   Yes    Order Specific Question:   Preferred imaging location?    Answer:   [facility]    Order Specific Question:   Radiology Contrast Protocol - do NOT remove file path    Answer:   [email or web address]    Order Specific Question:   ** REASON FOR EXAM (FREE TEXT)    Answer:   lung cancer/pneumonitis from immunotherapy  . CT ABDOMEN PELVIS W CONTRAST    Standing Status:   Future    Standing Expiration Date:   05/18/2019    Order Specific Question:   If indicated for the ordered procedure, I authorize the administration of contrast media per Radiology protocol    Answer:   Yes    Order Specific Question:   Preferred imaging location?    Answer:   [facility]    Order Specific Question:   Radiology Contrast Protocol - do NOT remove file path    Answer:   [email or web address]    Order Specific Question:   ** REASON FOR EXAM (FREE TEXT)    Answer:   lung cancer/pneumonitis/hepatitis from immunotherapy  . CBC with Differential/Platelet    Standing Status:   Future    Standing Expiration Date:   05/18/2019  . Comprehensive metabolic panel    Standing Status:   Future    Standing Expiration Date:   05/18/2019   All questions were answered. The patient knows to  call the clinic with any problems, questions or concerns.      Cammie Sickle, MD 05/19/2018 8:38 AM

## 2018-05-17 NOTE — Assessment & Plan Note (Addendum)
#  Lung adenocarcinoma-stage IV; S/p carbotaxol plus Avastin plus Tecentriq cycle # 4; CT scan NOV 2019-partial response/stable however unfortunately clinical course complicated by pneumonitis/immunotherapy mediated [see discussion below].   #Continue holding further systemic therapy at this time; see discussion below.  Recommend repeating CT scan of the chest and pelvis to assess the pneumonitis and also status of the disease.  #Acute respiratory failure secondary immunotherapy-recent chest x-ray from today shows no significant improvement will be slightly worsening.  Await above CT scans.  Taper prednisone to 20 mg a day; increase CellCept 1000 milligrams twice a day.  New prescription sent.  # Bone mets- X-geva continue-  calcium vitamin D.  Stable  # Weight loss-continue prednisone.  Slightly improved.  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  Stable  # Panic attacks/ anxiety/ insominia-sleeping better;  Continue ativan 0.5 mg BID prn.  Continue valium 5 mg qhs.  Stable  #Mild elevation of LFTs normal bilirubin-again suspect immunotherapy induced.  Plan as above  # steroid myopathy/debility-status post physical therapy stable  #Again reviewed the overall serious prognosis given the fact patient has not had a significant improvement in his respiratory status over the last few weeks.  Also discussed it quite possible that he might end up needing oxygen for his life.  Patient is interested in hospice referral; although he does not want to completely discontinue the current plan of care.  He and his wife understand the limitations from hospice standpoint if he continues current mode of care.  He and his wife reluctant with frequent visits/chest x-rays.  # DISPOSITION:  # hospice referral.  # follow up in 3 weeks-MD- cbc/cmp- CT C/A/P-prior- Dr.B

## 2018-05-20 ENCOUNTER — Other Ambulatory Visit: Payer: Self-pay | Admitting: Internal Medicine

## 2018-05-24 ENCOUNTER — Telehealth: Payer: Self-pay | Admitting: *Deleted

## 2018-05-24 NOTE — Telephone Encounter (Signed)
Called to report that patient refused hospice services at this time

## 2018-05-27 ENCOUNTER — Other Ambulatory Visit: Payer: Self-pay | Admitting: Internal Medicine

## 2018-05-31 ENCOUNTER — Ambulatory Visit
Admission: RE | Admit: 2018-05-31 | Discharge: 2018-05-31 | Disposition: A | Discharge status: Home / Self Care | Payer: BC Managed Care – PPO | Source: Ambulatory Visit | Attending: Internal Medicine | Admitting: Internal Medicine

## 2018-05-31 DIAGNOSIS — C3411 Malignant neoplasm of upper lobe, right bronchus or lung: Secondary | ICD-10-CM | POA: Diagnosis not present

## 2018-05-31 MED ORDER — IOHEXOL 300 MG/ML  SOLN
100.0000 mL | Freq: Once | INTRAMUSCULAR | Status: AC | PRN
  Administered 2018-05-31: 100 mL via INTRAVENOUS

## 2018-06-06 ENCOUNTER — Other Ambulatory Visit: Payer: Self-pay | Admitting: Internal Medicine

## 2018-06-06 NOTE — Telephone Encounter (Signed)
...   Ref Range & Units 2wk ago (05/17/18) 3wk ago (05/10/18) 63mo ago (05/03/18) 35mo ago (04/26/18) 54mo ago (04/19/18) 60mo ago (04/08/18) 67mo ago (03/24/18)  Total Protein 6.5 - 8.1 g/dL 6.2Low   6.2Low   6.1Low   6.0Low   6.0Low   6.0Low   7.2   Albumin 3.5 - 5.0 g/dL 3.3Low   3.3Low   3.2Low   3.3Low   3.2Low   3.1Low   3.0Low    AST 15 - 41 U/L 56High   47High   55High   48High   52High   44High   42High    ALT 0 - 44 U/L 114High   129High   120High   103High   119High   80High   43   Alkaline Phosphatase 38 - 126 U/L 114  106  106  101  104  93  63   Total Bilirubin 0.3 - 1.2 mg/dL 0.9  0.7  0.7  1.0  0.6  0.6  0.8   Bilirubin, Direct 0.0 - 0.2 mg/dL <0.1         Indirect Bilirubin 0.3 - 0.9 mg/dL NOT CALCULATED         Comment: Performed at [facility], [address], [place], [place] [address]  Resulting Agency  [facility] CLIN LAB [place] CLIN LAB [place] CL[facility] LAB [place] [facility] LAB [place] [facility] LAB [place] [facility] [place] [facility]      Specimen Collected: 05/17/18 13:26  Last Resulted: 05/17/18 13:55       ...  (important suggestion)  Newer results are available. Click to view them now.   Ref Range & Units 3wk ago  Sodium 135 - 145 mmol/L 139   Potassium 3.5 - 5.1 mmol/L 3.8   Chloride 98 - 111 mmol/L 105   CO2 22 - 32 mmol/L 26   Glucose, Bld 70 - 99 mg/dL 116High    BUN 8 - 23 mg/dL 26High    Creatinine, Ser 0.61 - 1.24 mg/dL 0.69   Calcium 8.9 - 10.3 mg/dL 8.6Low    Total Protein 6.5 - 8.1 g/dL 6.2Low    Albumin 3.5 - 5.0 g/dL 3.3Low    AST 15 - 41 U/L 47High    ALT 0 - 44 U/L 129High    Alkaline Phosphatase 38 - 126 U/L 106   Total Bilirubin 0.3 - 1.2 mg/dL 0.7   GFR calc non Af Amer >60 mL/min >60   GFR calc Af Amer >60 mL/min >60   Anion gap 5 - 15 8   Comment: Performed at [facility], [address], [place], [place] [address]  Resulting Agency  [facility] CLIN LAB      Specimen Collected: 05/10/18 08:34  Last Resulted: 05/10/18 08:54       ...   Ref Range & Units  95mo ago  WBC 4.0 - 10.5 K/uL 10.9High    RBC 4.22 - 5.81 MIL/uL 3.84Low    Hemoglobin 13.0 - 17.0 g/dL 12.4Low    HCT 39.0 - 52.0 % 37.2Low    MCV 80.0 - 100.0 fL 96.9   MCH 26.0 - 34.0 pg 32.3   MCHC 30.0 - 36.0 g/dL 33.3   RDW 11.5 - 15.5 % 14.5   Platelets 150 - 400 K/uL 154   nRBC 0.0 - 0.2 % 0.0   Neutrophils Relative % % 89   Neutro Abs 1.7 - 7.7 K/uL 9.7High    Lymphocytes Relative % 5  Lymphs Abs 0.7 - 4.0 K/uL 0.6Low    Monocytes Relative % 5   Monocytes Absolute 0.1 - 1.0 K/uL 0.5   Eosinophils Relative % 0   Eosinophils Absolute 0.0 - 0.5 K/uL 0.0   Basophils Relative % 0   Basophils Absolute 0.0 - 0.1 K/uL 0.0   Immature Granulocytes % 1   Abs Immature Granulocytes 0.00 - 0.07 K/uL 0.10High    Comment: Performed at [facility], [address] [place], [place] [address]

## 2018-06-07 ENCOUNTER — Inpatient Hospital Stay: Payer: BC Managed Care – PPO | Attending: Internal Medicine | Admitting: Internal Medicine

## 2018-06-07 ENCOUNTER — Other Ambulatory Visit: Payer: Self-pay

## 2018-06-07 ENCOUNTER — Inpatient Hospital Stay: Payer: BC Managed Care – PPO

## 2018-06-07 VITALS — BP 108/83 | HR 99 | Temp 97.0°F | Resp 18 | Ht 70.0 in | Wt 126.5 lb

## 2018-06-07 DIAGNOSIS — C3411 Malignant neoplasm of upper lobe, right bronchus or lung: Secondary | ICD-10-CM | POA: Diagnosis present

## 2018-06-07 DIAGNOSIS — C7931 Secondary malignant neoplasm of brain: Secondary | ICD-10-CM | POA: Insufficient documentation

## 2018-06-07 DIAGNOSIS — Z87891 Personal history of nicotine dependence: Secondary | ICD-10-CM | POA: Insufficient documentation

## 2018-06-07 DIAGNOSIS — I10 Essential (primary) hypertension: Secondary | ICD-10-CM | POA: Diagnosis not present

## 2018-06-07 DIAGNOSIS — Z9221 Personal history of antineoplastic chemotherapy: Secondary | ICD-10-CM | POA: Insufficient documentation

## 2018-06-07 DIAGNOSIS — J9600 Acute respiratory failure, unspecified whether with hypoxia or hypercapnia: Secondary | ICD-10-CM | POA: Diagnosis not present

## 2018-06-07 DIAGNOSIS — C787 Secondary malignant neoplasm of liver and intrahepatic bile duct: Secondary | ICD-10-CM | POA: Insufficient documentation

## 2018-06-07 DIAGNOSIS — R634 Abnormal weight loss: Secondary | ICD-10-CM | POA: Diagnosis not present

## 2018-06-07 DIAGNOSIS — C7951 Secondary malignant neoplasm of bone: Secondary | ICD-10-CM | POA: Insufficient documentation

## 2018-06-07 DIAGNOSIS — Z79899 Other long term (current) drug therapy: Secondary | ICD-10-CM | POA: Insufficient documentation

## 2018-06-07 LAB — CBC WITH DIFFERENTIAL/PLATELET
ABS IMMATURE GRANULOCYTES: 0.17 10*3/uL — AB (ref 0.00–0.07)
Basophils Absolute: 0 10*3/uL (ref 0.0–0.1)
Basophils Relative: 0 %
Eosinophils Absolute: 0.1 10*3/uL (ref 0.0–0.5)
Eosinophils Relative: 1 %
HCT: 35.6 % — ABNORMAL LOW (ref 39.0–52.0)
Hemoglobin: 12.3 g/dL — ABNORMAL LOW (ref 13.0–17.0)
Immature Granulocytes: 2 %
Lymphocytes Relative: 9 %
Lymphs Abs: 1 10*3/uL (ref 0.7–4.0)
MCH: 33.3 pg (ref 26.0–34.0)
MCHC: 34.6 g/dL (ref 30.0–36.0)
MCV: 96.5 fL (ref 80.0–100.0)
Monocytes Absolute: 0.8 10*3/uL (ref 0.1–1.0)
Monocytes Relative: 7 %
NRBC: 0 % (ref 0.0–0.2)
Neutro Abs: 9.3 10*3/uL — ABNORMAL HIGH (ref 1.7–7.7)
Neutrophils Relative %: 81 %
Platelets: 219 10*3/uL (ref 150–400)
RBC: 3.69 MIL/uL — ABNORMAL LOW (ref 4.22–5.81)
RDW: 14.3 % (ref 11.5–15.5)
WBC: 11.4 10*3/uL — ABNORMAL HIGH (ref 4.0–10.5)

## 2018-06-07 LAB — COMPREHENSIVE METABOLIC PANEL
ALT: 121 U/L — ABNORMAL HIGH (ref 0–44)
AST: 71 U/L — ABNORMAL HIGH (ref 15–41)
Albumin: 3.3 g/dL — ABNORMAL LOW (ref 3.5–5.0)
Alkaline Phosphatase: 140 U/L — ABNORMAL HIGH (ref 38–126)
Anion gap: 13 (ref 5–15)
BILIRUBIN TOTAL: 0.7 mg/dL (ref 0.3–1.2)
BUN: 24 mg/dL — ABNORMAL HIGH (ref 8–23)
CO2: 24 mmol/L (ref 22–32)
Calcium: 9.1 mg/dL (ref 8.9–10.3)
Chloride: 101 mmol/L (ref 98–111)
Creatinine, Ser: 0.81 mg/dL (ref 0.61–1.24)
GFR calc non Af Amer: >60 mL/min (ref >60)
Glucose, Bld: 110 mg/dL — ABNORMAL HIGH (ref 70–99)
Potassium: 4 mmol/L (ref 3.5–5.1)
Sodium: 138 mmol/L (ref 135–145)
Total Protein: 6.3 g/dL — ABNORMAL LOW (ref 6.5–8.1)

## 2018-06-07 NOTE — Assessment & Plan Note (Addendum)
#  Lung adenocarcinoma-stage IV; S/p carbotaxol plus Avastin plus Tecentriq cycle # 4-last treatment September/November 2019.  Therapy has been on hold because of location of acute pneumonitis from immunotherapy.  #CT scan-chest and pelvis January 2020-slight improvement of pneumonitis compared to the CT scan in November 2019.  However development of right lower lobe consolidation/also multiple subcentimeter liver lesions noted; slight increase in sclerotic left femoral lesion-again concerning for OVERALL WORSENING of LUNG CANCER.   #Given the concern for progression/significant decline in performance status/patient reluctant for any further therapeutic options-hospice would be reasonable.   #Acute respiratory failure secondary immunotherapy-slight improvement noted however concern for progression of disease.  Patient not too keen on continuing CellCept which I think is reasonable to stop.  Continue prednisone 20 mg a day.  # Bone mets- X-geva continue-  calcium vitamin D.  Stable  # Weight loss-continue prednisone.  Slightly improved.  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  Stable  # Panic attacks/ anxiety/ insominia-sleeping better;  Continue ativan 0.5 mg BID prn.  Continue valium 5 mg qhs.  Stable.  #Mild elevation of LFTs normal bilirubin-again suspect immunotherapy induced.  Plan as above  # steroid myopathy/debility-status post physical therapy; stable; no significant improvement.    #Hypertension-stable.  Okay to stop diltiazem continue metoprolol given his reluctance to take medications.  #Overall prognosis continues to be very poor.  I would recommend reevaluation with hospice.  Patient/wife had concerns about financial issues with hospice.  I have tried to reach [facility].   # 40 minutes face-to-face with the patient discussing the above plan of care; more than 50% of time spent on prognosis/ natural  history; counseling and coordination.   # DISPOSITION: # follow up in 3 weeks-MD/cbc/cmp- [place]-Dr.B

## 2018-06-07 NOTE — Progress Notes (Signed)
[place] OFFICE PROGRESS NOTE  Patient Care Team: Valerie Roys, DO as PCP - General (Family Medicine) Telford Nab, RN as Registered Nurse Rogue Bussing, Elisha Headland, MD as Medical Oncologist (Medical Oncology)  Cancer Staging No matching staging information was found for the patient.   Oncology History   # July 2019- [facility] CA- RUL [s/p Liver Bx]-CT chest- RUL/ liver/ brain solitary lesion.  to contralateral lung; pleura; liver and brain.July 2019- A/P- liver metastases at least 2; bone scan shows right humeral; left fifth rib; L2 vertebral body metastases.  # July 31st- carbo-tax-Atezo+Avastin s/p 4 cycles; OCT 2019- CT Stable/PR   # NOV 2019- Acute respiratory faliure sec to immunotherapy induced- NOV 2019- S/p infliximab x2; prednisone to 40 mg/day; 11/18 cellcept 500 mg BID.   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  --------------------------------------------------------  DIAGNOSIS: Adenocarcinoma lung  STAGE: 4    ;GOALS: Palliative  CURRENT/MOST RECENT THERAPY: Off therapy/surveillance      Primary cancer of right upper lobe of lung ([place])   11/24/2017 -  Chemotherapy    The patient had bevacizumab (AVASTIN) 1,000 mg in sodium chloride 0.9 % 100 mL chemo infusion, 15 mg/kg = 1,000 mg, Intravenous,  Once, 4 of 4 cycles Administration: 1,000 mg (12/16/2017), 1,000 mg (01/19/2018), 1,000 mg (02/09/2018), 1,000 mg (03/03/2018)  for chemotherapy treatment.     12/09/2017 -  Chemotherapy    The patient had palonosetron (ALOXI) injection 0.25 mg, 0.25 mg, Intravenous,  Once, 4 of 4 cycles Administration: 0.25 mg (12/16/2017), 0.25 mg (01/19/2018), 0.25 mg (02/09/2018), 0.25 mg (03/03/2018) CARBOplatin (PARAPLATIN) 670 mg in sodium chloride 0.9 % 250 mL chemo infusion, 670 mg (100 % of original dose 667.8 mg), Intravenous,  Once, 4 of 4 cycles Dose modification:   (original dose 667.8 mg, Cycle 1, Reason:  Provider Judgment) Administration: 670 mg (12/16/2017), 610 mg (01/19/2018), 610 mg (02/09/2018) PACLitaxel (TAXOL) 360 mg in sodium chloride 0.9 % 500 mL chemo infusion (> 39m/m2), 200 mg/m2 = 360 mg, Intravenous,  Once, 4 of 4 cycles Administration: 360 mg (12/16/2017), 360 mg (01/19/2018), 360 mg (02/09/2018), 360 mg (03/03/2018) atezolizumab (TECENTRIQ) 1,200 mg in sodium chloride 0.9 % 250 mL chemo infusion, 1,200 mg, Intravenous, Once, 4 of 4 cycles Administration: 1,200 mg (12/16/2017), 1,200 mg (01/19/2018), 1,200 mg (02/09/2018), 1,200 mg (03/03/2018)  for chemotherapy treatment.     INTERVAL HISTORY:  James [id number].  male of a history of metastatic adenocarcinoma the lung currently status post cycle #4 carbotaxol Avastin plus Tecentriq is here for follow-up/review the results of his CT scan chest abdomen pelvis.  Patient last chemo immunotherapy was in November 2019.  Patient's treatments are on hold because of acute pneumonitis from immunotherapy.  Continues on prednisone 20 mg a day and CellCept 1500 g a day.  Patient continues to feel poorly.  He continues to complain of pain in his right chest area radiating down his leg.  This happens after episode of significant cough.  He continues need oxygen about 2 L while resting.  He needs to go up on oxygen levels with exertion.  Patient is very limited with ambulation.  Also complains of posterior occipital headaches.  Especially after coughing.  Patient elected to take narcotic pain medication.  Is currently taking NSAIDs.  In the interim the also met with hospice; however currently not enrolled because of financial concerns.   Review of Systems  Constitutional: Positive for malaise/fatigue and weight loss.  Negative for chills, diaphoresis and fever.  HENT: Negative for nosebleeds and sore throat.   Eyes: Negative for double vision.  Respiratory: Positive for cough and shortness of breath. Negative for hemoptysis, sputum production and  wheezing.   Cardiovascular: Negative for chest pain, palpitations, orthopnea and leg swelling.  Gastrointestinal: Positive for nausea. Negative for abdominal pain, blood in stool, constipation, diarrhea, heartburn, melena and vomiting.  Genitourinary: Negative for dysuria, frequency and urgency.  Musculoskeletal: Negative for back pain and joint pain.  Skin: Negative.  Negative for itching and rash.  Neurological: Positive for weakness. Negative for dizziness, tingling, focal weakness and headaches.  Endo/Heme/Allergies: Does not bruise/bleed easily.  Psychiatric/Behavioral: Negative for depression. The patient is nervous/anxious. The patient does not have insomnia.       PAST MEDICAL HISTORY :  Past Medical History:  Diagnosis Date  . Cancer ([place]) 12/01/2017   Primary cancer of right upper lobe of lung. Mets to liver, brain.  Marland Kitchen Prepatellar bursitis of left knee    patient denies    PAST SURGICAL HISTORY :   Past Surgical History:  Procedure Laterality Date  . collapsed lung  2012   "had to reinflate" " i carried large piece of sheet rock up stairs by myself "   . [place]   x2 , second was with mesh   . TOTAL HIP ARTHROPLASTY Right 04/09/2017   Procedure: RIGHT TOTAL HIP ARTHROPLASTY ANTERIOR APPROACH;  Surgeon: Mcarthur Rossetti, MD;  Location: [facility];  Service: Orthopedics;  Laterality: Right;  . URETHRAL STRICTURE DILATATION      FAMILY HISTORY :  No family history on file.  SOCIAL HISTORY:   Social History   Tobacco Use  . Smoking status: Former Smoker    Types: Cigarettes    Last attempt to quit: 03/18/2012    Years since quitting: 6.2  . Smokeless tobacco: Never Used  . Tobacco comment: 5 years   Substance Use Topics  . Alcohol use: Yes    Comment: Socially  . Drug use: Yes    Types: Marijuana    Comment: 1 month ago     ALLERGIES:  has No Known Allergies.  MEDICATIONS:  Current Outpatient Medications  Medication Sig Dispense  Refill  . albuterol (PROAIR HFA) 108 (90 Base) MCG/ACT inhaler Inhale 1-2 puffs into the lungs every 6 (six) hours as needed for wheezing or shortness of breath. 1 Inhaler 5  . Ascorbic Acid (VITAMIN C) 1000 MG tablet Take 1,000 mg by mouth daily.    . budesonide (PULMICORT) 0.25 MG/2ML nebulizer solution Take 2 mLs (0.25 mg total) by nebulization 2 (two) times daily. 60 mL 12  . diazepam (VALIUM) 5 MG tablet Take 1 tablet (5 mg total) by mouth at bedtime as needed for anxiety. 30 tablet 0  . diltiazem (CARDIZEM) 30 MG tablet Take 1 tablet (30 mg total) by mouth every 8 (eight) hours. 90 tablet 3  . guaiFENesin (MUCINEX) 600 MG 12 hr tablet Take 1 tablet (600 mg total) by mouth 2 (two) times daily.    Marland Kitchen levalbuterol (XOPENEX) 1.25 MG/3ML nebulizer solution Take 0.63 mg by nebulization every 6 (six) hours as needed for wheezing or shortness of breath. 1 mL 3  . LORazepam (ATIVAN) 1 MG tablet TAKE 1 TABLET (1 MG TOTAL) BY MOUTH AT BEDTIME AS NEEDED FOR ANXIETY. 30 tablet 0  . metoprolol tartrate (LOPRESSOR) 25 MG tablet Take 0.5 tablets (12.5 mg total) by mouth 2 (two) times daily. [place]  tablet 0  . Multiple Vitamin (MULTIVITAMIN) tablet Take 1 tablet by mouth daily.    . mycophenolate (CELLCEPT) 500 MG tablet TAKE 2 TABLETS BY MOUTH TWICE DAILY 120 tablet 0  . ondansetron (ZOFRAN) 8 MG tablet One pill every 8 hours as needed for nausea/vomitting. 40 tablet 1  . predniSONE (DELTASONE) 10 MG tablet TAKE 4 TABS IN THE MORNING WITH FOOD. DO NOT STOP UNTIL DIRECTED. 180 tablet 3  . prochlorperazine (COMPAZINE) 10 MG tablet Take 1 tablet (10 mg total) by mouth every 6 (six) hours as needed for nausea or vomiting. 40 tablet 1  . tiotropium (SPIRIVA HANDIHALER) 18 MCG inhalation capsule INHALE 1 CAPSULE VIA HANDIHALER ONCE DAILY AT THE SAME TIME EVERY DAY (Patient not taking: Reported on 05/17/2018) 30 capsule 3   No current facility-administered medications for this visit.     PHYSICAL EXAMINATION: ECOG  PERFORMANCE STATUS: 3 - Symptomatic, >50% confined to bed  BP 108/83   Pulse 99   Temp (!) 97 F (36.1 C) (Tympanic)   Resp 18   Ht '5\' 10"'  (1.778 m)   Wt 126 lb 8.7 oz (57.4 kg)   SpO2 93% Comment: 2L  BMI 18.16 kg/m   Filed Weights   06/07/18 1406  Weight: 126 lb 8.7 oz (57.4 kg)    Physical Exam  Constitutional: He is oriented to person, place, and time.  Accompanied by his wife.  Cachectic appearing.  On nasal cannula oxygen.  He is in a wheelchair.  HENT:  Head: Normocephalic and atraumatic.  Mouth/Throat: Oropharynx is clear and moist. No oropharyngeal exudate.  Eyes: Pupils are equal, round, and reactive to light.  Neck: Normal range of motion. Neck supple.  Cardiovascular: Normal rate and regular rhythm.  Pulmonary/Chest: No respiratory distress. He has no wheezes.  Decreased air entry bilaterally.  Abdominal: Soft. Bowel sounds are normal. He exhibits no distension and no mass. There is no abdominal tenderness. There is no rebound and no guarding.  Musculoskeletal: Normal range of motion.        General: No tenderness or edema.  Neurological: He is alert and oriented to person, place, and time.  Skin: Skin is warm.  Psychiatric: Affect normal.  Appears upset.    LABORATORY DATA:  I have reviewed the data as listed    Component Value Date/Time   NA 138 06/07/2018 1329   NA 138 08/24/2017 1651   NA 137 04/23/2013 1330   K 4.0 06/07/2018 1329   K 3.8 04/23/2013 1330   CL 101 06/07/2018 1329   CL 105 04/23/2013 1330   CO2 24 06/07/2018 1329   CO2 22 04/23/2013 1330   GLUCOSE 110 (H) 06/07/2018 1329   GLUCOSE 120 (H) 04/23/2013 1330   BUN 24 (H) 06/07/2018 1329   BUN 18 08/24/2017 1651   BUN 18 04/23/2013 1330   CREATININE 0.81 06/07/2018 1329   CREATININE 0.90 04/23/2013 1330   CALCIUM 9.1 06/07/2018 1329   CALCIUM 9.3 04/23/2013 1330   PROT 6.3 (L) 06/07/2018 1329   PROT 6.8 08/24/2017 1651   PROT 7.5 04/23/2013 1330   ALBUMIN 3.3 (L) 06/07/2018  1329   ALBUMIN 4.2 08/24/2017 1651   ALBUMIN 3.8 04/23/2013 1330   AST 71 (H) 06/07/2018 1329   AST 11 (L) 04/23/2013 1330   ALT 121 (H) 06/07/2018 1329   ALT 18 04/23/2013 1330   ALKPHOS 140 (H) 06/07/2018 1329   ALKPHOS 64 04/23/2013 1330   BILITOT 0.7 06/07/2018 1329   BILITOT 0.2 08/24/2017  1651   BILITOT 0.6 04/23/2013 1330   GFRNONAA >60 06/07/2018 1329   GFRNONAA >60 04/23/2013 1330   GFRAA >60 06/07/2018 1329   GFRAA >60 04/23/2013 1330    No results found for: SPEP, UPEP  Lab Results  Component Value Date   WBC 11.4 (H) 06/07/2018   NEUTROABS 9.3 (H) 06/07/2018   HGB 12.3 (L) 06/07/2018   HCT 35.6 (L) 06/07/2018   MCV 96.5 06/07/2018   PLT 219 06/07/2018      Chemistry      Component Value Date/Time   NA 138 06/07/2018 1329   NA 138 08/24/2017 1651   NA 137 04/23/2013 1330   K 4.0 06/07/2018 1329   K 3.8 04/23/2013 1330   CL 101 06/07/2018 1329   CL 105 04/23/2013 1330   CO2 24 06/07/2018 1329   CO2 22 04/23/2013 1330   BUN 24 (H) 06/07/2018 1329   BUN 18 08/24/2017 1651   BUN 18 04/23/2013 1330   CREATININE 0.81 06/07/2018 1329   CREATININE 0.90 04/23/2013 1330      Component Value Date/Time   CALCIUM 9.1 06/07/2018 1329   CALCIUM 9.3 04/23/2013 1330   ALKPHOS 140 (H) 06/07/2018 1329   ALKPHOS 64 04/23/2013 1330   AST 71 (H) 06/07/2018 1329   AST 11 (L) 04/23/2013 1330   ALT 121 (H) 06/07/2018 1329   ALT 18 04/23/2013 1330   BILITOT 0.7 06/07/2018 1329   BILITOT 0.2 08/24/2017 1651   BILITOT 0.6 04/23/2013 1330       RADIOGRAPHIC STUDIES: I have personally reviewed the radiological images as listed and agreed with the findings in the report. No results found.   ASSESSMENT & PLAN:  Primary cancer of right upper lobe of lung ([place]) #Lung adenocarcinoma-stage IV; S/p carbotaxol plus Avastin plus Tecentriq cycle # 4-last treatment September/November 2019.  Therapy has been on hold because of location of acute pneumonitis from  immunotherapy.  #CT scan-chest and pelvis January 2020-slight improvement of pneumonitis compared to the CT scan in November 2019.  However development of right lower lobe consolidation/also multiple subcentimeter liver lesions noted; slight increase in sclerotic left femoral lesion-again concerning for OVERALL WORSENING of LUNG CANCER.   #Given the concern for progression/significant decline in performance status/patient reluctant for any further therapeutic options-hospice would be reasonable.   #Acute respiratory failure secondary immunotherapy-slight improvement noted however concern for progression of disease.  Patient not too keen on continuing CellCept which I think is reasonable to stop.  Continue prednisone 20 mg a day.  # Bone mets- X-geva continue-  calcium vitamin D.  Stable  # Weight loss-continue prednisone.  Slightly improved.  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  Stable  # Panic attacks/ anxiety/ insominia-sleeping better;  Continue ativan 0.5 mg BID prn.  Continue valium 5 mg qhs.  Stable.  #Mild elevation of LFTs normal bilirubin-again suspect immunotherapy induced.  Plan as above  # steroid myopathy/debility-status post physical therapy; stable; no significant improvement.    #Hypertension-stable.  Okay to stop diltiazem continue metoprolol given his reluctance to take medications.  #Overall prognosis continues to be very poor.  I would recommend reevaluation with hospice.  Patient/wife had concerns about financial issues with hospice.  I have tried to reach [facility].   # DISPOSITION: # follow up in 3 weeks-MD/cbc/cmp- [place]-Dr.B      No orders of the defined types were placed in this encounter.  All  questions were answered. The patient knows to call the clinic with any problems, questions or concerns.      Cammie Sickle, MD 06/09/2018 10:35 AM

## 2018-06-09 ENCOUNTER — Telehealth: Payer: Self-pay | Admitting: *Deleted

## 2018-06-09 NOTE — Telephone Encounter (Signed)
Updated referral has been faxed.

## 2018-06-09 NOTE — Telephone Encounter (Signed)
Wife has called them asking for hospice to come back out to assess for services, Hospice needs an updated referral order sent over

## 2018-06-13 ENCOUNTER — Telehealth: Payer: Self-pay | Admitting: *Deleted

## 2018-06-13 ENCOUNTER — Other Ambulatory Visit: Payer: Self-pay | Admitting: Nurse Practitioner

## 2018-06-13 MED ORDER — IPRATROPIUM-ALBUTEROL 0.5-2.5 (3) MG/3ML IN SOLN: 3.0000 mL | Freq: Four times a day (QID) | RESPIRATORY_TRACT | 0 refills | Status: AC | PRN

## 2018-06-13 MED ORDER — MORPHINE SULFATE (CONCENTRATE) 10 MG /0.5 ML PO SOLN: 10.0000 mg | ORAL | 0 refills | Status: DC | PRN

## 2018-06-13 NOTE — Telephone Encounter (Signed)
Hospice nurse called asking to stop the Albuterol and Xopenex and start patient on Duo neb Also asking for MS for shortness of breath Please advise

## 2018-06-14 NOTE — Telephone Encounter (Signed)
No, Looks like Lauren took care of it this morning. Thank you

## 2018-06-14 NOTE — Telephone Encounter (Signed)
Sure that sounds reasonable. Do you need me to call in a prescription?   Faythe Casa, NP 06/14/2018 9:30 AM

## 2018-06-21 ENCOUNTER — Other Ambulatory Visit: Payer: Self-pay | Admitting: Internal Medicine

## 2018-06-21 ENCOUNTER — Telehealth: Payer: Self-pay | Admitting: *Deleted

## 2018-06-21 NOTE — Progress Notes (Signed)
x

## 2018-06-21 NOTE — Telephone Encounter (Signed)
Sure,[name]  palliative care with hospice can check on pt. I think that is a great idea.  GB

## 2018-06-21 NOTE — Telephone Encounter (Signed)
Hospice nurse Buchanan called reporting patient is having panic attacks and that the Ativan and Valium are not helping. Asking if there is something else we can order for him. Also requesting increase in MS as the patient is having increased shortness of breath. Please advise

## 2018-06-21 NOTE — Telephone Encounter (Signed)
James [name]- please check if pt is using MS short acting; lauren had sent script few days ago. If using MS short acting not helping dyspnea- I would recommend pt seeing Josh to help with symptoms control.  Thanks GB

## 2018-06-21 NOTE — Telephone Encounter (Signed)
Patient is not consistently using MS, he is using every 4 hours as needed sometnmes every 2 hours. He doe snot want to be "knocked out all day" I explained that increasing dose will not help if he does not take it. She states her biggest concern is the panic attacks he is having and that is movement and not being able to breath. She reports he would not even got out of bed yesterday to use the bathroom for fear of not being able to breathe. Not sure he would be able to come into office to see Merrily Pew, would you want Palliative with Hospice to see him?

## 2018-06-21 NOTE — Telephone Encounter (Signed)
Order called for [facility]

## 2018-06-22 ENCOUNTER — Other Ambulatory Visit: Payer: Self-pay | Admitting: *Deleted

## 2018-06-22 MED ORDER — MORPHINE SULFATE (CONCENTRATE) 20 MG/ML PO SOLN: 10.0000 mg | ORAL | 0 refills | Status: AC | PRN

## 2018-06-25 ENCOUNTER — Telehealth: Payer: Self-pay | Admitting: Oncology

## 2018-06-25 NOTE — Telephone Encounter (Signed)
Hospice nurse Stacy called and reported that patient is feeling very shortness of breath. Currently on concentrated morphine liquid 20 mg/ml 0.9ml every 2 hours.  Per Marzetta Board this is not alleviating patient's symptoms. Okay to increase to 0.28ml every  hour Also very anxious on Xanax 0.25 every 4 hours as needed.  Okay to increase to 0.5 mg every 4 hours as needed. Advise RN if shortness of breath symptoms do not controlled on current regimen, okay to increase to oral morphine 0.5 ml every 30 minutes.  If still not controlled recommend patient to be transferred to hospice house for IV morphine.

## 2018-06-27 NOTE — Telephone Encounter (Signed)
Zhou- Thank you for taking care of this pt's anxiety issues.i agree with your recommendations. Difficult situation.  GB

## 2018-06-28 ENCOUNTER — Inpatient Hospital Stay: Payer: BC Managed Care – PPO

## 2018-06-28 ENCOUNTER — Encounter: Payer: Self-pay | Admitting: Internal Medicine

## 2018-06-28 ENCOUNTER — Inpatient Hospital Stay: Payer: BC Managed Care – PPO | Admitting: Internal Medicine

## 2018-07-17 DEATH — deceased

## 2019-03-27 IMAGING — CT CT ABD-PELV W/ CM
2 of 5 series · 15 of 46 positions shown, 17 images · IV contrast (APPLIED)
Comparison: Chest CT 11/05/2017.

Abdominopelvic CT of 01/28/2016.

CLINICAL DATA: Recently diagnosed with lung cancer and brain
metastasis, status post radiation therapy.

EXAM:
CT ABDOMEN AND PELVIS WITH CONTRAST
TECHNIQUE: Multidetector CT imaging of the abdomen and pelvis was performed
using the standard protocol following bolus administration of
intravenous contrast.
CONTRAST:  100mL OMNIPAQUE IOHEXOL 300 MG/ML  SOLN

[Series 2: axial (person_name) (person_name) · axial · 0.73mm/px · z∈[-994,-594]mm · 12 of 90 slices shown, 14 images]
[im 5/90  soft-tissue]
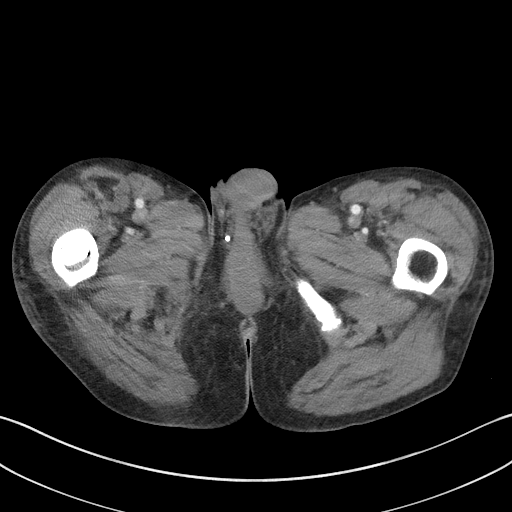
[im 5/90  bone]
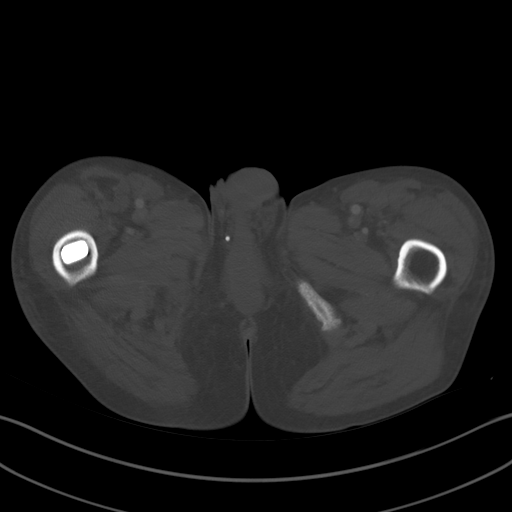
[im 15/90  soft-tissue]
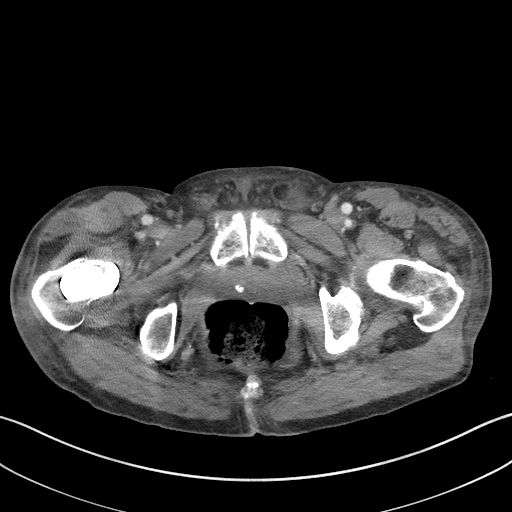
[im 20/90  soft-tissue]
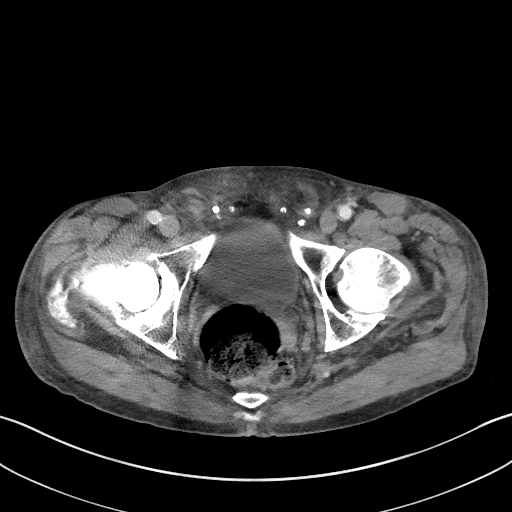
[im 25/90  soft-tissue]
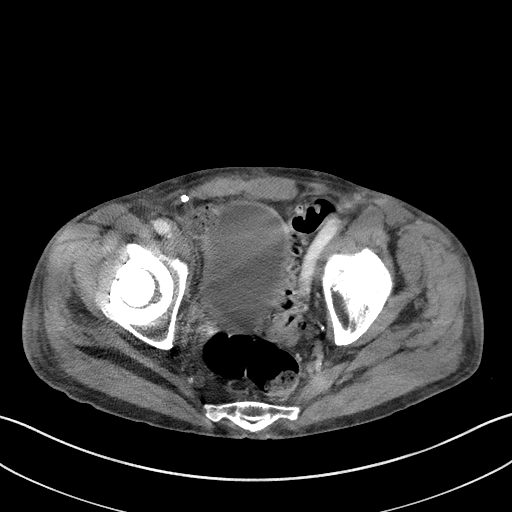
[im 35/90  soft-tissue]
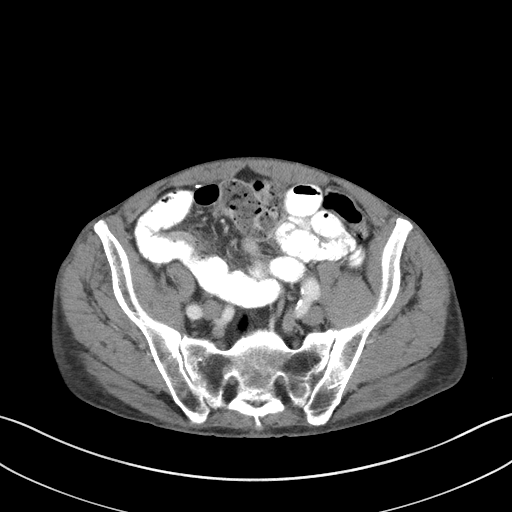
[im 40/90  soft-tissue]
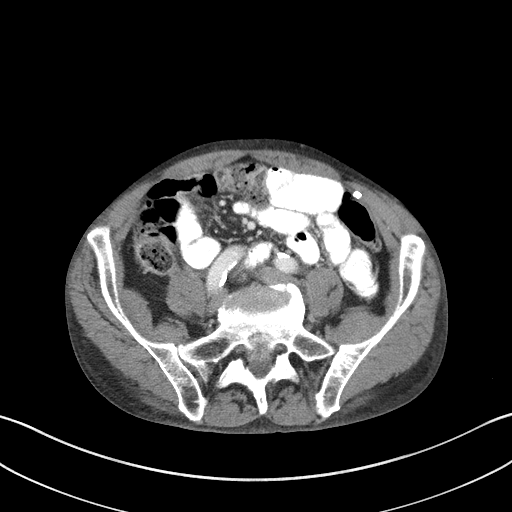
[im 50/90  soft-tissue]
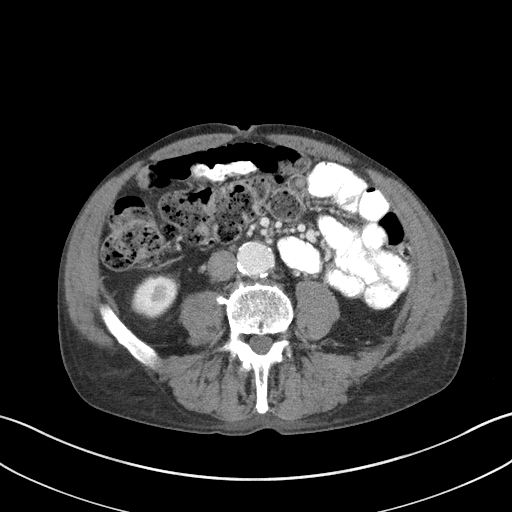
[im 55/90  soft-tissue]
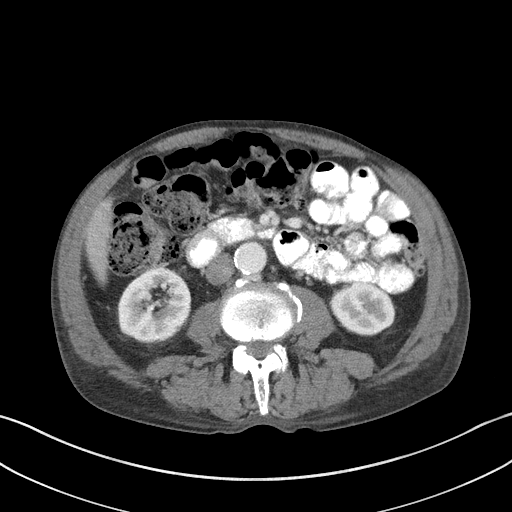
[im 65/90  soft-tissue]
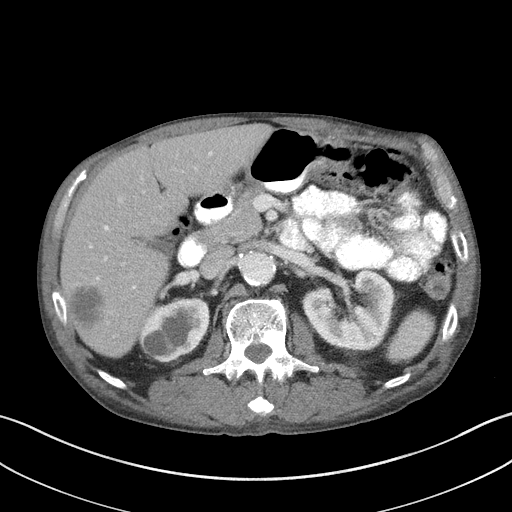
[im 65/90  bone]
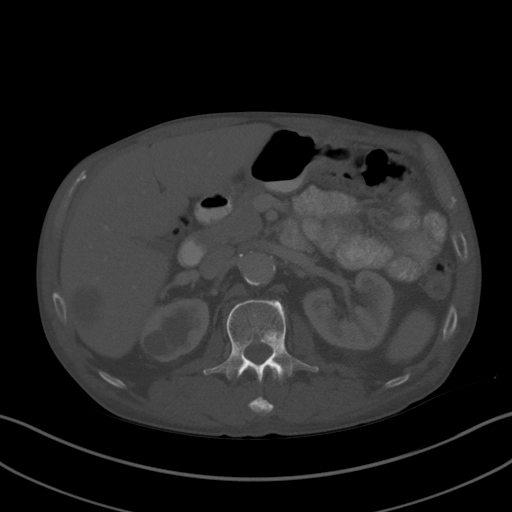
[im 70/90  soft-tissue]
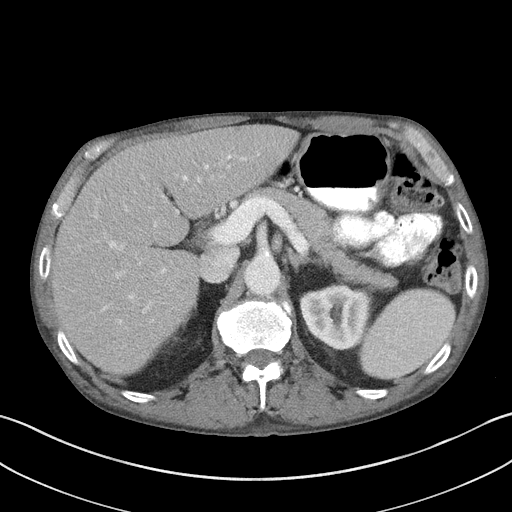
[im 75/90  soft-tissue]
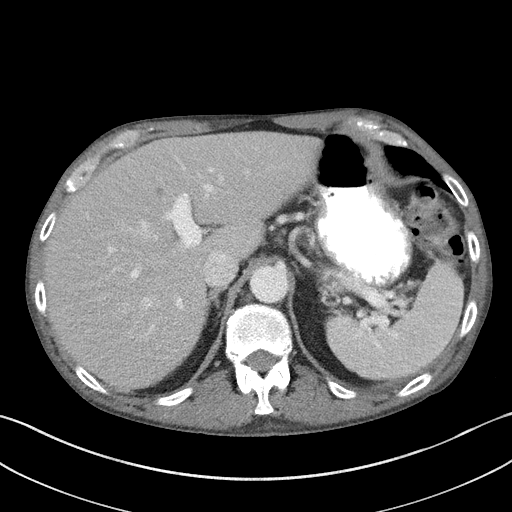
[im 85/90  soft-tissue]
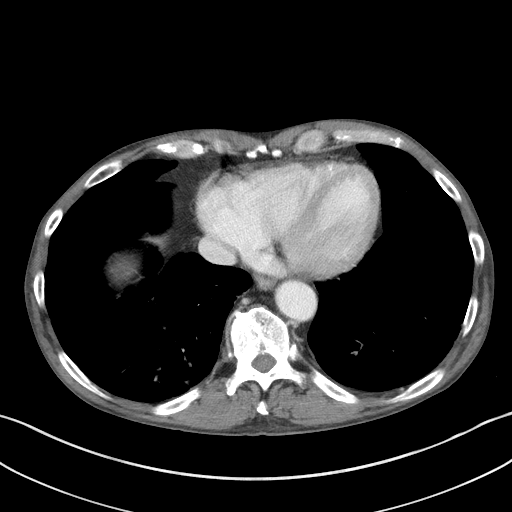

[Series 5: coronal st · coronal · 0.68mm/px · 3 of 76 slices shown]
[im 26/76  soft-tissue]
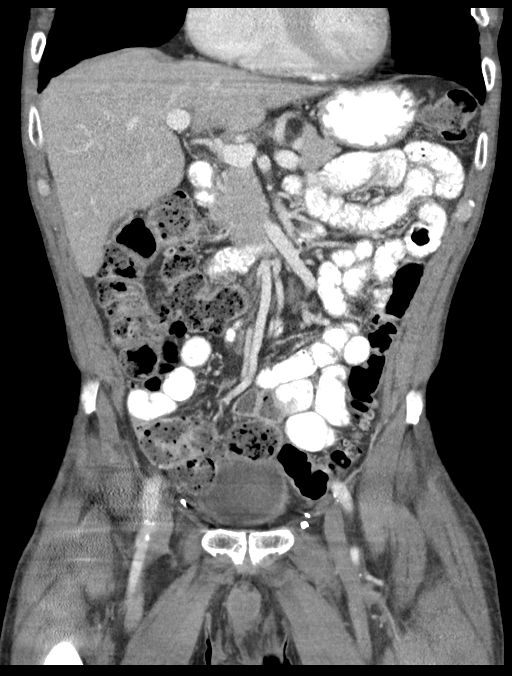
[im 34/76  soft-tissue]
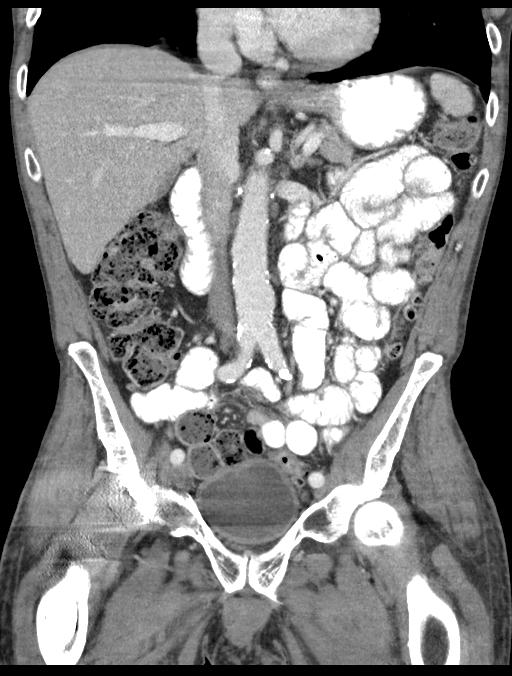
[im 42/76  soft-tissue]
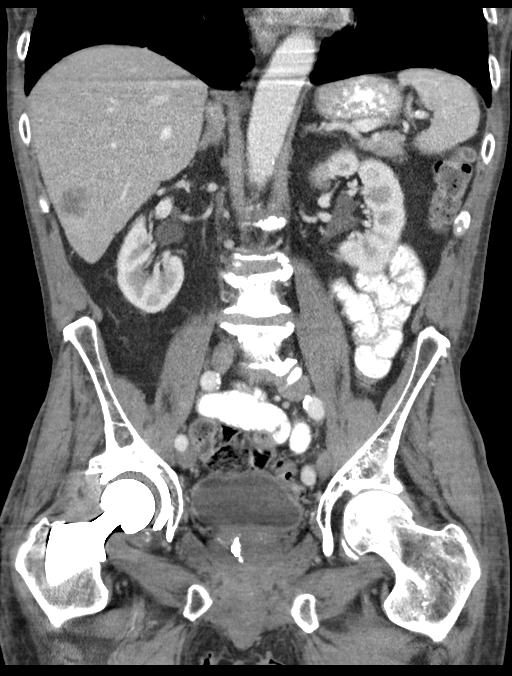

[15 of 46 positions shown; findings below may reference images not displayed]

FINDINGS: Lower chest: Right greater than left patchy airspace disease. These
are felt to be similar to 11/05/2017. Normal heart size without
pericardial or pleural effusion. Normal, without mass or ductal
dilatation.

Hepatobiliary: Bilateral low-density liver lesions. These are
primarily too small to characterize but felt to be new compared to
01/28/2016. A dominant right hepatic lobe 3.1 x 2.0 cm lesion on
[DATE] is new since that exam, consistent with metastatic disease.
Immediately anterior inferior 1.4 cm lesion is also new and
consistent with metastasis. Normal gallbladder, without biliary
ductal dilatation.

Pancreas: Normal, without mass or ductal dilatation.

Spleen: Normal in size, without focal abnormality.

Adrenals/Urinary Tract: Normal adrenal glands. Upper pole right
renal cysts. Too small to characterize left renal lesions. No
hydronephrosis. Degraded evaluation of the pelvis, secondary to beam
hardening artifact from right hip arthroplasty. Grossly normal
urinary bladder.

Stomach/Bowel: Normal stomach, without wall thickening. Scattered
colonic diverticula. Normal terminal ileum and appendix. Normal
small bowel.

Vascular/Lymphatic: Aortic and branch vessel atherosclerosis.
Circumaortic left renal vein. No abdominopelvic adenopathy.

Reproductive: Normal prostate.

Other: No significant free fluid. No evidence of omental or
peritoneal disease. Prior extensive ventral and inguinal hernia
repairs. Suspect residual or recurrent tiny fat containing left
inguinal hernia.

Musculoskeletal: Right hip arthroplasty. Sclerotic lesions within
the L2 and L3 vertebral bodies are new since 01/28/2016.

Disc bulges at L4-5 and L5-S1.
IMPRESSION: 1. Metastatic disease within the liver and bones, as detailed above.
2.  Aortic Atherosclerosis ([id number]).

## 2019-06-29 IMAGING — DX DG CHEST 1V PORT
1 series · 1 of 1 positions shown · non-contrast
Comparison: Chest radiograph from one day prior.

CLINICAL DATA: Cough, metastatic lung cancer

EXAM:
PORTABLE CHEST 1 VIEW

[chest ap]
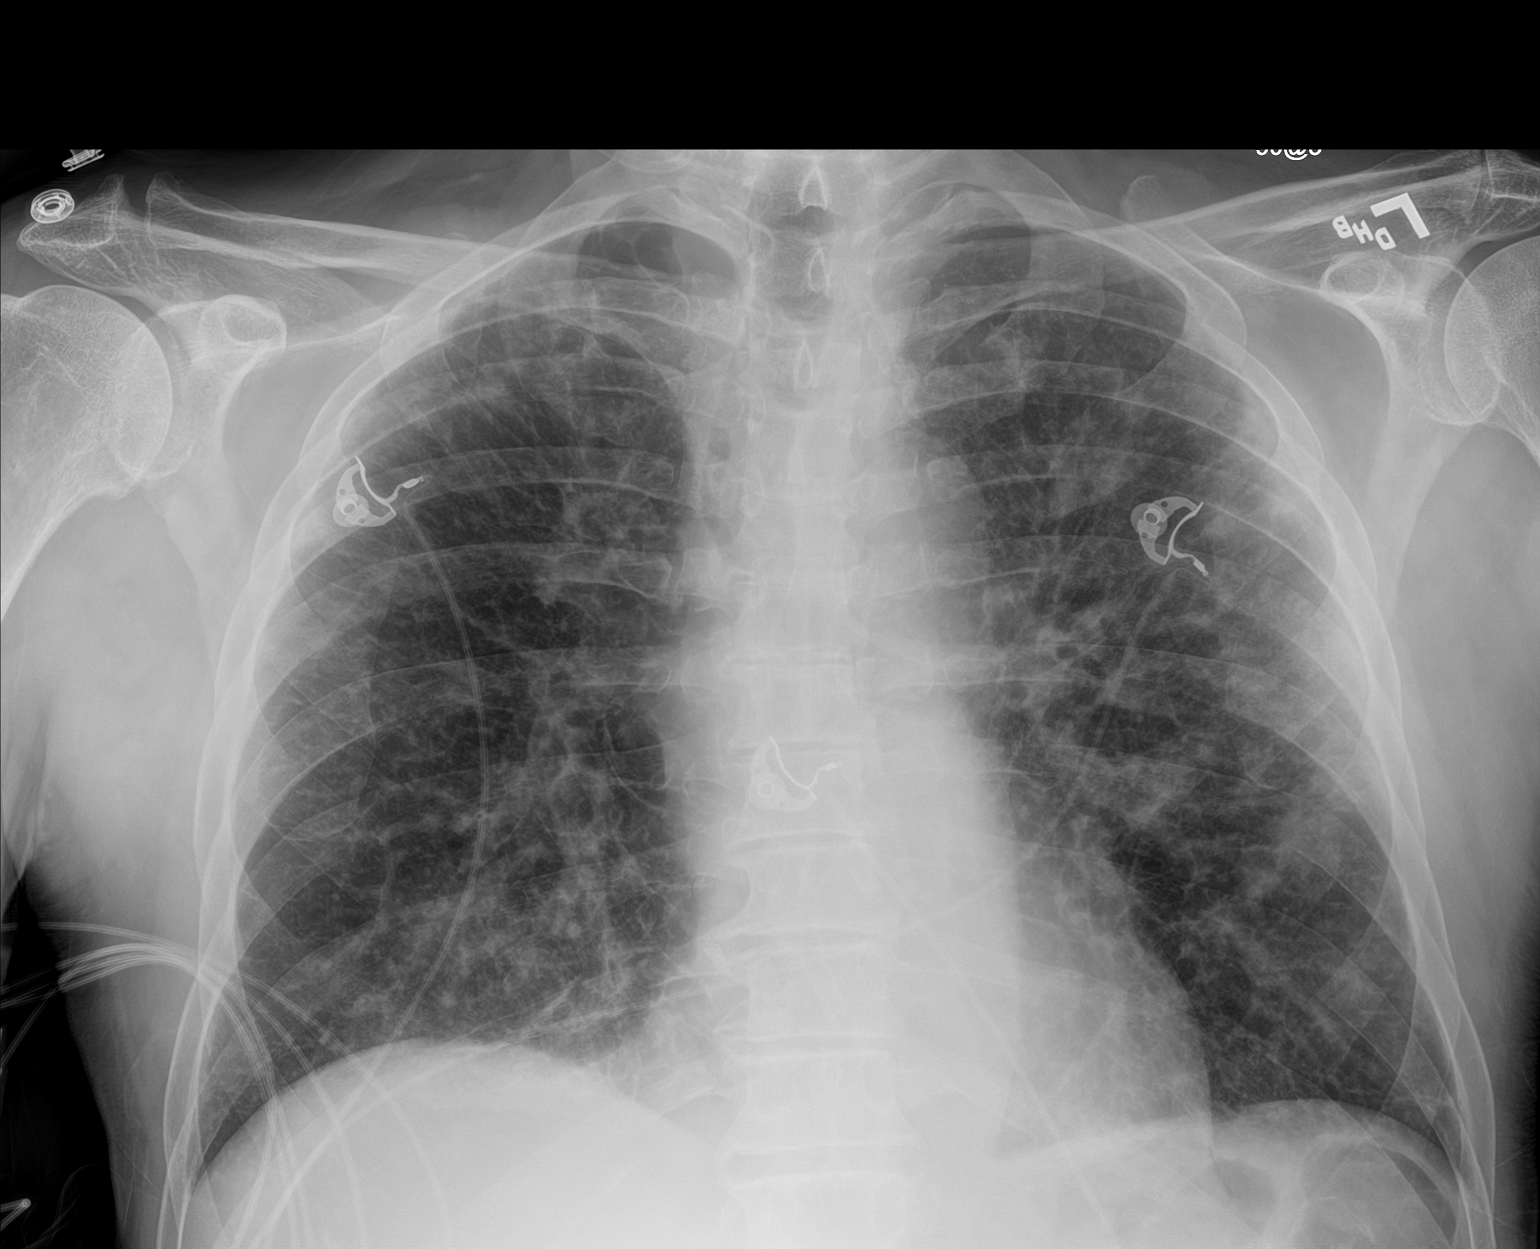

[1 of 1 positions shown; findings below may reference images not displayed]

FINDINGS: Stable cardiomediastinal silhouette with normal heart size. No
pneumothorax. No pleural effusion. Extensive patchy reticulonodular
opacities throughout both lungs have not appreciably changed. No
acute superimposed consolidative airspace disease.
IMPRESSION: Stable extensive patchy reticulonodular opacities throughout both
lungs, favor lymphangitic tumor. No acute superimposed consolidative
airspace disease.

## 2019-07-27 IMAGING — CR DG CHEST 2V
1 series · 2 of 2 positions shown · non-contrast
Comparison: 03/30/2018

CLINICAL DATA: Stage IV lung cancer of right upper lobe with mets
to liver and brain. Hx of COPD and collapsed lung- [id number]. Admitted to
hospital 03/24/2018 with fevers and hypoxic respiratory failure.
Former smoker.

EXAM:
CHEST - 2 VIEW

[Series 1: dg chest 2 view · 0.14mm/px · 2 of 2 slices shown]
[im 1/2]
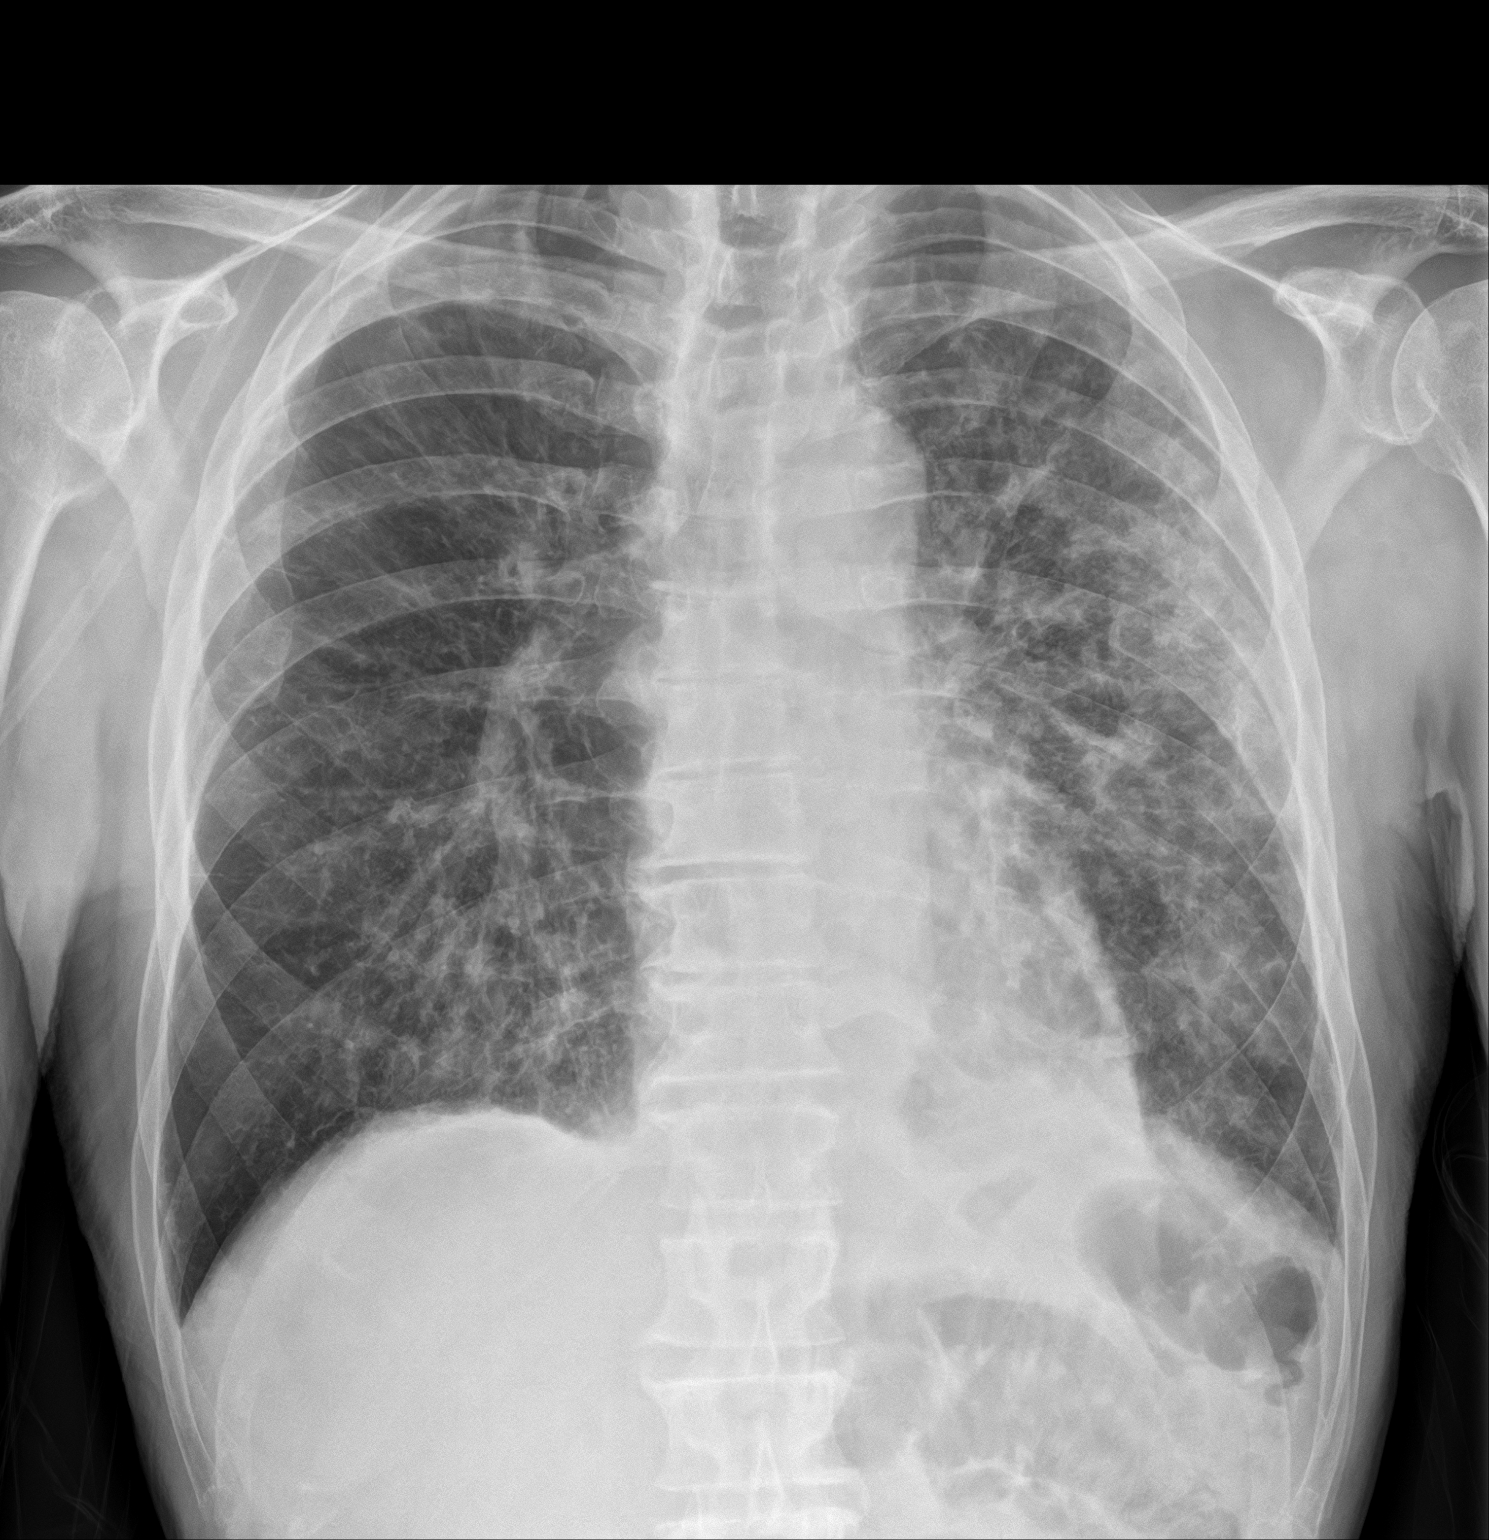
[im 2/2]
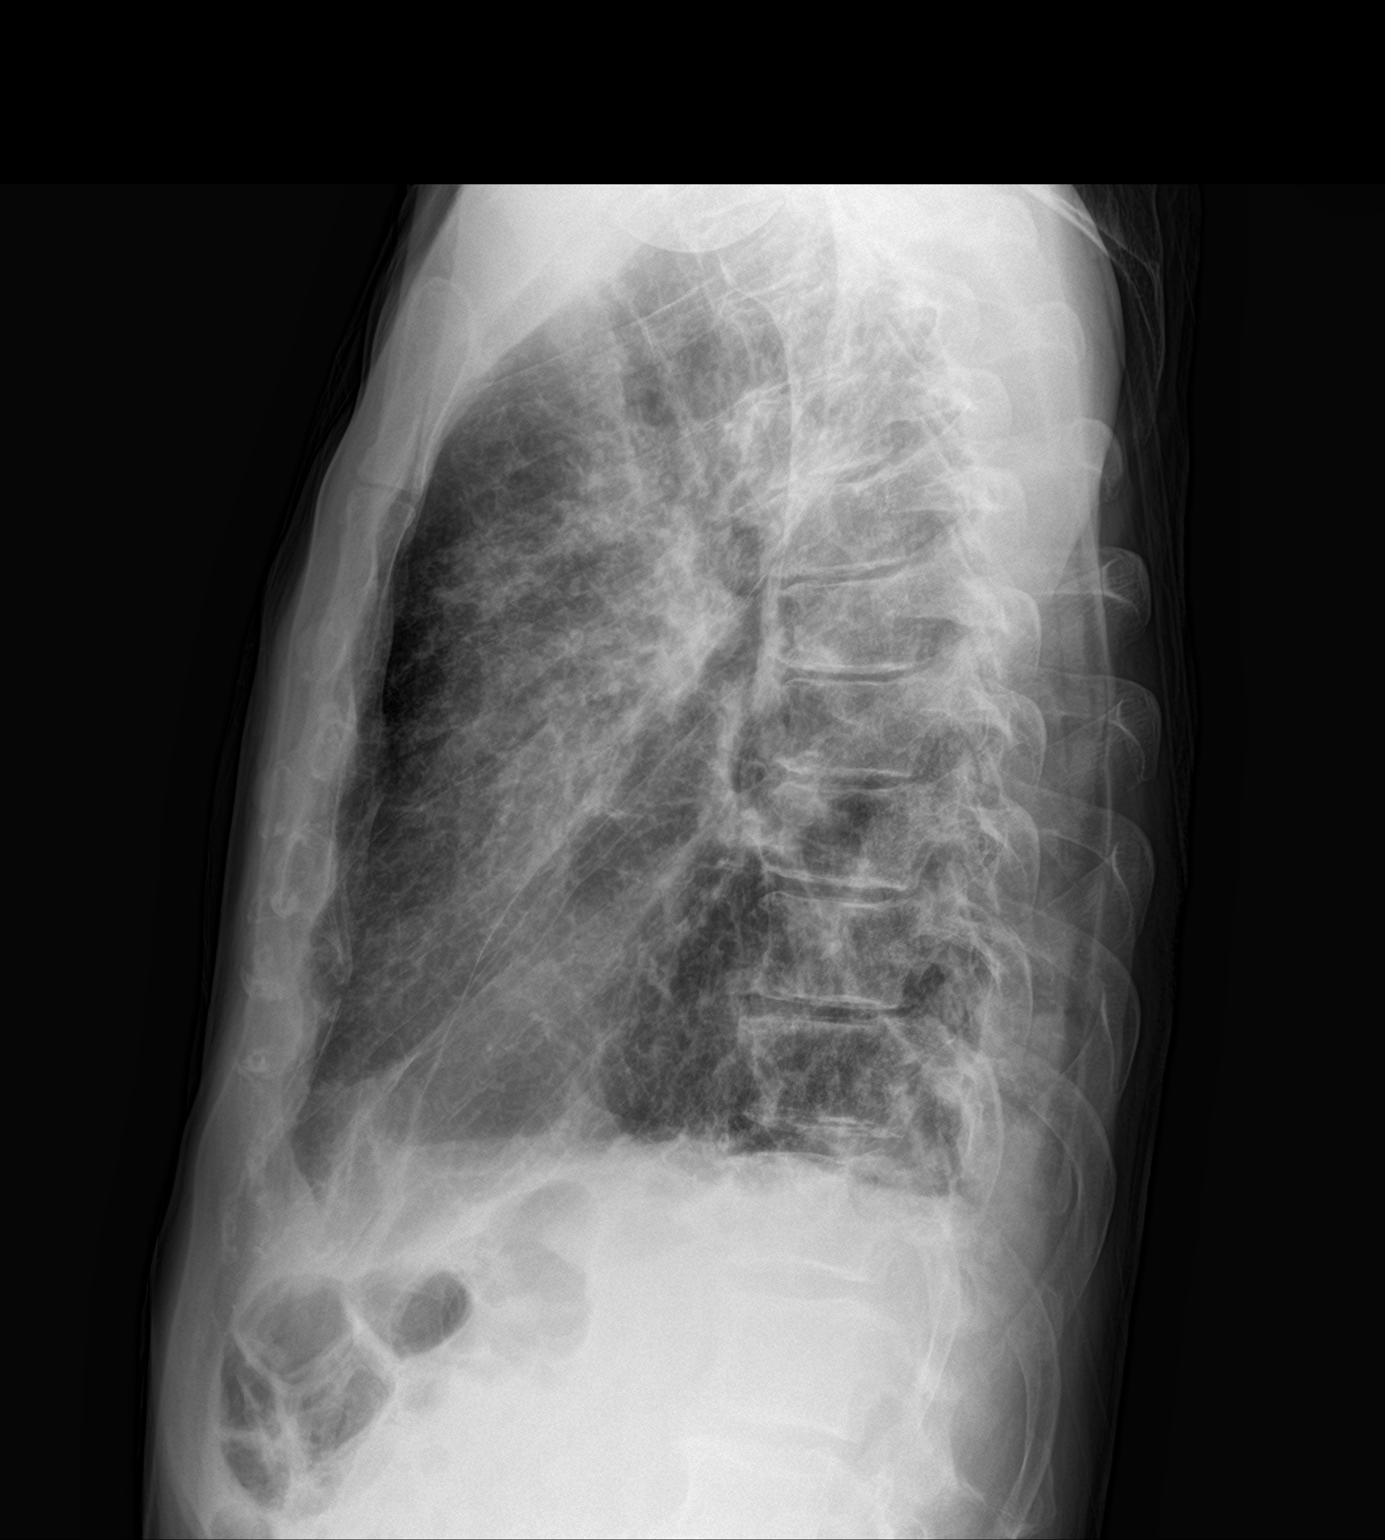

[2 of 2 positions shown; findings below may reference images not displayed]

FINDINGS: Coarse somewhat nodular airspace opacities throughout anterior left
upper lobe and lingula, left lung base, and in the right infrahilar
region, with slight worsening since previous exam.

Heart size and mediastinal contours are within normal limits.

There is blunting of posterior costophrenic angles suggesting small
effusions.

Visualized bones unremarkable.
IMPRESSION: 1. Slight worsening of asymmetric airspace disease.
2. Possible small pleural effusions.

## 2019-08-02 IMAGING — CR DG CHEST 2V
2 series · 2 of 2 positions shown · non-contrast
Comparison: 04/02/2018

CLINICAL DATA: Stage IV lung cancer

EXAM:
CHEST - 2 VIEW

[chest lat]
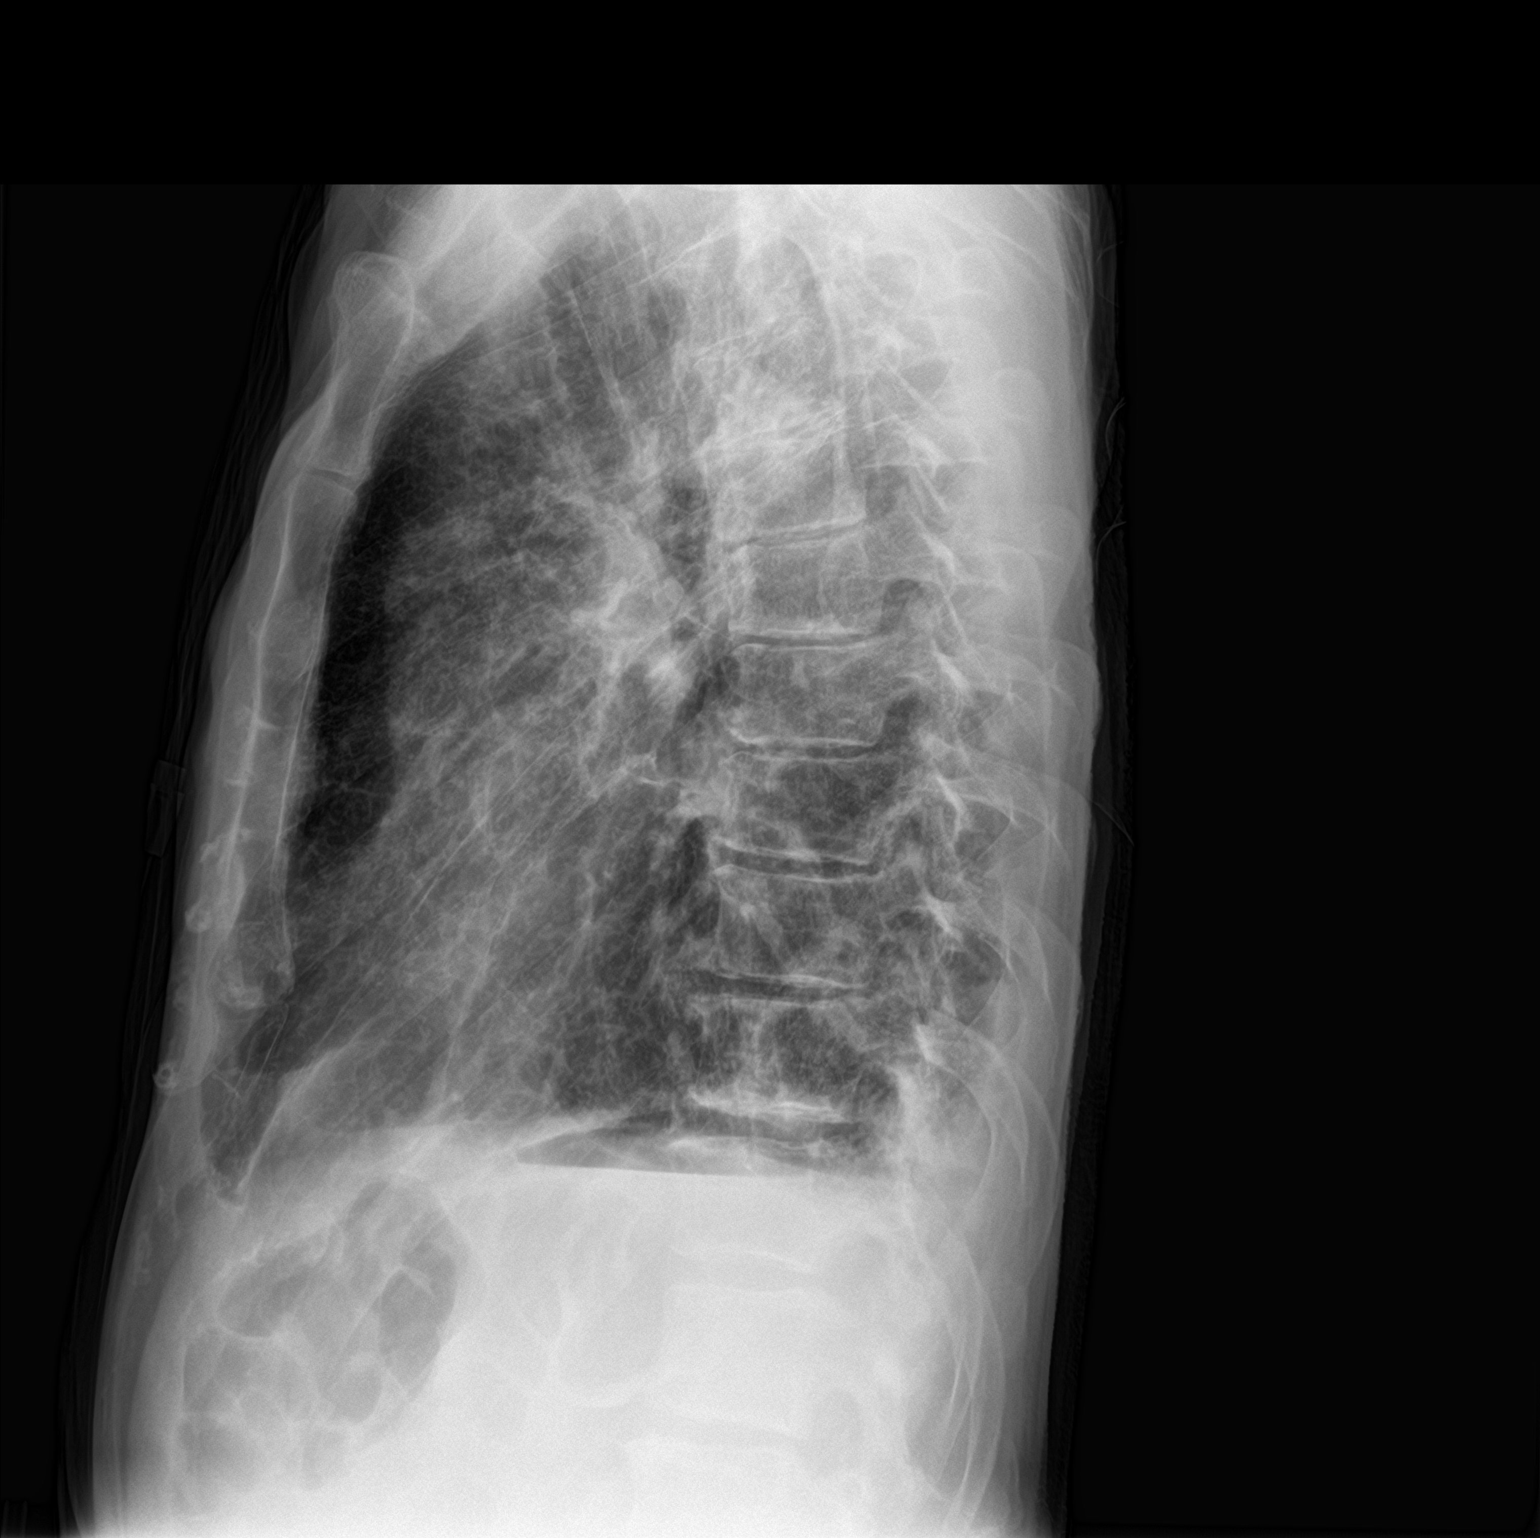

[chest ap]
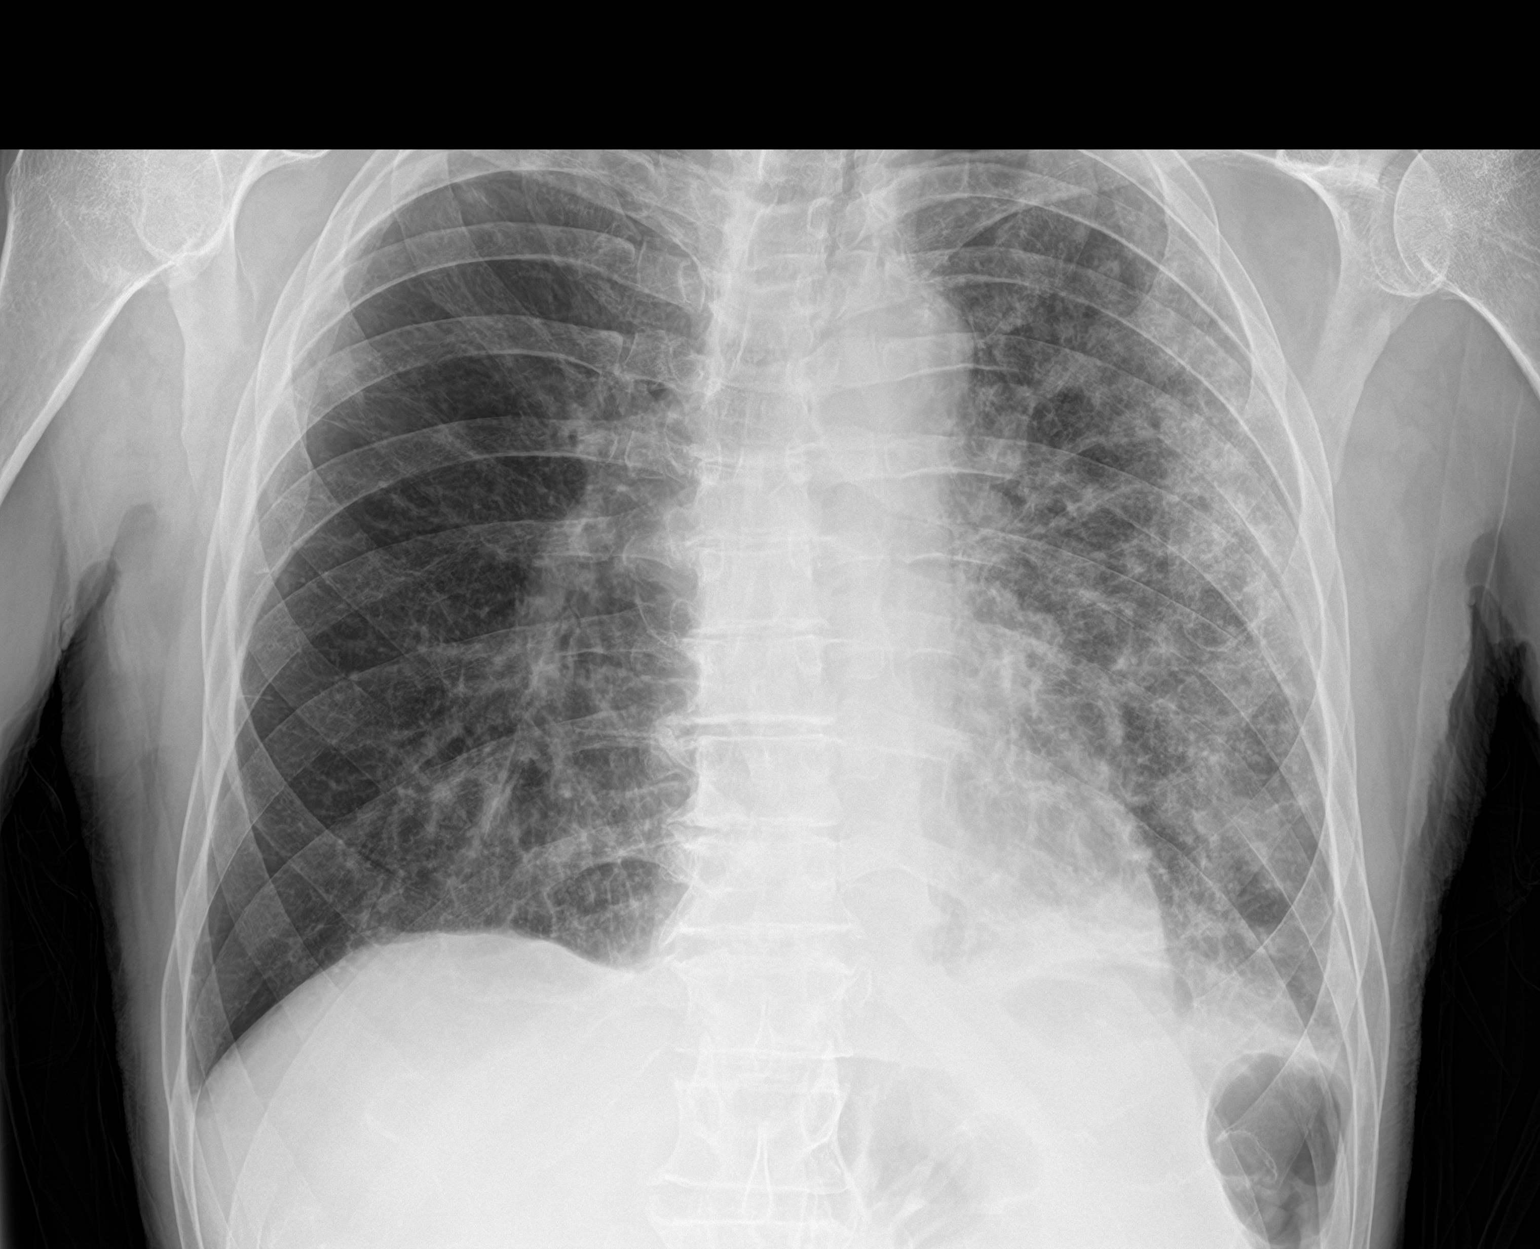

[2 of 2 positions shown; findings below may reference images not displayed]

FINDINGS: Diffuse left lung airspace disease and patchy right lung opacities
again noted, unchanged. Heart is normal size. Underlying COPD. No
visible effusions or acute bony abnormality.
IMPRESSION: Stable asymmetric airspace disease, left greater than right.
